# Patient Record
Sex: Male | Born: 1960
Health system: Southern US, Community
[De-identification: ages and names within clinical notes are randomized; demographics above are authoritative.]

## PROBLEM LIST (undated history)

## (undated) DIAGNOSIS — K922 Gastrointestinal hemorrhage, unspecified: Secondary | ICD-10-CM

## (undated) DIAGNOSIS — T8859XA Other complications of anesthesia, initial encounter: Secondary | ICD-10-CM

## (undated) DIAGNOSIS — M109 Gout, unspecified: Secondary | ICD-10-CM

## (undated) DIAGNOSIS — E7800 Pure hypercholesterolemia, unspecified: Secondary | ICD-10-CM

## (undated) DIAGNOSIS — I700 Atherosclerosis of aorta: Secondary | ICD-10-CM

## (undated) DIAGNOSIS — T7840XA Allergy, unspecified, initial encounter: Secondary | ICD-10-CM

## (undated) DIAGNOSIS — I2510 Atherosclerotic heart disease of native coronary artery without angina pectoris: Secondary | ICD-10-CM

## (undated) DIAGNOSIS — K5790 Diverticulosis of intestine, part unspecified, without perforation or abscess without bleeding: Secondary | ICD-10-CM

## (undated) HISTORY — DX: Gastrointestinal hemorrhage, unspecified: K92.2

## (undated) HISTORY — DX: Allergy, unspecified, initial encounter: T78.40XA

## (undated) HISTORY — DX: Diverticulosis of intestine, part unspecified, without perforation or abscess without bleeding: K57.90

## (undated) HISTORY — PX: ANKLE ARTHROSCOPY: SUR85

## (undated) HISTORY — PX: KNEE ARTHROSCOPY: SHX127

## (undated) HISTORY — DX: Atherosclerosis of aorta: I70.0

## (undated) HISTORY — PX: CARDIAC CATHETERIZATION: SHX172

## (undated) HISTORY — PX: KNEE ARTHROSCOPY: SUR90

---

## 2001-07-02 ENCOUNTER — Encounter: Payer: Self-pay | Admitting: Geriatric Medicine

## 2001-07-02 ENCOUNTER — Ambulatory Visit (HOSPITAL_COMMUNITY): Admission: RE | Admit: 2001-07-02 | Discharge: 2001-07-02 | Payer: Self-pay | Admitting: Geriatric Medicine

## 2004-09-03 ENCOUNTER — Ambulatory Visit: Payer: Self-pay

## 2004-09-03 ENCOUNTER — Ambulatory Visit: Payer: Self-pay | Admitting: Cardiology

## 2007-04-03 ENCOUNTER — Emergency Department (HOSPITAL_COMMUNITY): Admission: EM | Admit: 2007-04-03 | Discharge: 2007-04-03 | Payer: Self-pay | Admitting: Family Medicine

## 2010-12-16 ENCOUNTER — Inpatient Hospital Stay (INDEPENDENT_AMBULATORY_CARE_PROVIDER_SITE_OTHER)
Admission: RE | Admit: 2010-12-16 | Discharge: 2010-12-16 | Disposition: A | Discharge status: Home / Self Care | Payer: 59 | Source: Ambulatory Visit | Attending: Family Medicine | Admitting: Family Medicine

## 2010-12-16 DIAGNOSIS — S61209A Unspecified open wound of unspecified finger without damage to nail, initial encounter: Secondary | ICD-10-CM

## 2011-05-17 ENCOUNTER — Ambulatory Visit
Admission: RE | Admit: 2011-05-17 | Discharge: 2011-05-17 | Disposition: A | Discharge status: Home / Self Care | Payer: 59 | Source: Ambulatory Visit | Attending: Orthopedic Surgery | Admitting: Orthopedic Surgery

## 2011-05-17 ENCOUNTER — Other Ambulatory Visit: Payer: Self-pay | Admitting: Orthopedic Surgery

## 2011-05-17 DIAGNOSIS — M75100 Unspecified rotator cuff tear or rupture of unspecified shoulder, not specified as traumatic: Secondary | ICD-10-CM

## 2011-05-17 DIAGNOSIS — M25511 Pain in right shoulder: Secondary | ICD-10-CM

## 2012-11-05 ENCOUNTER — Emergency Department (HOSPITAL_COMMUNITY): Payer: 59

## 2012-11-05 ENCOUNTER — Encounter (HOSPITAL_COMMUNITY): Payer: Self-pay | Admitting: Emergency Medicine

## 2012-11-05 ENCOUNTER — Emergency Department (HOSPITAL_COMMUNITY)
Admission: EM | Admit: 2012-11-05 | Discharge: 2012-11-05 | Disposition: A | Discharge status: Home / Self Care | Payer: 59 | Attending: Emergency Medicine | Admitting: Emergency Medicine

## 2012-11-05 DIAGNOSIS — M109 Gout, unspecified: Secondary | ICD-10-CM | POA: Insufficient documentation

## 2012-11-05 DIAGNOSIS — M25579 Pain in unspecified ankle and joints of unspecified foot: Secondary | ICD-10-CM | POA: Insufficient documentation

## 2012-11-05 DIAGNOSIS — Z862 Personal history of diseases of the blood and blood-forming organs and certain disorders involving the immune mechanism: Secondary | ICD-10-CM | POA: Insufficient documentation

## 2012-11-05 DIAGNOSIS — Z8639 Personal history of other endocrine, nutritional and metabolic disease: Secondary | ICD-10-CM | POA: Insufficient documentation

## 2012-11-05 DIAGNOSIS — M25571 Pain in right ankle and joints of right foot: Secondary | ICD-10-CM

## 2012-11-05 HISTORY — DX: Gout, unspecified: M10.9

## 2012-11-05 HISTORY — DX: Pure hypercholesterolemia, unspecified: E78.00

## 2012-11-05 LAB — CBC WITH DIFFERENTIAL/PLATELET
Basophils Absolute: 0 10*3/uL (ref 0.0–0.1)
Basophils Relative: 0 % (ref 0–1)
Eosinophils Absolute: 0.1 10*3/uL (ref 0.0–0.7)
Eosinophils Relative: 1 % (ref 0–5)
HCT: 45.1 % (ref 39.0–52.0)
Hemoglobin: 16.2 g/dL (ref 13.0–17.0)
Lymphocytes Relative: 16 % (ref 12–46)
Lymphs Abs: 1.7 10*3/uL (ref 0.7–4.0)
MCH: 31 pg (ref 26.0–34.0)
MCHC: 35.9 g/dL (ref 30.0–36.0)
MCV: 86.2 fL (ref 78.0–100.0)
Monocytes Absolute: 0.8 10*3/uL (ref 0.1–1.0)
Monocytes Relative: 7 % (ref 3–12)
Neutro Abs: 8 10*3/uL — ABNORMAL HIGH (ref 1.7–7.7)
Neutrophils Relative %: 75 % (ref 43–77)
Platelets: 356 10*3/uL (ref 150–400)
RBC: 5.23 MIL/uL (ref 4.22–5.81)
RDW: 12.1 % (ref 11.5–15.5)
WBC: 10.6 10*3/uL — ABNORMAL HIGH (ref 4.0–10.5)

## 2012-11-05 LAB — BASIC METABOLIC PANEL
BUN: 15 mg/dL (ref 6–23)
Calcium: 9.7 mg/dL (ref 8.4–10.5)
GFR calc Af Amer: >90 mL/min (ref >90)
GFR calc non Af Amer: >90 mL/min (ref >90)
Glucose, Bld: 95 mg/dL (ref 70–99)

## 2012-11-05 LAB — URIC ACID: Uric Acid, Serum: 6.8 mg/dL (ref 4.0–7.8)

## 2012-11-05 MED ORDER — ONDANSETRON HCL 4 MG PO TABS: 4.0000 mg | ORAL_TABLET | Freq: Four times a day (QID) | ORAL | Status: DC

## 2012-11-05 MED ORDER — INDOMETHACIN 25 MG PO CAPS
50.0000 mg | ORAL_CAPSULE | Freq: Once | ORAL | Status: AC
  Administered 2012-11-05: 50 mg via ORAL
  Filled 2012-11-05: qty 2

## 2012-11-05 MED ORDER — INDOMETHACIN 25 MG PO CAPS: 50.0000 mg | ORAL_CAPSULE | Freq: Two times a day (BID) | ORAL | Status: DC

## 2012-11-05 MED ORDER — ONDANSETRON 4 MG PO TBDP
8.0000 mg | ORAL_TABLET | Freq: Once | ORAL | Status: AC
  Administered 2012-11-05: 8 mg via ORAL
  Filled 2012-11-05: qty 2

## 2012-11-05 NOTE — ED Provider Notes (Signed)
History     CSN: [id number]  Arrival date & time 11/05/12  0808   First MD Initiated Contact with Patient 11/05/12 0825      Chief Complaint  Patient presents with  . Foot Pain    (Consider location/radiation/quality/duration/timing/severity/associated sxs/prior treatment) HPI Comments: This is a 52 year old male who presents today with a week of right ankle pain. The pain has "peaks and valleys". This is the 3rd peak of the week. He has a past medical history of gout. He states this feels like gout. No history of trauma or any activity out of the ordinary. It is a deep throbbing pain in his ankle. He has tried taking Advil with has helped a little. He states taking a hot bath is what provides him with the most relief. Walking on his leg makes it worse and he is currently using crutches. No fever, chill, body aches, abdominal pain, nausea, vomiting, diarrhea, or headache. No pain in any other joint.   Patient is a 52 y.o. male presenting with lower extremity pain. The history is provided by the patient. No language interpreter was used.  Foot Pain This is a new problem. The current episode started in the past 7 days. The problem occurs constantly. The problem has been waxing and waning. Associated symptoms include joint swelling. Pertinent negatives include no abdominal pain, chest pain, coughing, diaphoresis, fever, headaches or numbness. The symptoms are aggravated by walking. He has tried NSAIDs, heat and ice for the symptoms. The treatment provided mild relief.    Past Medical History  Diagnosis Date  . Gout   . High cholesterol     Past Surgical History  Procedure Laterality Date  . Ankle arthroscopy      "had it cleaned out"  . Knee arthroscopy      History reviewed. No pertinent family history.  History  Substance Use Topics  . Smoking status: Never Smoker   . Smokeless tobacco: Not on file  . Alcohol Use: No      Review of Systems  Constitutional: Negative for  fever and diaphoresis.  Respiratory: Negative for cough.   Cardiovascular: Negative for chest pain.  Gastrointestinal: Negative for abdominal pain.  Musculoskeletal: Positive for joint swelling.  Neurological: Negative for numbness and headaches.    Allergies  Colchicine  Home Medications   Current Outpatient Rx  Name  Route  Sig  Dispense  Refill  . Acetaminophen (ACETAMIN PO)   Oral   Take 2 tablets by mouth every 6 (six) hours as needed (for pain).         Marland Kitchen ibuprofen (ADVIL,MOTRIN) 200 MG tablet   Oral   Take 800 mg by mouth every 6 (six) hours as needed for pain.           BP 123/74  Pulse 56  Temp(Src) 98.4 F (36.9 C)  Resp 16  SpO2 97%  Physical Exam  Nursing note and vitals reviewed. Constitutional: He is oriented to person, place, and time. Vital signs are normal. He appears well-developed and well-nourished. He is cooperative. He does not appear ill. No distress.  HENT:  Head: Normocephalic and atraumatic.  Right Ear: External ear normal.  Left Ear: External ear normal.  Nose: Nose normal.  Eyes: Conjunctivae and lids are normal.  Neck: Trachea normal, normal range of motion and phonation normal. No tracheal deviation present.  Cardiovascular: Normal rate, regular rhythm, normal heart sounds, intact distal pulses and normal pulses.  Exam reveals no gallop and no  friction rub.   No murmur heard. Pulmonary/Chest: Effort normal and breath sounds normal. No stridor. No respiratory distress.  Abdominal: Soft. Normal appearance. He exhibits no distension. There is no tenderness.  Musculoskeletal: He exhibits tenderness.       Right ankle: He exhibits decreased range of motion and swelling. He exhibits no ecchymosis, no deformity, no laceration and normal pulse. Tenderness (posterior to lateral malleolus ). Achilles tendon exhibits no defect and normal Thompson's test results.  Neurological: He is alert and oriented to person, place, and time. Coordination  normal.  Skin: Skin is warm and dry. He is not diaphoretic. No erythema.  Psychiatric: He has a normal mood and affect. His behavior is normal.    ED Course  Procedures (including critical care time)  Labs Reviewed  CBC WITH DIFFERENTIAL - Abnormal; Notable for the following:    WBC 10.6 (*)    Neutro Abs 8.0 (*)    All other components within normal limits  URIC ACID  BASIC METABOLIC PANEL   Dg Ankle Complete Right  11/05/2012  *RADIOLOGY REPORT*  Clinical Data: Foot pain  RIGHT ANKLE - COMPLETE 3+ VIEW  Comparison: None.  Findings: Four views of the right ankle submitted.  No acute fracture or subluxation.  Ankle mortise is preserved.  Mild spurring of the dorsal aspect and posterior aspect of the talus.  IMPRESSION: No acute fracture or subluxation.  Mild degenerative changes.   Original Report Authenticated By: Natasha Mead, M.D.      1. Ankle pain, right       MDM  Patient presents with 9/10 throbbing ankle pain. No trauma. Warm bath helps. Hx of gout - states it feels like his gout. XRay neg for acute pathology. CBC, BMP, uric acid WNL. Reassessed and pain was improved with indomethacin. Patient denied all narcotic pain medications. No concern for septic arthritis. Suspect gout. Sent home with indomethacin and told to follow up with PCP in the next 2 days. Return precautions given. Patient agrees with plan.        Mora Bellman, PA-C 11/05/12 [phone]

## 2012-11-05 NOTE — ED Notes (Signed)
Has hx gout type symptoms -- last Friday right ankle started hurting and swelling-- had gotten better, but got worse last night. Right ankle red/hot to touch

## 2012-11-06 NOTE — ED Provider Notes (Signed)
Medical screening examination/treatment/procedure(s) were performed by non-physician practitioner and as supervising physician I was immediately available for consultation/collaboration.   Shelda Jakes, MD 11/06/12 [phone]

## 2013-08-20 ENCOUNTER — Encounter: Payer: Self-pay | Admitting: Internal Medicine

## 2013-09-27 ENCOUNTER — Ambulatory Visit (AMBULATORY_SURGERY_CENTER): Payer: Self-pay

## 2013-09-27 VITALS — Ht 71.0 in | Wt 195.0 lb

## 2013-09-27 DIAGNOSIS — Z1211 Encounter for screening for malignant neoplasm of colon: Secondary | ICD-10-CM

## 2013-09-27 MED ORDER — MOVIPREP 100 G PO SOLR: 1.0000 | Freq: Once | ORAL | Status: DC

## 2013-10-02 ENCOUNTER — Encounter: Payer: Self-pay | Admitting: Internal Medicine

## 2013-10-11 ENCOUNTER — Encounter: Payer: 59 | Admitting: Internal Medicine

## 2013-10-19 ENCOUNTER — Encounter: Payer: 59 | Admitting: Internal Medicine

## 2013-10-24 ENCOUNTER — Encounter: Payer: 59 | Admitting: Internal Medicine

## 2013-12-05 ENCOUNTER — Encounter: Payer: 59 | Admitting: Internal Medicine

## 2014-05-03 ENCOUNTER — Encounter: Payer: 59 | Admitting: Internal Medicine

## 2014-09-04 ENCOUNTER — Telehealth: Payer: Self-pay | Admitting: *Deleted

## 2014-09-04 NOTE — Telephone Encounter (Signed)
[place] Night - Client TELEPHONE ADVICE RECORD [facility] Patient Name: Kevin Wallace Gender: Male DOB: [birth year] Age: 54 Y 1 M 6 D Return Phone Number: [phone] (Primary) Address: City/State/Zip: [place] [employer] [facility] [place] Night - Client Client Site [place] - Night Contact Type Call Caller Name [place] Phone Number [phone] Relationship To Patient Self Is this call to report lab results? No Call Type General Information Initial Comment Caller states that he will not be able to make it his appoinment tomorrow because he is sick. General Information Type Appointment Nurse Assessment Guidelines Guideline Title Affirmed Question Affirmed Notes Nurse Date/Time (Eastern Time) Disp. Time Eilene Ghazi Time) Disposition Final User 09/03/2014 7:34:57 PM General Information Provided Yes Jinger Neighbors After Care Instructions Given Call Event Type User Date / Time Description

## 2014-09-12 ENCOUNTER — Ambulatory Visit (AMBULATORY_SURGERY_CENTER): Payer: Self-pay

## 2014-09-12 VITALS — Ht 71.0 in | Wt 197.0 lb

## 2014-09-12 DIAGNOSIS — Z1211 Encounter for screening for malignant neoplasm of colon: Secondary | ICD-10-CM

## 2014-09-12 NOTE — Progress Notes (Signed)
No allergies to eggs or soy n o past problems with anesthesia (took a long time to wake up after arthroscopy) No home oxygen No diet/weight loss meds  Has email  Emmi instructions given for colonoscopy

## 2014-09-24 ENCOUNTER — Encounter: Payer: 59 | Admitting: Internal Medicine

## 2014-10-14 ENCOUNTER — Encounter: Payer: Self-pay | Admitting: Internal Medicine

## 2014-10-14 ENCOUNTER — Ambulatory Visit (AMBULATORY_SURGERY_CENTER): Payer: 59 | Admitting: Internal Medicine

## 2014-10-14 VITALS — BP 121/72 | HR 47 | Temp 97.7°F | Resp 18 | Ht 71.0 in | Wt 197.0 lb

## 2014-10-14 DIAGNOSIS — D123 Benign neoplasm of transverse colon: Secondary | ICD-10-CM

## 2014-10-14 DIAGNOSIS — Z1211 Encounter for screening for malignant neoplasm of colon: Secondary | ICD-10-CM

## 2014-10-14 HISTORY — PX: COLONOSCOPY: SHX174

## 2014-10-14 MED ORDER — SODIUM CHLORIDE 0.9 % IV SOLN: 500.0000 mL | INTRAVENOUS | Status: DC

## 2014-10-14 NOTE — Progress Notes (Signed)
Report to PACU, RN, vss, BBS= Clear.  

## 2014-10-14 NOTE — Op Note (Signed)
[place]  [address] [place], [id number]   COLONOSCOPY PROCEDURE REPORT  PATIENT: Kevin Wallace, Kevin Wallace  MR#: [id number] BIRTHDATE: 01/07/1961 , 53  yrs. old GENDER: male ENDOSCOPIST: Jerene Bears, MD REFERRED BY:W.  Lutricia Feil, M.D. PROCEDURE DATE:  10/14/2014 PROCEDURE:   Colonoscopy with snare polypectomy First Screening Colonoscopy - Avg.  risk and is 50 yrs.  old or older Yes.  Prior Negative Screening - Now for repeat screening. N/A  History of Adenoma - Now for follow-up colonoscopy & has been > or = to 3 yrs.  N/A  Polyps Removed Today? Yes. ASA CLASS:   Class II INDICATIONS:average risk patient for colon cancer and 1st colonoscopy. MEDICATIONS: Monitored anesthesia care and Propofol 200 mg IV  DESCRIPTION OF PROCEDURE:   After the risks benefits and alternatives of the procedure were thoroughly explained, informed consent was obtained.  The digital rectal exam revealed no rectal mass.   The LB [id number]  endoscope was introduced through the anus and advanced to the cecum, which was identified by both the appendix and ileocecal valve. No adverse events experienced. The quality of the prep was good, using MoviPrep  The instrument was then slowly withdrawn as the colon was fully examined.   COLON FINDINGS: A sessile polyp measuring 5 mm in size was found in the transverse colon.  A polypectomy was performed with a cold snare.  The resection was complete, the polyp tissue was completely retrieved and sent to histology.   There was moderate to severe diverticulosis noted throughout the entire examined colon most significant in the left colon with associated muscular hypertrophy. Retroflexed views revealed small internal hemorrhoids. The time to cecum=4 minutes 17 seconds.  Withdrawal time=11 minutes 04 seconds. The scope was withdrawn and the procedure completed. COMPLICATIONS: There were no immediate complications.  ENDOSCOPIC IMPRESSION: 1.    Sessile polyp was found in the transverse colon; polypectomy was performed with a cold snare 2.   There was moderate diverticulosis noted throughout the entire examined colon  RECOMMENDATIONS: 1.  Await pathology results 2.  High fiber diet 3.  If the polyp removed today is proven to be an adenomatous (pre-cancerous) polyp, you will need a repeat colonoscopy in 5 years.  Otherwise you should continue to follow colorectal cancer screening guidelines for "routine risk" patients with colonoscopy in 10 years.  You will receive a letter within 1-2 weeks with the results of your biopsy as well as final recommendations.  Please call my office if you have not received a letter after 3 weeks.  eSigned:  Jerene Bears, MD 10/14/2014 9:58 AM  cc: Janalyn Rouse, MD and The Patient

## 2014-10-14 NOTE — Patient Instructions (Signed)
YOU HAD AN ENDOSCOPIC PROCEDURE TODAY AT [facility]:   Refer to the procedure report that was given to you for any specific questions about what was found during the examination.  If the procedure report does not answer your questions, please call your gastroenterologist to clarify.  If you requested that your care partner not be given the details of your procedure findings, then the procedure report has been included in a sealed envelope for you to review at your convenience later.  YOU SHOULD EXPECT: Some feelings of bloating in the abdomen. Passage of more gas than usual.  Walking can help get rid of the air that was put into your GI tract during the procedure and reduce the bloating. If you had a lower endoscopy (such as a colonoscopy or flexible sigmoidoscopy) you may notice spotting of blood in your stool or on the toilet paper. If you underwent a bowel prep for your procedure, you may not have a normal bowel movement for a few days.  Please Note:  You might notice some irritation and congestion in your nose or some drainage.  This is from the oxygen used during your procedure.  There is no need for concern and it should clear up in a day or so.  SYMPTOMS TO REPORT IMMEDIATELY:   Following lower endoscopy (colonoscopy or flexible sigmoidoscopy):  Excessive amounts of blood in the stool  Significant tenderness or worsening of abdominal pains  Swelling of the abdomen that is new, acute  Fever of 100F or higher   Following upper endoscopy (EGD)  Vomiting of blood or coffee ground material  New chest pain or pain under the shoulder blades  Painful or persistently difficult swallowing  New shortness of breath  Fever of 100F or higher  Black, tarry-looking stools  A gastroenterologist can be reached at any hour by calling [phone].   DIET: Your first meal following the procedure should be a small meal and then it is ok to progress to your normal diet. Heavy or  fried foods are harder to digest and may make you feel nauseous or bloated.  Likewise, meals heavy in dairy and vegetables can increase bloating.  Drink plenty of fluids but you should avoid alcoholic beverages for 24 hours.  ACTIVITY:  You should plan to take it easy for the rest of today and you should NOT DRIVE or use heavy machinery until tomorrow (because of the sedation medicines used during the test).    FOLLOW UP: Our staff will call the number listed on your records the next business day following your procedure to check on you and address any questions or concerns that you may have regarding the information given to you following your procedure. If we do not reach you, we will leave a message.  However, if you are feeling well and you are not experiencing any problems, there is no need to return our call.  We will assume that you have returned to your regular daily activities without incident.  If any biopsies were taken you will be contacted by phone or by letter within the next 1-3 weeks.  Please call us at [phone] if you have not heard about the biopsies in 3 weeks.    SIGNATURES/CONFIDENTIALITY: You and/or your care partner have signed paperwork which will be entered into your electronic medical record.  These signatures attest to the fact that that the information above on your After Visit Summary has been reviewed and is understood.  Full  responsibility of the confidentiality of this discharge information lies with you and/or your care-partner.YOU HAD AN ENDOSCOPIC PROCEDURE TODAY AT [facility]:   Refer to the procedure report that was given to you for any specific questions about what was found during the examination.  If the procedure report does not answer your questions, please call your gastroenterologist to clarify.  If you requested that your care partner not be given the details of your procedure findings, then the procedure report has been included in a  sealed envelope for you to review at your convenience later.  YOU SHOULD EXPECT: Some feelings of bloating in the abdomen. Passage of more gas than usual.  Walking can help get rid of the air that was put into your GI tract during the procedure and reduce the bloating. If you had a lower endoscopy (such as a colonoscopy or flexible sigmoidoscopy) you may notice spotting of blood in your stool or on the toilet paper. If you underwent a bowel prep for your procedure, you may not have a normal bowel movement for a few days.  Please Note:  You might notice some irritation and congestion in your nose or some drainage.  This is from the oxygen used during your procedure.  There is no need for concern and it should clear up in a day or so.  SYMPTOMS TO REPORT IMMEDIATELY:   Following lower endoscopy (colonoscopy or flexible sigmoidoscopy):  Excessive amounts of blood in the stool  Significant tenderness or worsening of abdominal pains  Swelling of the abdomen that is new, acute  Fever of 100F or higher    A gastroenterologist can be reached at any hour by calling [phone].   DIET: Your first meal following the procedure should be a small meal and then it is ok to progress to your normal diet. Heavy or fried foods are harder to digest and may make you feel nauseous or bloated.  Likewise, meals heavy in dairy and vegetables can increase bloating.  Drink plenty of fluids but you should avoid alcoholic beverages for 24 hours.  ACTIVITY:  You should plan to take it easy for the rest of today and you should NOT DRIVE or use heavy machinery until tomorrow (because of the sedation medicines used during the test).    FOLLOW UP: Our staff will call the number listed on your records the next business day following your procedure to check on you and address any questions or concerns that you may have regarding the information given to you following your procedure. If we do not reach you, we will leave a  message.  However, if you are feeling well and you are not experiencing any problems, there is no need to return our call.  We will assume that you have returned to your regular daily activities without incident.  If any biopsies were taken you will be contacted by phone or by letter within the next 1-3 weeks.  Please call us at [phone] if you have not heard about the biopsies in 3 weeks.    SIGNATURES/CONFIDENTIALITY: You and/or your care partner have signed paperwork which will be entered into your electronic medical record.  These signatures attest to the fact that that the information above on your After Visit Summary has been reviewed and is understood.  Full responsibility of the confidentiality of this discharge information lies with you and/or your care-partner.  Polyps, diverticulosis, and high fiber diet information given.

## 2014-10-14 NOTE — Progress Notes (Signed)
Called to room to assist during endoscopic procedure.  Patient ID and intended procedure confirmed with present staff. Received instructions for my participation in the procedure from the performing physician.  

## 2014-10-15 ENCOUNTER — Telehealth: Payer: Self-pay | Admitting: *Deleted

## 2014-10-15 NOTE — Telephone Encounter (Signed)
  Follow up Call-  Call back number 10/14/2014  Post procedure Call Back phone  # [phone]  Permission to leave phone message Yes     Patient questions:  Do you have a fever, pain , or abdominal swelling? No. Pain Score  0 *  Have you tolerated food without any problems? Yes.    Have you been able to return to your normal activities? Yes.    Do you have any questions about your discharge instructions: Diet   No. Medications  No. Follow up visit  No.  Do you have questions or concerns about your Care? No.  Actions: * If pain score is 4 or above: No action needed, pain <4.

## 2014-10-21 ENCOUNTER — Encounter: Payer: Self-pay | Admitting: Internal Medicine

## 2014-10-23 ENCOUNTER — Telehealth: Payer: Self-pay | Admitting: *Deleted

## 2014-10-24 NOTE — Telephone Encounter (Signed)
Entered in error

## 2015-05-20 ENCOUNTER — Telehealth: Payer: Self-pay | Admitting: Cardiology

## 2015-05-20 NOTE — Telephone Encounter (Signed)
Received records from [facility] for appointment on 06/16/15 with Dr Percival Spanish.  Records given to [facility] (medical records) for Dr Hochrein's schedule on 06/16/15. lp

## 2015-06-16 ENCOUNTER — Encounter: Payer: Self-pay | Admitting: Cardiology

## 2015-06-16 ENCOUNTER — Ambulatory Visit (INDEPENDENT_AMBULATORY_CARE_PROVIDER_SITE_OTHER): Payer: 59 | Admitting: Cardiology

## 2015-06-16 VITALS — BP 126/94 | HR 57 | Ht 71.0 in | Wt 199.7 lb

## 2015-06-16 DIAGNOSIS — R079 Chest pain, unspecified: Secondary | ICD-10-CM | POA: Diagnosis not present

## 2015-06-16 NOTE — Patient Instructions (Signed)
Your physician recommends that you schedule a follow-up appointment in: 1 Month  Your physician recommends that you have a Coronary Calcium Scoring Test this is done at our [facility]

## 2015-06-16 NOTE — Progress Notes (Signed)
Cardiology Office Note   Date:  06/16/2015   ID:  Kevin Wallace, DOB March 01, 1961, MRN [id number]  PCP:  Marton Redwood, MD  Cardiologist:   Minus Breeding, MD   Chief Complaint  Patient presents with  . Chest Pain      History of Present Illness: Kevin Wallace is a 54 y.o. male who presents for evaluation of chest pain. He reports that this has been going on for about 2 months. It is sporadic.  It has been stable pattern.  He says this is a 4 out of 10 discomfort. Might last for 5-10 minutes. He had bring it on with activities such as pushing his self propelled lawnmower. There are no associated symptoms such as nausea vomiting shortness of breath or diaphoresis. There is no radiation. He's never had this discomfort for. It comes and goes spontaneously. 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 He might do a little bit of walking. He walks when he coughs. He can bring on the symptoms.   Past Medical History  Diagnosis Date  . Gout   . High cholesterol     diet controlled    Past Surgical History  Procedure Laterality Date  . Ankle arthroscopy      "had it cleaned out"  . Knee arthroscopy       Current Outpatient Prescriptions  Medication Sig Dispense Refill  . Acetaminophen (ACETAMIN PO) Take 2 tablets by mouth every 6 (six) hours as needed (for pain).    Marland Kitchen ibuprofen (ADVIL,MOTRIN) 200 MG tablet Take 800 mg by mouth every 6 (six) hours as needed for pain.     No current facility-administered medications for this visit.    Allergies:   Colchicine    Social History:  The patient  reports that he has never smoked. He has never used smokeless tobacco. He reports that he does not drink alcohol or use illicit drugs.   Family History:  The patient's family history includes Rheumatic fever (age of onset: 65) in his father. There is no history of Colon cancer.    ROS:  Please see the history of present illness.   Otherwise, review of systems are  positive for fatigue and headaches.   All other systems are reviewed and negative.    PHYSICAL EXAM: VS:  BP 126/94 mmHg  Pulse 57  Ht 5\' 11"  (1.803 m)  Wt 199 lb 11.2 oz (90.583 kg)  BMI 27.86 kg/m2 , BMI Body mass index is 27.86 kg/(m^2). GENERAL:  Well appearing HEENT:  Pupils equal round and reactive, fundi not visualized, oral mucosa unremarkable NECK:  No jugular venous distention, waveform within normal limits, carotid upstroke brisk and symmetric, no bruits, no thyromegaly LYMPHATICS:  No cervical, inguinal adenopathy LUNGS:  Clear to auscultation bilaterally BACK:  No CVA tenderness CHEST:  Unremarkable HEART:  PMI not displaced or sustained,S1 and S2 within normal limits, no S3, no S4, no clicks, no rubs, no murmurs ABD:  Flat, positive bowel sounds normal in frequency in pitch, no bruits, no rebound, no guarding, no midline pulsatile mass, no hepatomegaly, no splenomegaly EXT:  2 plus pulses throughout, no edema, no cyanosis no clubbing SKIN:  No rashes no nodules NEURO:  Cranial nerves II through XII grossly intact, motor grossly intact throughout PSYCH:  Cognitively intact, oriented to person place and time    EKG:  EKG is ordered today. The ekg ordered today demonstrates sinus rhythm, rate 57, axis within normal limits, intervals  within normal limits, no acute ST-T wave changes.   Recent Labs: No results found for requested labs within last 365 days.    Lipid Panel No results found for: CHOL, TRIG, HDL, CHOLHDL, VLDL, LDLCALC, LDLDIRECT    Wt Readings from Last 3 Encounters:  06/16/15 199 lb 11.2 oz (90.583 kg)  10/14/14 197 lb (89.359 kg)  09/12/14 197 lb (89.359 kg)      Other studies Reviewed: Additional studies/ records that were reviewed today include: Office records with labs. Review of the above records demonstrates:  Please see elsewhere in the note.     ASSESSMENT AND PLAN:  CHEST PAIN:  I'm going to start with a coronary calcium score. If he  has elevated coronary calcium score he will at least get a stress perfusion study or possibly coronary CT angiogram. A lower score and I will risk stratify with a POET (Plain Old Exercise Treadmill)  DYSLIPIDEMIA:  His LDL was 157. HDL 42. Further therapy will be based on the results of coronary calcium score other workup.  RISK REDUCTION: We had a long discussion about exercise and diet.   Current medicines are reviewed at length with the patient today.  The patient does not have concerns regarding medicines.  The following changes have been made:  no change  Labs/ tests ordered today include:   Orders Placed This Encounter  Procedures  . CT Cardiac Scoring  . EKG 12-Lead     Disposition:   FU with me in about one mont.     Signed, Minus Breeding, MD  06/16/2015 8:29 AM     [facility]

## 2015-06-26 ENCOUNTER — Ambulatory Visit (INDEPENDENT_AMBULATORY_CARE_PROVIDER_SITE_OTHER)
Admission: RE | Admit: 2015-06-26 | Discharge: 2015-06-26 | Disposition: A | Discharge status: Home / Self Care | Payer: Self-pay | Source: Ambulatory Visit | Attending: Cardiology | Admitting: Cardiology

## 2015-06-26 DIAGNOSIS — R079 Chest pain, unspecified: Secondary | ICD-10-CM

## 2015-06-30 ENCOUNTER — Telehealth: Payer: Self-pay | Admitting: *Deleted

## 2015-06-30 DIAGNOSIS — R072 Precordial pain: Secondary | ICD-10-CM

## 2015-06-30 NOTE — Telephone Encounter (Signed)
-----   Message from Minus Breeding, MD sent at 06/27/2015 12:55 PM EST ----- Coronary calcium score was zero.  I am however going to order a POET (Plain Old Exercise Treadmill).  Call Mr. Diguglielmo with the results and send results to Marton Redwood, MD

## 2015-06-30 NOTE — Telephone Encounter (Signed)
Spoke with pt about his Coronary Calcium Score test, pt voice understanding  Order for GXT was place    Result send to pt PCP via epic

## 2015-07-01 ENCOUNTER — Telehealth (HOSPITAL_COMMUNITY): Payer: Self-pay

## 2015-07-01 NOTE — Telephone Encounter (Signed)
Encounter complete. 

## 2015-07-03 ENCOUNTER — Ambulatory Visit (HOSPITAL_COMMUNITY)
Admission: RE | Admit: 2015-07-03 | Discharge: 2015-07-03 | Disposition: A | Discharge status: Home / Self Care | Payer: 59 | Source: Ambulatory Visit | Attending: Cardiology | Admitting: Cardiology

## 2015-07-03 DIAGNOSIS — R072 Precordial pain: Secondary | ICD-10-CM | POA: Diagnosis present

## 2015-07-03 LAB — EXERCISE TOLERANCE TEST
CHL CUP MPHR: 167 {beats}/min
CHL CUP RESTING HR STRESS: 71 {beats}/min
CHL RATE OF PERCEIVED EXERTION: 16
CSEPHR: 99 %
Estimated workload: 13.4 METS
Exercise duration (min): 12 min
Peak HR: 166 {beats}/min

## 2015-07-08 ENCOUNTER — Telehealth: Payer: Self-pay | Admitting: Cardiology

## 2015-07-08 NOTE — Telephone Encounter (Signed)
Returning a call from today,he did not know who called him.

## 2015-07-08 NOTE — Telephone Encounter (Signed)
Results discussed, OV next week discussed. Pt verb'd understanding.

## 2015-07-14 ENCOUNTER — Ambulatory Visit (INDEPENDENT_AMBULATORY_CARE_PROVIDER_SITE_OTHER): Payer: 59 | Admitting: Cardiology

## 2015-07-14 ENCOUNTER — Encounter: Payer: Self-pay | Admitting: Cardiology

## 2015-07-14 VITALS — BP 122/80 | HR 52 | Ht 71.0 in | Wt 203.3 lb

## 2015-07-14 DIAGNOSIS — R079 Chest pain, unspecified: Secondary | ICD-10-CM | POA: Insufficient documentation

## 2015-07-14 DIAGNOSIS — R0789 Other chest pain: Secondary | ICD-10-CM

## 2015-07-14 NOTE — Patient Instructions (Signed)
Your physician recommends that you schedule a follow-up appointment in: As Needed    

## 2015-07-14 NOTE — Progress Notes (Signed)
Cardiology Office Note   Date:  07/14/2015   ID:  Kevin Wallace, DOB 1961/06/08, MRN [id number]  PCP:  Marton Redwood, MD  Cardiologist:   Minus Breeding, MD   No chief complaint on file.     History of Present Illness: Kevin Wallace is a 54 y.o. male who presents for evaluation of chest pain.  This was described in previous note. He's not really getting this any longer. He did have a  POET (Plain Old Exercise Treadmill) which was negative for any evidence of ischemia. 0 calcium on his calcium score. Bring him back to discuss his symptoms and also to discuss a significant hypertensive response he had with peak exercise which was 12 minutes into the test at the beginning of stage IV.   Past Medical History  Diagnosis Date  . Gout   . High cholesterol     diet controlled    Past Surgical History  Procedure Laterality Date  . Ankle arthroscopy      "had it cleaned out"  . Knee arthroscopy       Current Outpatient Prescriptions  Medication Sig Dispense Refill  . Acetaminophen (ACETAMIN PO) Take 2 tablets by mouth every 6 (six) hours as needed (for pain).    Marland Kitchen ibuprofen (ADVIL,MOTRIN) 200 MG tablet Take 800 mg by mouth every 6 (six) hours as needed for pain.     No current facility-administered medications for this visit.    Allergies:   Colchicine    ROS:  Please see the history of present illness.   Otherwise, review of systems are positive for fatigue and headaches.   All other systems are reviewed and negative.    PHYSICAL EXAM: VS:  BP 122/80 mmHg  Pulse 52  Ht 5\' 11"  (1.803 m)  Wt 203 lb 5 oz (92.222 kg)  BMI 28.37 kg/m2  SpO2 98% , BMI Body mass index is 28.37 kg/(m^2). GENERAL:  Well appearing NECK:  No jugular venous distention, waveform within normal limits, carotid upstroke brisk and symmetric, no bruits, no thyromegaly LUNGS:  Clear to auscultation bilaterally BACK:  No CVA tenderness CHEST:  Unremarkable HEART:  PMI not displaced or sustained,S1  and S2 within normal limits, no S3, no S4, no clicks, no rubs, no murmurs ABD:  Flat, positive bowel sounds normal in frequency in pitch, no bruits, no rebound, no guarding, no midline pulsatile mass, no hepatomegaly, no splenomegaly EXT:  2 plus pulses throughout, no edema, no cyanosis no clubbing   EKG:  EKG is not ordered today.   Recent Labs: No results found for requested labs within last 365 days.    Lipid Panel No results found for: CHOL, TRIG, HDL, CHOLHDL, VLDL, LDLCALC, LDLDIRECT    Wt Readings from Last 3 Encounters:  07/14/15 203 lb 5 oz (92.222 kg)  06/16/15 199 lb 11.2 oz (90.583 kg)  10/14/14 197 lb (89.359 kg)      Other studies Reviewed: Additional studies/ records that were reviewed today include: POET (Plain Old Exercise Treadmill) Review of the above records demonstrates:  Please see elsewhere in the note.     ASSESSMENT AND PLAN:  CHEST PAIN:  There is no evidence for obstructive coronary disease. He will continue with primary risk reduction.  DYSLIPIDEMIA:  His LDL was 157. HDL 42. Further therapy will be based on the results of coronary calcium score other workup.  At this point there is no indication for medication therapy. We discussed this.  HTN:  We discussed his response  to the treadmill. At the point where his blood pressure was at highest working out significantly and also had some ankle pain. I don't think that this is typically a problem and I would not suggest medical therapy for his blood pressure at this point.  RISK REDUCTION: We again had a long discussion about exercise and diet.     Current medicines are reviewed at length with the patient today.  The patient does not have concerns regarding medicines.  The following changes have been made:  no change  Labs/ tests ordered today include:   None   Disposition:   FU with me as needed.    Signed, Minus Breeding, MD  07/14/2015 8:40 AM    [facility]

## 2015-09-11 ENCOUNTER — Ambulatory Visit: Payer: 59 | Admitting: Podiatry

## 2017-03-31 DIAGNOSIS — K5792 Diverticulitis of intestine, part unspecified, without perforation or abscess without bleeding: Secondary | ICD-10-CM | POA: Diagnosis not present

## 2017-05-07 IMAGING — CT CT HEART SCORING
1 of 3 series · 11 of 20 positions shown, 14 images · non-contrast
Comparison: none

ADDENDUM:
CLINICAL DATA: Personal history of chest pain. Father with heart
failure.
TECHNIQUE: The patient was scanned on a Siemens Sensation 16 slice scanner.
Axial non-contrast 3mm slices were carried out through the heart.
The data set was analyzed on a dedicated work station and scored
using the Agatson method.

[Series 6: st thins for reformat · axial · 0.76mm/px · z∈[-268,-120]mm · 11 of 178 slices shown, 14 images]
[im 15/178  vessel]
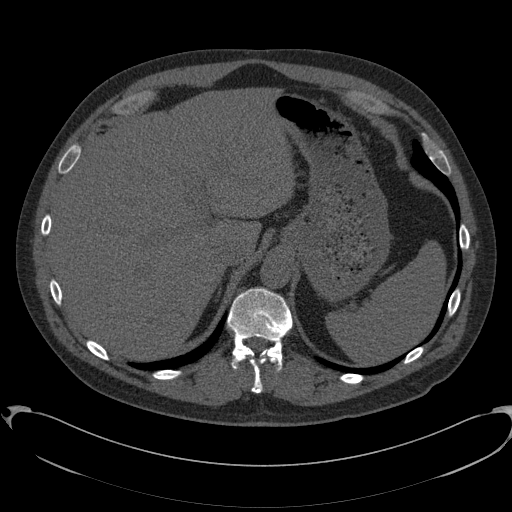
[im 15/178  lung]
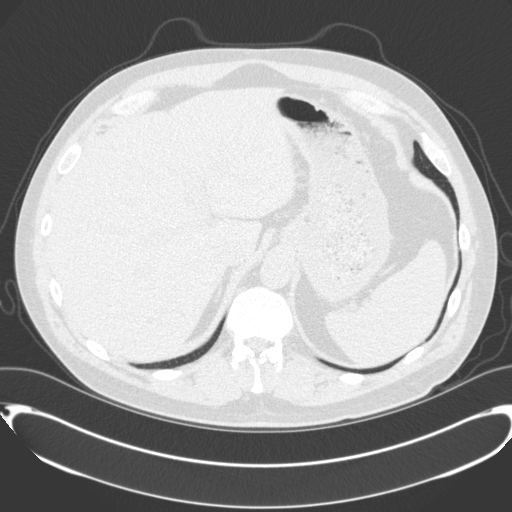
[im 30/178  vessel]
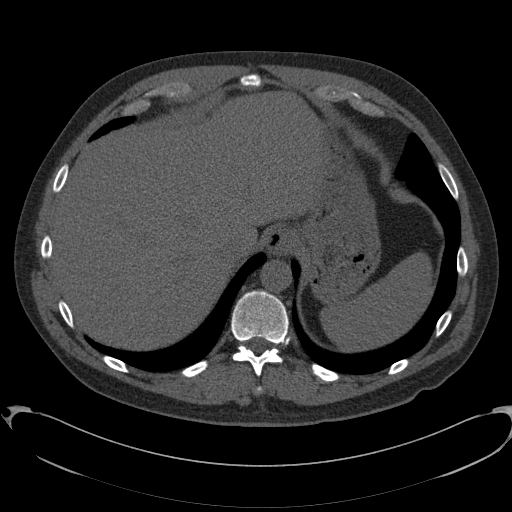
[im 45/178  vessel]
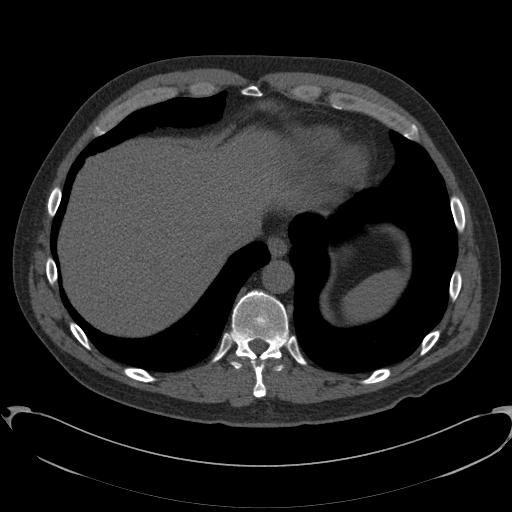
[im 60/178  vessel]
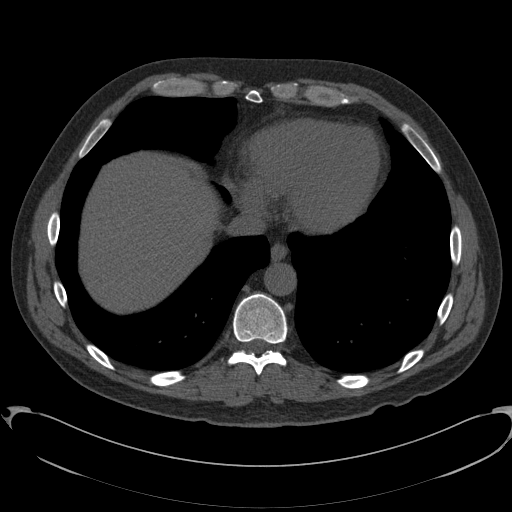
[im 74/178  vessel]
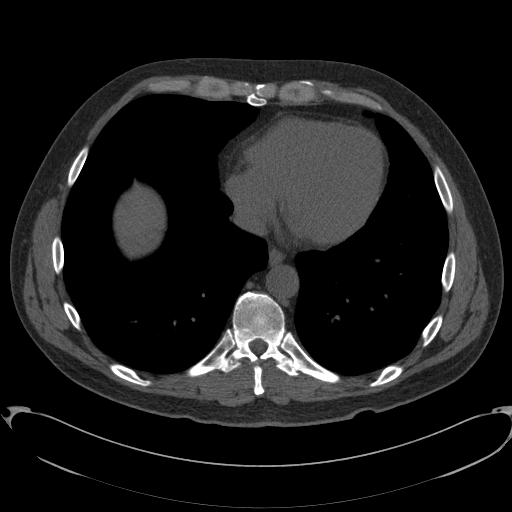
[im 74/178  lung]
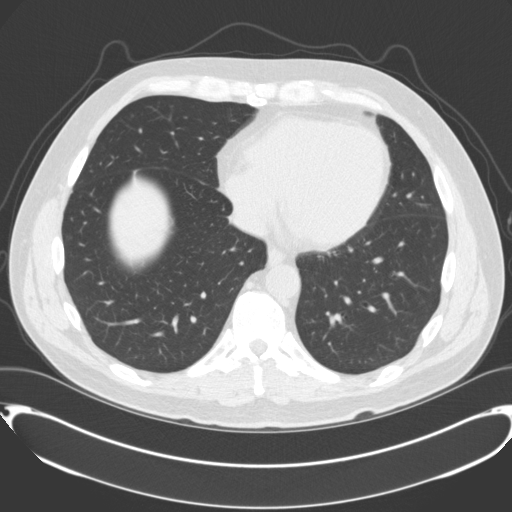
[im 89/178  vessel]
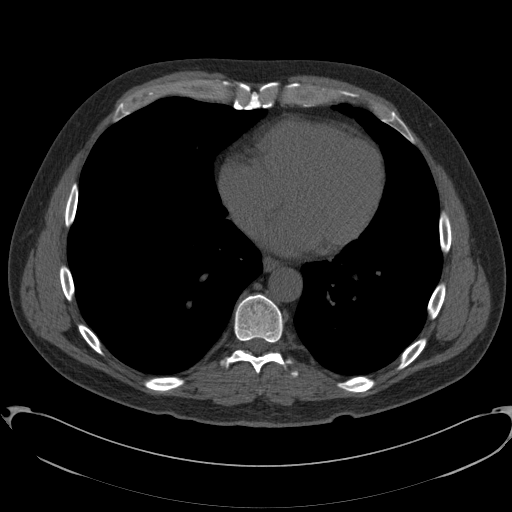
[im 104/178  vessel]
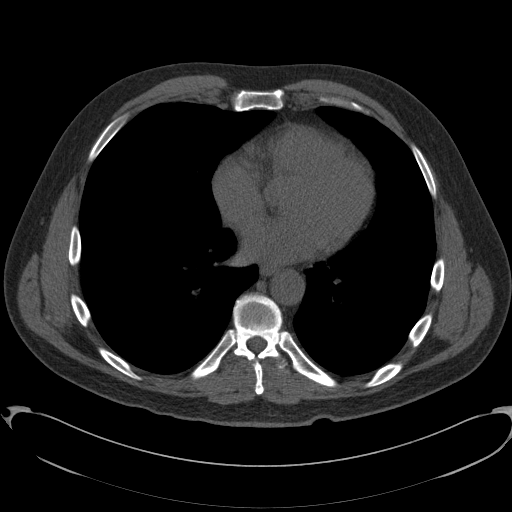
[im 119/178  vessel]
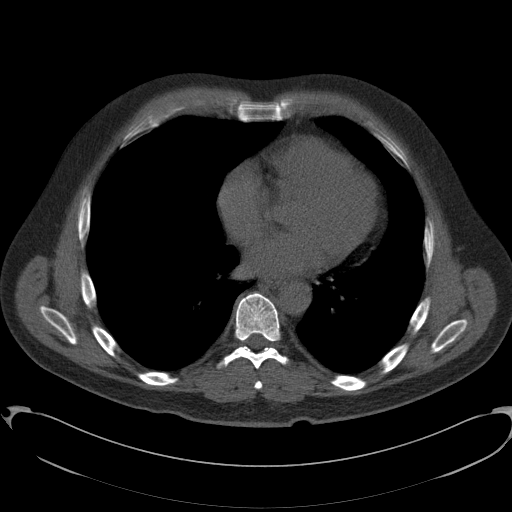
[im 133/178  vessel]
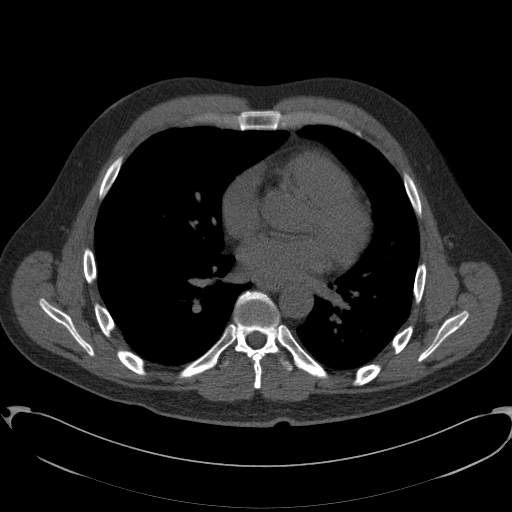
[im 133/178  lung]
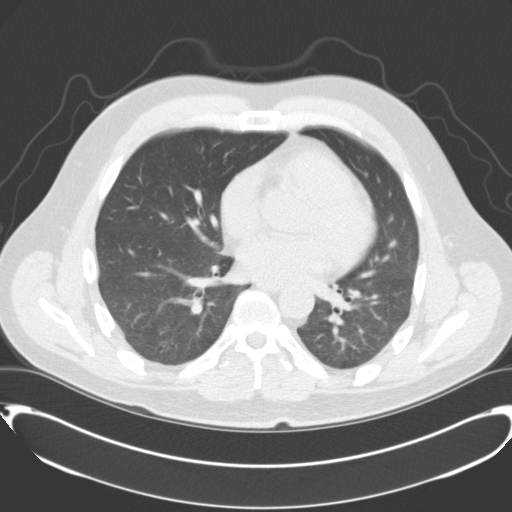
[im 148/178  vessel]
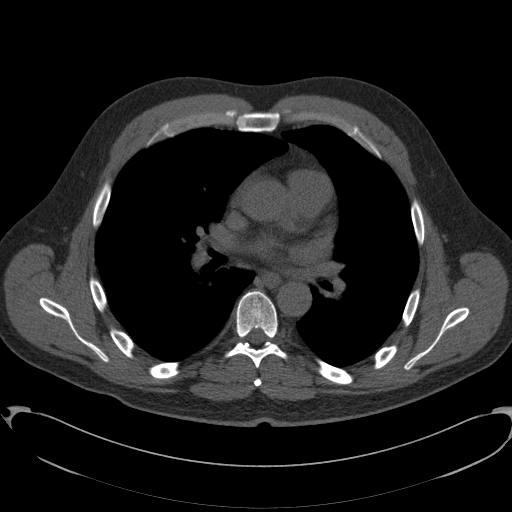
[im 163/178  vessel]
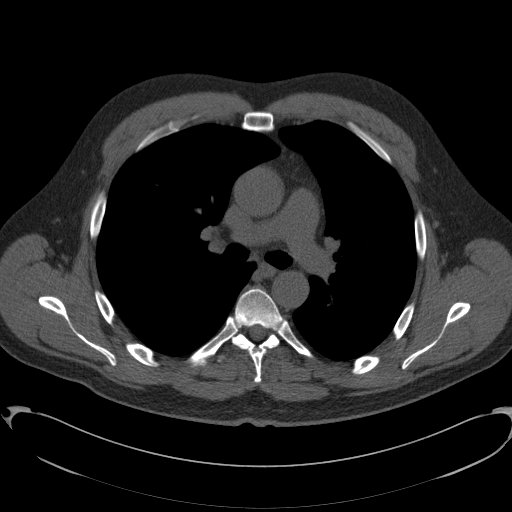

[11 of 20 positions shown; findings below may reference images not displayed]

FINDINGS: Mediastinum: The visualized portions of the lower airway and
esophagus appear normal no adenopathy identified

Lungs/Pleura: No pleural fluid identified.

Upper Abdomen: The visualized portions of the liver and spleen are
unremarkable.

Musculoskeletal: Negative.
IMPRESSION: 1. Unremarkable over read.

:
Risk stratification

EXAM:
Coronary Calcium Score
FINDINGS: Non-cardiac: No significant non cardiac findings on limited lung and
soft tissue windows. See separate report from [REDACTED].

Ascending Aorta:  3.3 cm

Pericardium: Normal

Coronary arteries:  No coronary artery calcium detected
IMPRESSION: Coronary calcium score of 0.

Nomapha Nemulodi

## 2017-05-16 DIAGNOSIS — M1A09X0 Idiopathic chronic gout, multiple sites, without tophus (tophi): Secondary | ICD-10-CM | POA: Diagnosis not present

## 2017-05-23 DIAGNOSIS — M1A09X0 Idiopathic chronic gout, multiple sites, without tophus (tophi): Secondary | ICD-10-CM | POA: Diagnosis not present

## 2017-06-21 DIAGNOSIS — Z0000 Encounter for general adult medical examination without abnormal findings: Secondary | ICD-10-CM | POA: Diagnosis not present

## 2017-06-21 DIAGNOSIS — Z125 Encounter for screening for malignant neoplasm of prostate: Secondary | ICD-10-CM | POA: Diagnosis not present

## 2017-06-21 DIAGNOSIS — M1000 Idiopathic gout, unspecified site: Secondary | ICD-10-CM | POA: Diagnosis not present

## 2017-06-28 DIAGNOSIS — M1000 Idiopathic gout, unspecified site: Secondary | ICD-10-CM | POA: Diagnosis not present

## 2017-06-28 DIAGNOSIS — Z0000 Encounter for general adult medical examination without abnormal findings: Secondary | ICD-10-CM | POA: Diagnosis not present

## 2017-06-28 DIAGNOSIS — E7849 Other hyperlipidemia: Secondary | ICD-10-CM | POA: Diagnosis not present

## 2017-07-08 DIAGNOSIS — Z1212 Encounter for screening for malignant neoplasm of rectum: Secondary | ICD-10-CM | POA: Diagnosis not present

## 2017-10-31 ENCOUNTER — Encounter: Payer: Self-pay | Admitting: *Deleted

## 2017-11-17 DIAGNOSIS — M1A09X0 Idiopathic chronic gout, multiple sites, without tophus (tophi): Secondary | ICD-10-CM | POA: Diagnosis not present

## 2018-04-24 DIAGNOSIS — H60333 Swimmer's ear, bilateral: Secondary | ICD-10-CM | POA: Diagnosis not present

## 2018-04-24 DIAGNOSIS — H6123 Impacted cerumen, bilateral: Secondary | ICD-10-CM | POA: Diagnosis not present

## 2018-06-28 DIAGNOSIS — E785 Hyperlipidemia, unspecified: Secondary | ICD-10-CM | POA: Diagnosis not present

## 2018-06-28 DIAGNOSIS — Z125 Encounter for screening for malignant neoplasm of prostate: Secondary | ICD-10-CM | POA: Diagnosis not present

## 2018-06-28 DIAGNOSIS — R82998 Other abnormal findings in urine: Secondary | ICD-10-CM | POA: Diagnosis not present

## 2018-06-28 DIAGNOSIS — Z0000 Encounter for general adult medical examination without abnormal findings: Secondary | ICD-10-CM | POA: Diagnosis not present

## 2018-06-28 DIAGNOSIS — M109 Gout, unspecified: Secondary | ICD-10-CM | POA: Diagnosis not present

## 2018-07-05 DIAGNOSIS — Z125 Encounter for screening for malignant neoplasm of prostate: Secondary | ICD-10-CM | POA: Diagnosis not present

## 2018-07-05 DIAGNOSIS — M109 Gout, unspecified: Secondary | ICD-10-CM | POA: Diagnosis not present

## 2018-07-05 DIAGNOSIS — E7849 Other hyperlipidemia: Secondary | ICD-10-CM | POA: Diagnosis not present

## 2018-07-05 DIAGNOSIS — N400 Enlarged prostate without lower urinary tract symptoms: Secondary | ICD-10-CM | POA: Diagnosis not present

## 2018-08-22 DIAGNOSIS — M25511 Pain in right shoulder: Secondary | ICD-10-CM | POA: Diagnosis not present

## 2018-09-18 DIAGNOSIS — Z6830 Body mass index (BMI) 30.0-30.9, adult: Secondary | ICD-10-CM | POA: Diagnosis not present

## 2018-09-18 DIAGNOSIS — J329 Chronic sinusitis, unspecified: Secondary | ICD-10-CM | POA: Diagnosis not present

## 2018-09-22 DIAGNOSIS — R51 Headache: Secondary | ICD-10-CM | POA: Diagnosis not present

## 2018-09-22 DIAGNOSIS — R6883 Chills (without fever): Secondary | ICD-10-CM | POA: Diagnosis not present

## 2019-04-16 ENCOUNTER — Other Ambulatory Visit: Payer: Self-pay

## 2019-04-16 DIAGNOSIS — Z20822 Contact with and (suspected) exposure to covid-19: Secondary | ICD-10-CM

## 2019-07-17 HISTORY — PX: KNEE ARTHROSCOPY: SUR90

## 2019-11-15 DIAGNOSIS — C801 Malignant (primary) neoplasm, unspecified: Secondary | ICD-10-CM

## 2019-11-15 HISTORY — DX: Malignant (primary) neoplasm, unspecified: C80.1

## 2019-11-29 ENCOUNTER — Encounter: Payer: Self-pay | Admitting: Internal Medicine

## 2019-12-11 ENCOUNTER — Other Ambulatory Visit: Payer: Self-pay | Admitting: Urology

## 2019-12-25 ENCOUNTER — Other Ambulatory Visit: Payer: Self-pay

## 2019-12-25 ENCOUNTER — Ambulatory Visit (AMBULATORY_SURGERY_CENTER): Payer: Self-pay | Admitting: *Deleted

## 2019-12-25 VITALS — Temp 97.1°F | Ht 71.0 in | Wt 201.4 lb

## 2019-12-25 DIAGNOSIS — Z86010 Personal history of colonic polyps: Secondary | ICD-10-CM

## 2019-12-25 MED ORDER — SUTAB 1479-225-188 MG PO TABS: 1.0000 | ORAL_TABLET | ORAL | 0 refills | Status: DC

## 2019-12-25 NOTE — Progress Notes (Signed)
Patient denies any allergies to egg or soy products. Patient denies complications with anesthesia/sedation.  Patient denies oxygen use at home and denies diet medications. Emmi instructions for colonoscopy explained and given to patient.  Sutab coupon given at [facility] appt.  Patient scheduled to have your 2nd covid vaccination tomorrow, 12/26/19.

## 2020-01-01 ENCOUNTER — Encounter: Payer: Self-pay | Admitting: Internal Medicine

## 2020-01-10 ENCOUNTER — Ambulatory Visit (AMBULATORY_SURGERY_CENTER): Payer: 59 | Admitting: Internal Medicine

## 2020-01-10 ENCOUNTER — Encounter: Payer: Self-pay | Admitting: Internal Medicine

## 2020-01-10 ENCOUNTER — Other Ambulatory Visit: Payer: Self-pay

## 2020-01-10 VITALS — BP 121/72 | HR 49 | Temp 96.9°F | Resp 16 | Ht 71.0 in | Wt 201.4 lb

## 2020-01-10 DIAGNOSIS — K635 Polyp of colon: Secondary | ICD-10-CM

## 2020-01-10 DIAGNOSIS — Z86010 Personal history of colonic polyps: Secondary | ICD-10-CM | POA: Diagnosis not present

## 2020-01-10 DIAGNOSIS — D120 Benign neoplasm of cecum: Secondary | ICD-10-CM

## 2020-01-10 MED ORDER — SODIUM CHLORIDE 0.9 % IV SOLN: 500.0000 mL | Freq: Once | INTRAVENOUS | Status: DC

## 2020-01-10 NOTE — Patient Instructions (Signed)
YOU HAD AN ENDOSCOPIC PROCEDURE TODAY AT THE [facility]:   Refer to the procedure report that was given to you for any specific questions about what was found during the examination.  If the procedure report does not answer your questions, please call your gastroenterologist to clarify.  If you requested that your care partner not be given the details of your procedure findings, then the procedure report has been included in a sealed envelope for you to review at your convenience later.  YOU SHOULD EXPECT: Some feelings of bloating in the abdomen. Passage of more gas than usual.  Walking can help get rid of the air that was put into your GI tract during the procedure and reduce the bloating. If you had a lower endoscopy (such as a colonoscopy or flexible sigmoidoscopy) you may notice spotting of blood in your stool or on the toilet paper. If you underwent a bowel prep for your procedure, you may not have a normal bowel movement for a few days.  Please Note:  You might notice some irritation and congestion in your nose or some drainage.  This is from the oxygen used during your procedure.  There is no need for concern and it should clear up in a day or so.  SYMPTOMS TO REPORT IMMEDIATELY:   Following lower endoscopy (colonoscopy or flexible sigmoidoscopy):  Excessive amounts of blood in the stool  Significant tenderness or worsening of abdominal pains  Swelling of the abdomen that is new, acute  Fever of 100F or higher   For urgent or emergent issues, a gastroenterologist can be reached at any hour by calling [phone]. Do not use MyChart messaging for urgent concerns.    DIET:  We do recommend a small meal at first, but then you may proceed to your regular diet.  Drink plenty of fluids but you should avoid alcoholic beverages for 24 hours.  MEDICATIONS: Continue present medications.  Please see handouts given to you by your recovery nurse.  ACTIVITY:  You should plan to  take it easy for the rest of today and you should NOT DRIVE or use heavy machinery until tomorrow (because of the sedation medicines used during the test).    FOLLOW UP: Our staff will call the number listed on your records 48-72 hours following your procedure to check on you and address any questions or concerns that you may have regarding the information given to you following your procedure. If we do not reach you, we will leave a message.  We will attempt to reach you two times.  During this call, we will ask if you have developed any symptoms of COVID 19. If you develop any symptoms (ie: fever, flu-like symptoms, shortness of breath, cough etc.) before then, please call [phone].  If you test positive for Covid 19 in the 2 weeks post procedure, please call and report this information to us.    If any biopsies were taken you will be contacted by phone or by letter within the next 1-3 weeks.  Please call us at [phone] if you have not heard about the biopsies in 3 weeks.   Thank you for allowing us to provide for your healthcare needs today.   SIGNATURES/CONFIDENTIALITY: You and/or your care partner have signed paperwork which will be entered into your electronic medical record.  These signatures attest to the fact that that the information above on your After Visit Summary has been reviewed and is understood.  Full responsibility of the   confidentiality of this discharge information lies with you and/or your care-partner. 

## 2020-01-10 NOTE — Progress Notes (Signed)
Report to PACU, RN, vss, BBS= Clear.  

## 2020-01-10 NOTE — Op Note (Signed)
[facility] Patient Name: Kevin Wallace Procedure Date: 01/10/2020 7:56 AM MRN: [id number] Endoscopist: Jerene Bears , MD Age: 59 Referring MD:  Date of Birth: [birth year] Gender: Male Account #: [id number] Procedure:                Colonoscopy Indications:              High risk colon cancer surveillance: Personal                            history of sessile serrated colon polyp (less than                            10 mm in size) with no dysplasia, Last colonoscopy                            5 years ago Medicines:                Monitored Anesthesia Care Procedure:                Pre-Anesthesia Assessment:                           - Prior to the procedure, a History and Physical                            was performed, and patient medications and                            allergies were reviewed. The patient's tolerance of                            previous anesthesia was also reviewed. The risks                            and benefits of the procedure and the sedation                            options and risks were discussed with the patient.                            All questions were answered, and informed consent                            was obtained. Prior Anticoagulants: The patient has                            taken no previous anticoagulant or antiplatelet                            agents. ASA Grade Assessment: II - A patient with                            mild systemic disease. After reviewing the risks  and benefits, the patient was deemed in                            satisfactory condition to undergo the procedure.                           After obtaining informed consent, the colonoscope                            was passed under direct vision. Throughout the                            procedure, the patient's blood pressure, pulse, and                            oxygen saturations were monitored continuously. The                            Colonoscope was introduced through the anus and                            advanced to the the cecum, identified by                            appendiceal orifice and ileocecal valve. The                            colonoscopy was performed without difficulty. The                            patient tolerated the procedure well. The quality                            of the bowel preparation was good. The ileocecal                            valve, appendiceal orifice, and rectum were                            photographed. Scope In: 7:58:20 AM Scope Out: 8:11:29 AM Scope Withdrawal Time: 0 hours 10 minutes 32 seconds  Total Procedure Duration: 0 hours 13 minutes 9 seconds  Findings:                 The digital rectal exam was normal.                           A 4 mm polyp was found in the ileocecal valve. The                            polyp was sessile. The polyp was removed with a                            cold snare. Resection and retrieval were complete.  Many small and large-mouthed diverticula were found                            from ascending colon to sigmoid colon. There was no                            evidence of diverticular bleeding.                           Internal hemorrhoids were found during                            retroflexion. The hemorrhoids were small. Complications:            No immediate complications. Estimated Blood Loss:     Estimated blood loss was minimal. Impression:               - One 4 mm polyp at the ileocecal valve, removed                            with a cold snare. Resected and retrieved.                           - Severe diverticulosis from ascending colon to                            sigmoid colon. There was no evidence of                            diverticular bleeding.                           - Internal hemorrhoids. Recommendation:           - Patient has a contact number available  for                            emergencies. The signs and symptoms of potential                            delayed complications were discussed with the                            patient. Return to normal activities tomorrow.                            Written discharge instructions were provided to the                            patient.                           - Resume previous diet.                           - Continue present medications.                           -  Await pathology results.                           - Repeat colonoscopy is recommended for                            surveillance. The colonoscopy date will be                            determined after pathology results from today's                            exam become available for review. Jerene Bears, MD 01/10/2020 8:19:42 AM This report has been signed electronically.

## 2020-01-10 NOTE — Progress Notes (Signed)
VS- Nash Mantis  2nd dose of covid vaccine 12-26-19  Pt's states no medical or surgical changes since previsit or office visit.

## 2020-01-10 NOTE — Progress Notes (Signed)
Called to room to assist during endoscopic procedure.  Patient ID and intended procedure confirmed with present staff. Received instructions for my participation in the procedure from the performing physician.  

## 2020-01-15 ENCOUNTER — Telehealth: Payer: Self-pay | Admitting: *Deleted

## 2020-01-15 NOTE — Telephone Encounter (Signed)
  Follow up Call-  Call back number 01/10/2020  Post procedure Call Back phone  # [phone]  Permission to leave phone message Yes  Some recent data might be hidden     Patient questions:  Message left to call us if necessary.  Second call.

## 2020-01-15 NOTE — Telephone Encounter (Signed)
°  Follow up Call-  Call back number 01/10/2020  Post procedure Call Back phone  # [phone]  Permission to leave phone message Yes  Some recent data might be hidden     Patient questions:  Message left to call if necessary.

## 2020-01-20 ENCOUNTER — Encounter: Payer: Self-pay | Admitting: Internal Medicine

## 2020-02-14 ENCOUNTER — Encounter (HOSPITAL_COMMUNITY): Payer: Self-pay

## 2020-02-14 NOTE — Patient Instructions (Signed)
DUE TO COVID-19 ONLY ONE VISITOR IS ALLOWED TO COME WITH YOU AND STAY IN THE WAITING ROOM ONLY DURING PRE OP AND PROCEDURE DAY OF SURGERY. TWO  VISITOR MAY VISIT WITH YOU AFTER SURGERY IN YOUR PRIVATE ROOM DURING VISITING HOURS ONLY!  10a-8p  YOU NEED TO HAVE A COVID 19 TEST ON__7-12_____ @_______ , THIS TEST MUST BE DONE BEFORE SURGERY, COME  [address], [place] , [address].  ([place]) ONCE YOUR COVID TEST IS COMPLETED, PLEASE BEGIN THE QUARANTINE INSTRUCTIONS AS OUTLINED IN YOUR HANDOUT.                Kevin Wallace  02/14/2020   Your procedure is scheduled on: 02-28-20   Report to [facility] Main  Entrance   Report to short stay  at       0530  AM     Call this number if you have problems the morning of surgery [phone]    Remember: One fleets enema the night before surgery One 8 oz bottle of magnesium citrate by noon the day before surgery Do not eat food or drink liquids :After Midnight.    BRUSH YOUR TEETH MORNING OF SURGERY AND RINSE YOUR MOUTH OUT, NO CHEWING GUM CANDY OR MINTS.     Take these medicines the morning of surgery with A SIP OF WATER: allopurinol                                 You may not have any metal on your body including hair pins and              piercings  Do not wear jewelry,lotions, powders or perfumes, deodorant              Men may shave face and neck.   Do not bring valuables to the hospital. [place].  Contacts, dentures or bridgework may not be worn into surgery.      Patients discharged the day of surgery will not be allowed to drive home. IF YOU ARE HAVING SURGERY AND GOING HOME THE SAME DAY, YOU MUST HAVE AN ADULT TO DRIVE YOU HOME AND BE WITH YOU FOR 24 HOURS. YOU MAY GO HOME BY TAXI OR UBER OR ORTHERWISE, BUT AN ADULT MUST ACCOMPANY YOU HOME AND STAY WITH YOU FOR 24 HOURS.  Name and phone number of your driver:  Special Instructions: N/A               Please read over the following fact sheets you were given: _____________________________________________________________________             [facility] - Preparing for Surgery Before surgery, you can play an important role.  Because skin is not sterile, your skin needs to be as free of germs as possible.  You can reduce the number of germs on your skin by washing with CHG (chlorahexidine gluconate) soap before surgery.  CHG is an antiseptic cleaner which kills germs and bonds with the skin to continue killing germs even after washing. Please DO NOT use if you have an allergy to CHG or antibacterial soaps.  If your skin becomes reddened/irritated stop using the CHG and inform your nurse when you arrive at Short Stay. Do not shave (including legs and underarms) for at least 48 hours prior to  the first CHG shower.  You may shave your face/neck. Please follow these instructions carefully:  1.  Shower with CHG Soap the night before surgery and the  morning of Surgery.  2.  If you choose to wash your hair, wash your hair first as usual with your  normal  shampoo.  3.  After you shampoo, rinse your hair and body thoroughly to remove the  shampoo.                           4.  Use CHG as you would any other liquid soap.  You can apply chg directly  to the skin and wash                       Gently with a scrungie or clean washcloth.  5.  Apply the CHG Soap to your body ONLY FROM THE NECK DOWN.   Do not use on face/ open                           Wound or open sores. Avoid contact with eyes, ears mouth and genitals (private parts).                       Wash face,  Genitals (private parts) with your normal soap.             6.  Wash thoroughly, paying special attention to the area where your surgery  will be performed.  7.  Thoroughly rinse your body with warm water from the neck down.  8.  DO NOT shower/wash with your normal soap after using and rinsing off  the CHG Soap.                9.  Pat  yourself dry with a clean towel.            10.  Wear clean pajamas.            11.  Place clean sheets on your bed the night of your first shower and do not  sleep with pets. Day of Surgery : Do not apply any lotions/deodorants the morning of surgery.  Please wear clean clothes to the hospital/surgery center.  FAILURE TO FOLLOW THESE INSTRUCTIONS MAY RESULT IN THE CANCELLATION OF YOUR SURGERY PATIENT SIGNATURE_________________________________  NURSE SIGNATURE__________________________________  ________________________________________________________________________   Kevin Wallace  An incentive spirometer is a tool that can help keep your lungs clear and active. This tool measures how well you are filling your lungs with each breath. Taking long deep breaths may help reverse or decrease the chance of developing breathing (pulmonary) problems (especially infection) following:  A long period of time when you are unable to move or be active. BEFORE THE PROCEDURE   If the spirometer includes an indicator to show your best effort, your nurse or respiratory therapist will set it to a desired goal.  If possible, sit up straight or lean slightly forward. Try not to slouch.  Hold the incentive spirometer in an upright position. INSTRUCTIONS FOR USE  1. Sit on the edge of your bed if possible, or sit up as far as you can in bed or on a chair. 2. Hold the incentive spirometer in an upright position. 3. Breathe out normally. 4. Place the mouthpiece in your mouth and seal your lips tightly around it. 5. Breathe in slowly and  as deeply as possible, raising the piston or the ball toward the top of the column. 6. Hold your breath for 3-5 seconds or for as long as possible. Allow the piston or ball to fall to the bottom of the column. 7. Remove the mouthpiece from your mouth and breathe out normally. 8. Rest for a few seconds and repeat Steps 1 through 7 at least 10 times every 1-2 hours  when you are awake. Take your time and take a few normal breaths between deep breaths. 9. The spirometer may include an indicator to show your best effort. Use the indicator as a goal to work toward during each repetition. 10. After each set of 10 deep breaths, practice coughing to be sure your lungs are clear. If you have an incision (the cut made at the time of surgery), support your incision when coughing by placing a pillow or rolled up towels firmly against it. Once you are able to get out of bed, walk around indoors and cough well. You may stop using the incentive spirometer when instructed by your caregiver.  RISKS AND COMPLICATIONS  Take your time so you do not get dizzy or light-headed.  If you are in pain, you may need to take or ask for pain medication before doing incentive spirometry. It is harder to take a deep breath if you are having pain. AFTER USE  Rest and breathe slowly and easily.  It can be helpful to keep track of a log of your progress. Your caregiver can provide you with a simple table to help with this. If you are using the spirometer at home, follow these instructions: [place] IF:   You are having difficultly using the spirometer.  You have trouble using the spirometer as often as instructed.  Your pain medication is not giving enough relief while using the spirometer.  You develop fever of 100.5 F (38.1 C) or higher. SEEK IMMEDIATE MEDICAL CARE IF:   You cough up bloody sputum that had not been present before.  You develop fever of 102 F (38.9 C) or greater.  You develop worsening pain at or near the incision site. MAKE SURE YOU:   Understand these instructions.  Will watch your condition.  Will get help right away if you are not doing well or get worse. Document Released: 12/13/2006 Document Revised: 10/25/2011 Document Reviewed: 02/13/2007 [facility] Patient Information 2014 [place],  [place].   ________________________________________________________________________

## 2020-02-14 NOTE — Progress Notes (Signed)
PCP - Marton Redwood, MD Cardiologist - no  PPM/ICD -  Device Orders -  Rep Notified -   Chest x-ray -  EKG -  Stress Test -  ECHO -  Cardiac Cath -   Sleep Study -  CPAP -   Fasting Blood Sugar -  Checks Blood Sugar _____ times a day  Blood Thinner Instructions: Aspirin Instructions:  ERAS Protcol - PRE-SURGERY Ensure or G2-   COVID TEST-   Activity - Can climb a flight of stairs without SOB Anesthesia review:   Patient denies shortness of breath, fever, cough and chest pain at PAT appointment   none   All instructions explained to the patient, with a verbal understanding of the material. Patient agrees to go over the instructions while at home for a better understanding. Patient also instructed to self quarantine after being tested for COVID-19. The opportunity to ask questions was provided.

## 2020-02-19 ENCOUNTER — Other Ambulatory Visit: Payer: Self-pay

## 2020-02-19 ENCOUNTER — Encounter (HOSPITAL_COMMUNITY): Payer: Self-pay

## 2020-02-19 ENCOUNTER — Encounter (HOSPITAL_COMMUNITY)
Admission: RE | Admit: 2020-02-19 | Discharge: 2020-02-19 | Disposition: A | Discharge status: Home / Self Care | Payer: 59 | Source: Ambulatory Visit | Attending: Urology | Admitting: Urology

## 2020-02-19 DIAGNOSIS — Z01812 Encounter for preprocedural laboratory examination: Secondary | ICD-10-CM | POA: Diagnosis present

## 2020-02-19 HISTORY — DX: Other complications of anesthesia, initial encounter: T88.59XA

## 2020-02-19 LAB — CBC
HCT: 49.9 % (ref 39.0–52.0)
Hemoglobin: 16.5 g/dL (ref 13.0–17.0)
MCH: 30.9 pg (ref 26.0–34.0)
MCHC: 33.1 g/dL (ref 30.0–36.0)
MCV: 93.4 fL (ref 80.0–100.0)
Platelets: 332 10*3/uL (ref 150–400)
RBC: 5.34 MIL/uL (ref 4.22–5.81)
RDW: 13 % (ref 11.5–15.5)
WBC: 7.5 10*3/uL (ref 4.0–10.5)
nRBC: 0 % (ref 0.0–0.2)

## 2020-02-19 LAB — BASIC METABOLIC PANEL
Anion gap: 9 (ref 5–15)
BUN: 20 mg/dL (ref 6–20)
CO2: 26 mmol/L (ref 22–32)
Calcium: 9.2 mg/dL (ref 8.9–10.3)
Chloride: 104 mmol/L (ref 98–111)
Creatinine, Ser: 0.9 mg/dL (ref 0.61–1.24)
GFR calc Af Amer: >60 mL/min (ref >60)
GFR calc non Af Amer: >60 mL/min (ref >60)
Glucose, Bld: 95 mg/dL (ref 70–99)
Potassium: 4.2 mmol/L (ref 3.5–5.1)
Sodium: 139 mmol/L (ref 135–145)

## 2020-02-25 ENCOUNTER — Other Ambulatory Visit (HOSPITAL_COMMUNITY)
Admission: RE | Admit: 2020-02-25 | Discharge: 2020-02-25 | Disposition: A | Discharge status: Home / Self Care | Payer: 59 | Source: Ambulatory Visit | Attending: Urology | Admitting: Urology

## 2020-02-25 DIAGNOSIS — Z20822 Contact with and (suspected) exposure to covid-19: Secondary | ICD-10-CM | POA: Diagnosis not present

## 2020-02-25 LAB — SARS CORONAVIRUS 2 (TAT 6-24 HRS): SARS Coronavirus 2: NEGATIVE

## 2020-02-27 NOTE — Anesthesia Preprocedure Evaluation (Addendum)
Anesthesia Evaluation  Patient identified by MRN, date of birth, ID band Patient awake    Reviewed: Allergy & Precautions, NPO status , Patient's Chart, lab work & pertinent test results  History of Anesthesia Complications Negative for: history of anesthetic complications  Airway Mallampati: III  TM Distance: >3 FB Neck ROM: Full  Mouth opening: Limited Mouth Opening  Dental  (+) Dental Advisory Given   Pulmonary neg pulmonary ROS,  02/25/2020 SARS coronavirus NEG   breath sounds clear to auscultation       Cardiovascular (-) angina Rhythm:Regular Rate:Normal  '16 Exercise stress test: normal   Neuro/Psych negative neurological ROS     GI/Hepatic negative GI ROS, Neg liver ROS,   Endo/Other  negative endocrine ROS  Renal/GU negative Renal ROS   Prostate cancer    Musculoskeletal   Abdominal   Peds  Hematology negative hematology ROS (+)   Anesthesia Other Findings   Reproductive/Obstetrics                            Anesthesia Physical Anesthesia Plan  ASA: II  Anesthesia Plan: General   Post-op Pain Management:    Induction: Intravenous  PONV Risk Score and Plan: 3 and Ondansetron, Dexamethasone and Scopolamine patch - Pre-op  Airway Management Planned: Oral ETT  Additional Equipment: None  Intra-op Plan:   Post-operative Plan: Extubation in OR  Informed Consent: I have reviewed the patients History and Physical, chart, labs and discussed the procedure including the risks, benefits and alternatives for the proposed anesthesia with the patient or authorized representative who has indicated his/her understanding and acceptance.     Dental advisory given  Plan Discussed with: CRNA and Surgeon  Anesthesia Plan Comments:        Anesthesia Quick Evaluation

## 2020-02-27 NOTE — H&P (Signed)
Office Visit Report     02/05/2020   --------------------------------------------------------------------------------   Kevin Wallace  MRN: [id number]  DOB: Jan 14, 1961, 59 year old Male  SSN: [id number]   PRIMARY CARE:  Kevin Kevin Gerold, MD  REFERRING:  Kevin Levering, MD  PROVIDER:  Raynelle Wallace, M.D.  TREATING:  Kevin Wallace, [place]  LOCATION:  [facility] [phone]     --------------------------------------------------------------------------------   CC/HPI: Pt presents today for pre-operative history and physical exam in anticipation of robotic assisted lap prostatectomy with bilateral pelvic lymph node dissection by Kevin Wallace on 7/15//21. He is doing well and is without complaint. Pt denies F/C, HA, CP, SOB, N/V, diarrhea/constipation, back pain, flank pain, hematuria, and dysuria.    Hx:   CC: Prostate Cancer   Physician requesting consult: Kevin Wallace  PCP: Kevin Wallace   Kevin Wallace is a 59 year old gentleman who was found to have an elevated PSA of 7.41 prompting a TRUS biopsy of the prostate on 11/07/19. This demonstrated Gleason 3+4=7 adenocarcinoma of the prostate with 7 out of 12 biopsy cores positive for malignancy.   Family history: None.   Imaging studies: None.   PMH: He has a history of hyperlipidemia and gout.  PSH: No abdominal surgeries.   TNM stage: cT1c Nx Mx  PSA: 7.41  Gleason score: 3+4=7 (Grade group 2)  Biopsy (11/07/19): 7/12 cores positive  Left: L lateral mid (30%, 3+3=6), L lateral base (30%, 3+3=6), L base (10%, 3+3=6)  Right: R lateral apex (5%, 3+3=6), R mid (50%, 3+4=7, PNI), R base (5%, 3+3=6), R lateral base (5%, 3+4=7)  Prostate volume: 22.6 cc   Nomogram  OC disease: 52%  EPE: 45%  SVI: 6%  LNI: 6%  PFS (5 year, 10 year): 80%, 68%   Urinary function: IPSS is 2.  Erectile function: SHIM score is 23.     ALLERGIES: No Allergies    MEDICATIONS: Allopurinol  Levofloxacin 750 mg tablet  Please take one tablet the morning of your biopsy.     GU PSH: Prostate Needle Biopsy - 11/07/2019       PSH Notes: Encounter for contraceptive planning, Arthroscopy Ankle, Arthroscopy Knee Left   NON-GU PSH: Arthroscopy Joint - 2011 Knee Arthroscopy; Dx - 2011 Surgical Pathology, Gross And Microscopic Examination For Prostate Needle - 11/07/2019     GU PMH: Stress Incontinence - 01/23/2020 Prostate Cancer - 11/27/2019 Elevated PSA - 09/14/2019      PMH Notes:   1) Elevated PSA   NON-GU PMH: Muscle weakness (generalized) - 01/23/2020, - 12/13/2019 Other muscle spasm - 01/23/2020, - 12/13/2019 Gout Hypercholesterolemia    FAMILY HISTORY: Rheumatic Fever - Father    Notes: 2 children, living    SOCIAL HISTORY: Marital Status: Married Preferred Language: English; Ethnicity: Not Hispanic Or Latino; Race: White Current Smoking Status: Patient has never smoked.   Tobacco Use Assessment Completed: Used Tobacco in last 30 days? Does not drink anymore.  Does not use drugs. Drinks 1 caffeinated drink per day. Has not had a blood transfusion.     Notes: Marital History - Currently Married, Caffeine Use   REVIEW OF SYSTEMS:    GU Review Male:   Patient denies frequent urination, hard to postpone urination, burning/ pain with urination, get up at night to urinate, leakage of urine, stream starts and stops, trouble starting your stream, have to strain to urinate , erection problems, and penile pain.  Gastrointestinal (Upper):  Patient denies nausea, vomiting, and indigestion/ heartburn.  Gastrointestinal (Lower):   Patient denies constipation and diarrhea.  Constitutional:   Patient denies fever, night sweats, weight loss, and fatigue.  Skin:   Patient denies skin rash/ lesion and itching.  Eyes:   Patient denies blurred vision and double vision.  Ears/ Nose/ Throat:   Patient denies sore throat and sinus problems.  Hematologic/Lymphatic:   Patient denies swollen glands and easy bruising.   Cardiovascular:   Patient denies leg swelling and chest pains.  Respiratory:   Patient denies cough and shortness of breath.  Endocrine:   Patient denies excessive thirst.  Musculoskeletal:   Patient denies back pain and joint pain.  Neurological:   Patient denies headaches and dizziness.  Psychologic:   Patient denies depression and anxiety.   VITAL SIGNS:      02/05/2020 01:19 PM  Weight 192 lb / 87.09 kg  Height 71 in / 180.34 cm  BP 117/67 mmHg  Pulse 58 /min  Temperature 97.3 F / 36.2 C  BMI 26.8 kg/m   MULTI-SYSTEM PHYSICAL EXAMINATION:    Constitutional: Well-nourished. No physical deformities. Normally developed. Good grooming.  Neck: Neck symmetrical, not swollen. Normal tracheal position.  Respiratory: Normal breath sounds. No labored breathing, no use of accessory muscles.   Cardiovascular: Regular rate and rhythm. No murmur, no gallop.   Lymphatic: No enlargement, no tenderness of axillae, groin, neck lymph nodes.  Skin: No paleness, no jaundice, no cyanosis. No lesion, no ulcer, no rash.  Neurologic / Psychiatric: Oriented to time, oriented to place, oriented to person. No depression, no anxiety, no agitation.  Gastrointestinal: No mass, no tenderness, no rigidity, non obese abdomen.  Eyes: Normal conjunctivae. Normal eyelids.  Ears, Nose, Mouth, and Throat: Left ear no scars, no lesions, no masses. Right ear no scars, no lesions, no masses. Nose no scars, no lesions, no masses. Normal hearing. Normal lips.  Musculoskeletal: Normal gait and station of head and neck.     Complexity of Data:  Records Review:   Previous Patient Records  Urine Test Review:   Urinalysis   02/05/20  Urinalysis  Urine Appearance Clear   Urine Color Yellow   Urine Glucose Neg mg/dL  Urine Bilirubin Neg mg/dL  Urine Ketones Neg mg/dL  Urine Specific Gravity 1.020   Urine Blood Neg ery/uL  Urine pH <=5.0   Urine Protein Neg mg/dL  Urine Urobilinogen 0.2 mg/dL  Urine Nitrites Neg    Urine Leukocyte Esterase Neg leu/uL   PROCEDURES:          Urinalysis - 81003 Dipstick Dipstick Cont'd  Color: Yellow Bilirubin: Neg mg/dL  Appearance: Clear Ketones: Neg mg/dL  Specific Gravity: 1.020 Blood: Neg ery/uL  pH: <=5.0 Protein: Neg mg/dL  Glucose: Neg mg/dL Urobilinogen: 0.2 mg/dL    Nitrites: Neg    Leukocyte Esterase: Neg leu/uL    ASSESSMENT:      ICD-10 Details  1 GU:   Prostate Cancer - C61    PLAN:           Schedule Return Visit/Planned Activity: Keep Scheduled Appointment - Schedule Surgery          Document Letter(s):  Created for Patient: Clinical Summary         Notes:   There are no changes in the patients history or physical exam since last evaluation by Kevin Wallace. Pt is scheduled to undergo RALP with BPLND on 02/28/20.   All pt's questions were answered to the best of my  ability.          Next Appointment:      Next Appointment: 02/13/2020 04:00 PM    Appointment Type: 60 Physical Therapy    Location: [facility] [phone]    Provider: Lovenia Kim    Reason for Visit: PT      * Signed by Mcarthur Rossetti, PA on 02/05/20 at 1:46 PM (EDT)*

## 2020-02-28 ENCOUNTER — Encounter (HOSPITAL_COMMUNITY): Payer: Self-pay | Admitting: Urology

## 2020-02-28 ENCOUNTER — Encounter (HOSPITAL_COMMUNITY)
Admission: RE | Disposition: A | Discharge status: Home / Self Care | Payer: Self-pay | Source: Home / Self Care | Attending: Urology

## 2020-02-28 ENCOUNTER — Ambulatory Visit (HOSPITAL_COMMUNITY): Payer: 59 | Admitting: Certified Registered"

## 2020-02-28 ENCOUNTER — Other Ambulatory Visit: Payer: Self-pay

## 2020-02-28 ENCOUNTER — Observation Stay (HOSPITAL_COMMUNITY)
Admission: RE | Admit: 2020-02-28 | Discharge: 2020-02-29 | Disposition: A | Discharge status: Home / Self Care | Payer: 59 | Attending: Urology | Admitting: Urology

## 2020-02-28 DIAGNOSIS — E785 Hyperlipidemia, unspecified: Secondary | ICD-10-CM | POA: Diagnosis not present

## 2020-02-28 DIAGNOSIS — Z79899 Other long term (current) drug therapy: Secondary | ICD-10-CM | POA: Diagnosis not present

## 2020-02-28 DIAGNOSIS — C61 Malignant neoplasm of prostate: Secondary | ICD-10-CM | POA: Diagnosis not present

## 2020-02-28 HISTORY — PX: ROBOT ASSISTED LAPAROSCOPIC RADICAL PROSTATECTOMY: SHX5141

## 2020-02-28 HISTORY — PX: LYMPHADENECTOMY: SHX5960

## 2020-02-28 LAB — TYPE AND SCREEN
ABO/RH(D): A POS
Antibody Screen: NEGATIVE

## 2020-02-28 LAB — ABO/RH: ABO/RH(D): A POS

## 2020-02-28 LAB — HEMOGLOBIN AND HEMATOCRIT, BLOOD
HCT: 46 % (ref 39.0–52.0)
Hemoglobin: 15.5 g/dL (ref 13.0–17.0)

## 2020-02-28 SURGERY — XI ROBOTIC ASSISTED LAPAROSCOPIC RADICAL PROSTATECTOMY LEVEL 2
Anesthesia: General

## 2020-02-28 MED ORDER — PHENYLEPHRINE HCL (PRESSORS) 10 MG/ML IV SOLN
INTRAVENOUS | Status: AC
  Filled 2020-02-28: qty 1

## 2020-02-28 MED ORDER — ROCURONIUM BROMIDE 10 MG/ML (PF) SYRINGE
PREFILLED_SYRINGE | INTRAVENOUS | Status: DC | PRN
  Administered 2020-02-28: 20 mg via INTRAVENOUS
  Administered 2020-02-28: 60 mg via INTRAVENOUS
  Administered 2020-02-28 (×2): 20 mg via INTRAVENOUS

## 2020-02-28 MED ORDER — ACETAMINOPHEN 325 MG PO TABS
650.0000 mg | ORAL_TABLET | ORAL | Status: DC | PRN
  Administered 2020-02-28: 650 mg via ORAL
  Filled 2020-02-28: qty 2

## 2020-02-28 MED ORDER — ALBUMIN HUMAN 5 % IV SOLN: INTRAVENOUS | Status: DC | PRN

## 2020-02-28 MED ORDER — ROCURONIUM BROMIDE 10 MG/ML (PF) SYRINGE
PREFILLED_SYRINGE | INTRAVENOUS | Status: AC
  Filled 2020-02-28: qty 10

## 2020-02-28 MED ORDER — KETOTIFEN FUMARATE 0.025 % OP SOLN
1.0000 [drp] | Freq: Every day | OPHTHALMIC | Status: DC | PRN
  Filled 2020-02-28: qty 5

## 2020-02-28 MED ORDER — CEFAZOLIN SODIUM-DEXTROSE 1-4 GM/50ML-% IV SOLN
1.0000 g | Freq: Three times a day (TID) | INTRAVENOUS | Status: AC
  Administered 2020-02-28 (×2): 1 g via INTRAVENOUS
  Filled 2020-02-28 (×2): qty 50

## 2020-02-28 MED ORDER — OXYCODONE HCL 5 MG PO TABS: 5.0000 mg | ORAL_TABLET | Freq: Once | ORAL | Status: DC | PRN

## 2020-02-28 MED ORDER — KCL IN DEXTROSE-NACL 20-5-0.45 MEQ/L-%-% IV SOLN
INTRAVENOUS | Status: DC
  Filled 2020-02-28 (×3): qty 1000

## 2020-02-28 MED ORDER — HYDROMORPHONE HCL 1 MG/ML IJ SOLN
INTRAMUSCULAR | Status: AC
  Filled 2020-02-28: qty 1

## 2020-02-28 MED ORDER — MORPHINE SULFATE (PF) 2 MG/ML IV SOLN: 2.0000 mg | INTRAVENOUS | Status: DC | PRN

## 2020-02-28 MED ORDER — OXYCODONE HCL 5 MG/5ML PO SOLN: 5.0000 mg | Freq: Once | ORAL | Status: DC | PRN

## 2020-02-28 MED ORDER — CHLORHEXIDINE GLUCONATE CLOTH 2 % EX PADS
6.0000 | MEDICATED_PAD | Freq: Every day | CUTANEOUS | Status: DC
  Administered 2020-02-29: 6 via TOPICAL

## 2020-02-28 MED ORDER — DOCUSATE SODIUM 100 MG PO CAPS
100.0000 mg | ORAL_CAPSULE | Freq: Two times a day (BID) | ORAL | Status: DC
  Administered 2020-02-28 – 2020-02-29 (×2): 100 mg via ORAL
  Filled 2020-02-28 (×2): qty 1

## 2020-02-28 MED ORDER — MIDAZOLAM HCL 2 MG/2ML IJ SOLN
INTRAMUSCULAR | Status: AC
  Filled 2020-02-28: qty 2

## 2020-02-28 MED ORDER — HEPARIN SODIUM (PORCINE) 1000 UNIT/ML IJ SOLN
INTRAMUSCULAR | Status: AC
  Filled 2020-02-28: qty 1

## 2020-02-28 MED ORDER — MIDAZOLAM HCL 2 MG/2ML IJ SOLN: 0.5000 mg | Freq: Once | INTRAMUSCULAR | Status: DC | PRN

## 2020-02-28 MED ORDER — ONDANSETRON HCL 4 MG/2ML IJ SOLN
INTRAMUSCULAR | Status: AC
  Filled 2020-02-28: qty 2

## 2020-02-28 MED ORDER — FLEET ENEMA 7-19 GM/118ML RE ENEM: 1.0000 | ENEMA | Freq: Once | RECTAL | Status: DC

## 2020-02-28 MED ORDER — PROPOFOL 10 MG/ML IV BOLUS
INTRAVENOUS | Status: DC | PRN
  Administered 2020-02-28: 120 mg via INTRAVENOUS

## 2020-02-28 MED ORDER — MEPERIDINE HCL 50 MG/ML IJ SOLN: 6.2500 mg | INTRAMUSCULAR | Status: DC | PRN

## 2020-02-28 MED ORDER — LIDOCAINE 2% (20 MG/ML) 5 ML SYRINGE
INTRAMUSCULAR | Status: AC
  Filled 2020-02-28: qty 5

## 2020-02-28 MED ORDER — PROPOFOL 10 MG/ML IV BOLUS
INTRAVENOUS | Status: AC
  Filled 2020-02-28: qty 40

## 2020-02-28 MED ORDER — FENTANYL CITRATE (PF) 100 MCG/2ML IJ SOLN
INTRAMUSCULAR | Status: AC
  Filled 2020-02-28: qty 2

## 2020-02-28 MED ORDER — CEFAZOLIN SODIUM-DEXTROSE 2-4 GM/100ML-% IV SOLN
2.0000 g | Freq: Once | INTRAVENOUS | Status: AC
  Administered 2020-02-28: 2 g via INTRAVENOUS
  Filled 2020-02-28: qty 100

## 2020-02-28 MED ORDER — BELLADONNA ALKALOIDS-OPIUM 16.2-60 MG RE SUPP
1.0000 | Freq: Four times a day (QID) | RECTAL | Status: DC | PRN
  Administered 2020-02-28: 1 via RECTAL
  Filled 2020-02-28: qty 1

## 2020-02-28 MED ORDER — KETOROLAC TROMETHAMINE 15 MG/ML IJ SOLN
INTRAMUSCULAR | Status: AC
  Filled 2020-02-28: qty 1

## 2020-02-28 MED ORDER — SODIUM CHLORIDE 0.9 % IR SOLN
Status: DC | PRN
  Administered 2020-02-28: 1000 mL via INTRAVESICAL

## 2020-02-28 MED ORDER — FENTANYL CITRATE (PF) 250 MCG/5ML IJ SOLN
INTRAMUSCULAR | Status: DC | PRN
  Administered 2020-02-28 (×2): 50 ug via INTRAVENOUS
  Administered 2020-02-28: 100 ug via INTRAVENOUS
  Administered 2020-02-28 (×4): 50 ug via INTRAVENOUS

## 2020-02-28 MED ORDER — BACITRACIN-NEOMYCIN-POLYMYXIN 400-5-5000 EX OINT: 1.0000 "application " | TOPICAL_OINTMENT | Freq: Three times a day (TID) | CUTANEOUS | Status: DC | PRN

## 2020-02-28 MED ORDER — CHLORHEXIDINE GLUCONATE 0.12 % MT SOLN
15.0000 mL | Freq: Once | OROMUCOSAL | Status: AC
  Administered 2020-02-28: 15 mL via OROMUCOSAL

## 2020-02-28 MED ORDER — HYDROMORPHONE HCL 1 MG/ML IJ SOLN
0.2500 mg | INTRAMUSCULAR | Status: DC | PRN
  Administered 2020-02-28 (×2): 0.5 mg via INTRAVENOUS

## 2020-02-28 MED ORDER — KETOROLAC TROMETHAMINE 15 MG/ML IJ SOLN
15.0000 mg | Freq: Four times a day (QID) | INTRAMUSCULAR | Status: DC
  Administered 2020-02-28 – 2020-02-29 (×4): 15 mg via INTRAVENOUS
  Filled 2020-02-28 (×3): qty 1

## 2020-02-28 MED ORDER — HYDROMORPHONE HCL 1 MG/ML IJ SOLN
INTRAMUSCULAR | Status: AC
  Administered 2020-02-28: 0.5 mg via INTRAVENOUS
  Filled 2020-02-28: qty 1

## 2020-02-28 MED ORDER — SCOPOLAMINE 1 MG/3DAYS TD PT72
1.0000 | MEDICATED_PATCH | TRANSDERMAL | Status: DC
  Administered 2020-02-28: 1.5 mg via TRANSDERMAL
  Filled 2020-02-28: qty 1

## 2020-02-28 MED ORDER — MAGNESIUM CITRATE PO SOLN: 1.0000 | Freq: Once | ORAL | Status: DC

## 2020-02-28 MED ORDER — ORAL CARE MOUTH RINSE: 15.0000 mL | Freq: Once | OROMUCOSAL | Status: AC

## 2020-02-28 MED ORDER — ALLOPURINOL 300 MG PO TABS
300.0000 mg | ORAL_TABLET | Freq: Every day | ORAL | Status: DC
  Administered 2020-02-28 – 2020-02-29 (×2): 300 mg via ORAL
  Filled 2020-02-28 (×2): qty 1

## 2020-02-28 MED ORDER — SULFAMETHOXAZOLE-TRIMETHOPRIM 800-160 MG PO TABS
1.0000 | ORAL_TABLET | Freq: Two times a day (BID) | ORAL | 0 refills | Status: DC
Start: 2020-02-28 — End: 2021-09-01

## 2020-02-28 MED ORDER — PROMETHAZINE HCL 25 MG/ML IJ SOLN: 6.2500 mg | INTRAMUSCULAR | Status: DC | PRN

## 2020-02-28 MED ORDER — ONDANSETRON HCL 4 MG/2ML IJ SOLN
4.0000 mg | INTRAMUSCULAR | Status: DC | PRN
  Administered 2020-02-28: 4 mg via INTRAVENOUS
  Filled 2020-02-28: qty 2

## 2020-02-28 MED ORDER — BUPIVACAINE-EPINEPHRINE (PF) 0.25% -1:200000 IJ SOLN
INTRAMUSCULAR | Status: AC
  Filled 2020-02-28: qty 30

## 2020-02-28 MED ORDER — ONDANSETRON HCL 4 MG/2ML IJ SOLN
INTRAMUSCULAR | Status: DC | PRN
  Administered 2020-02-28: 4 mg via INTRAVENOUS

## 2020-02-28 MED ORDER — STERILE WATER FOR IRRIGATION IR SOLN
Status: DC | PRN
  Administered 2020-02-28: 1000 mL

## 2020-02-28 MED ORDER — SODIUM CHLORIDE 0.9 % IV BOLUS: 1000.0000 mL | Freq: Once | INTRAVENOUS | Status: DC

## 2020-02-28 MED ORDER — EPHEDRINE SULFATE-NACL 50-0.9 MG/10ML-% IV SOSY
PREFILLED_SYRINGE | INTRAVENOUS | Status: DC | PRN
  Administered 2020-02-28 (×2): 5 mg via INTRAVENOUS

## 2020-02-28 MED ORDER — SUGAMMADEX SODIUM 200 MG/2ML IV SOLN
INTRAVENOUS | Status: DC | PRN
  Administered 2020-02-28: 200 mg via INTRAVENOUS

## 2020-02-28 MED ORDER — ACETAMINOPHEN 500 MG PO TABS
1000.0000 mg | ORAL_TABLET | Freq: Once | ORAL | Status: AC
  Administered 2020-02-28: 1000 mg via ORAL
  Filled 2020-02-28: qty 2

## 2020-02-28 MED ORDER — DEXAMETHASONE SODIUM PHOSPHATE 10 MG/ML IJ SOLN
INTRAMUSCULAR | Status: DC | PRN
  Administered 2020-02-28: 8 mg via INTRAVENOUS

## 2020-02-28 MED ORDER — DEXAMETHASONE SODIUM PHOSPHATE 10 MG/ML IJ SOLN
INTRAMUSCULAR | Status: AC
  Filled 2020-02-28: qty 1

## 2020-02-28 MED ORDER — MIDAZOLAM HCL 2 MG/2ML IJ SOLN
INTRAMUSCULAR | Status: DC | PRN
  Administered 2020-02-28: 2 mg via INTRAVENOUS

## 2020-02-28 MED ORDER — TRAMADOL HCL 50 MG PO TABS: 50.0000 mg | ORAL_TABLET | Freq: Four times a day (QID) | ORAL | 0 refills | Status: DC | PRN

## 2020-02-28 MED ORDER — DIPHENHYDRAMINE HCL 50 MG/ML IJ SOLN: 12.5000 mg | Freq: Four times a day (QID) | INTRAMUSCULAR | Status: DC | PRN

## 2020-02-28 MED ORDER — ZOLPIDEM TARTRATE 5 MG PO TABS: 5.0000 mg | ORAL_TABLET | Freq: Every evening | ORAL | Status: DC | PRN

## 2020-02-28 MED ORDER — DIPHENHYDRAMINE HCL 12.5 MG/5ML PO ELIX: 12.5000 mg | ORAL_SOLUTION | Freq: Four times a day (QID) | ORAL | Status: DC | PRN

## 2020-02-28 MED ORDER — BUPIVACAINE-EPINEPHRINE 0.25% -1:200000 IJ SOLN
INTRAMUSCULAR | Status: DC | PRN
  Administered 2020-02-28: 30 mL

## 2020-02-28 MED ORDER — EPHEDRINE 5 MG/ML INJ
INTRAVENOUS | Status: AC
  Filled 2020-02-28: qty 10

## 2020-02-28 MED ORDER — LACTATED RINGERS IV SOLN: INTRAVENOUS | Status: DC

## 2020-02-28 MED ORDER — LACTATED RINGERS IV SOLN
INTRAVENOUS | Status: DC | PRN
  Administered 2020-02-28: 1000 mL

## 2020-02-28 MED ORDER — LIDOCAINE 2% (20 MG/ML) 5 ML SYRINGE
INTRAMUSCULAR | Status: DC | PRN
  Administered 2020-02-28: 20 mg via INTRAVENOUS

## 2020-02-28 SURGICAL SUPPLY — 62 items
ADH SKN CLS APL DERMABOND .7 (GAUZE/BANDAGES/DRESSINGS) ×2
APL PRP STRL LF DISP 70% ISPRP (MISCELLANEOUS) ×1
APL SWBSTK 6 STRL LF DISP (MISCELLANEOUS) ×2
APPLICATOR COTTON TIP 6 STRL (MISCELLANEOUS) ×2 IMPLANT
APPLICATOR COTTON TIP 6IN STRL (MISCELLANEOUS) ×4
CATH FOLEY 2WAY SLVR 18FR 30CC (CATHETERS) ×4 IMPLANT
CATH ROBINSON RED A/P 16FR (CATHETERS) ×4 IMPLANT
CATH ROBINSON RED A/P 8FR (CATHETERS) ×4 IMPLANT
CATH TIEMANN FOLEY 18FR 5CC (CATHETERS) ×4 IMPLANT
CHLORAPREP W/TINT 26 (MISCELLANEOUS) ×4 IMPLANT
CLIP VESOLOCK LG 6/CT PURPLE (CLIP) ×8 IMPLANT
COVER SURGICAL LIGHT HANDLE (MISCELLANEOUS) ×4 IMPLANT
COVER TIP SHEARS 8 DVNC (MISCELLANEOUS) ×2 IMPLANT
COVER TIP SHEARS 8MM DA VINCI (MISCELLANEOUS) ×4
COVER WAND RF STERILE (DRAPES) IMPLANT
CUTTER ECHEON FLEX ENDO 45 340 (ENDOMECHANICALS) ×4 IMPLANT
DECANTER SPIKE VIAL GLASS SM (MISCELLANEOUS) ×4 IMPLANT
DERMABOND ADVANCED (GAUZE/BANDAGES/DRESSINGS) ×2
DERMABOND ADVANCED .7 DNX12 (GAUZE/BANDAGES/DRESSINGS) ×2 IMPLANT
DRAIN CHANNEL RND F F (WOUND CARE) ×4 IMPLANT
DRAPE ARM DVNC X/XI (DISPOSABLE) ×8 IMPLANT
DRAPE COLUMN DVNC XI (DISPOSABLE) ×2 IMPLANT
DRAPE DA VINCI XI ARM (DISPOSABLE) ×16
DRAPE DA VINCI XI COLUMN (DISPOSABLE) ×4
DRAPE SURG IRRIG POUCH 19X23 (DRAPES) ×4 IMPLANT
DRSG TEGADERM 4X4.75 (GAUZE/BANDAGES/DRESSINGS) ×4 IMPLANT
ELECT REM PT RETURN 15FT ADLT (MISCELLANEOUS) ×4 IMPLANT
GLOVE BIO SURGEON STRL SZ 6.5 (GLOVE) ×3 IMPLANT
GLOVE BIO SURGEONS STRL SZ 6.5 (GLOVE) ×1
GLOVE BIOGEL M STRL SZ7.5 (GLOVE) ×8 IMPLANT
GOWN STRL REUS W/TWL LRG LVL3 (GOWN DISPOSABLE) ×12 IMPLANT
HOLDER FOLEY CATH W/STRAP (MISCELLANEOUS) ×4 IMPLANT
IRRIG SUCT STRYKERFLOW 2 WTIP (MISCELLANEOUS) ×4
IRRIGATION SUCT STRKRFLW 2 WTP (MISCELLANEOUS) ×2 IMPLANT
IV LACTATED RINGERS 1000ML (IV SOLUTION) ×4 IMPLANT
KIT TURNOVER KIT A (KITS) IMPLANT
NDL SAFETY ECLIPSE 18X1.5 (NEEDLE) ×2 IMPLANT
NEEDLE HYPO 18GX1.5 SHARP (NEEDLE) ×4
PACK ROBOT UROLOGY CUSTOM (CUSTOM PROCEDURE TRAY) ×4 IMPLANT
PENCIL SMOKE EVACUATOR (MISCELLANEOUS) IMPLANT
SEAL CANN UNIV 5-8 DVNC XI (MISCELLANEOUS) ×8 IMPLANT
SEAL XI 5MM-8MM UNIVERSAL (MISCELLANEOUS) ×16
SET IRRIG Y TYPE TUR BLADDER L (SET/KITS/TRAYS/PACK) ×4 IMPLANT
SET TUBE SMOKE EVAC HIGH FLOW (TUBING) ×4 IMPLANT
SOLUTION ELECTROLUBE (MISCELLANEOUS) ×4 IMPLANT
STAPLE RELOAD 45 GRN (STAPLE) ×2 IMPLANT
STAPLE RELOAD 45MM GREEN (STAPLE) ×4
SUT ETHILON 3 0 PS 1 (SUTURE) ×4 IMPLANT
SUT MNCRL 3 0 RB1 (SUTURE) ×2 IMPLANT
SUT MNCRL 3 0 VIOLET RB1 (SUTURE) ×2 IMPLANT
SUT MNCRL AB 4-0 PS2 18 (SUTURE) ×8 IMPLANT
SUT MONOCRYL 3 0 RB1 (SUTURE) ×8
SUT VIC AB 0 CT1 27 (SUTURE) ×4
SUT VIC AB 0 CT1 27XBRD ANTBC (SUTURE) ×2 IMPLANT
SUT VIC AB 0 UR5 27 (SUTURE) ×4 IMPLANT
SUT VIC AB 2-0 SH 27 (SUTURE) ×4
SUT VIC AB 2-0 SH 27X BRD (SUTURE) ×2 IMPLANT
SUT VICRYL 0 UR6 27IN ABS (SUTURE) ×8 IMPLANT
SYR 27GX1/2 1ML LL SAFETY (SYRINGE) ×4 IMPLANT
TOWEL OR NON WOVEN STRL DISP B (DISPOSABLE) ×4 IMPLANT
TROCAR XCEL NON-BLD 5MMX100MML (ENDOMECHANICALS) IMPLANT
WATER STERILE IRR 1000ML POUR (IV SOLUTION) ×4 IMPLANT

## 2020-02-28 NOTE — Anesthesia Postprocedure Evaluation (Signed)
Anesthesia Post Note  Patient: Kevin Wallace  Procedure(s) Performed: XI ROBOTIC ASSISTED LAPAROSCOPIC RADICAL PROSTATECTOMY LEVEL 2 (N/A ) LYMPHADENECTOMY, PELVIC (Bilateral )     Patient location during evaluation: PACU Anesthesia Type: General Level of consciousness: awake and alert, oriented and patient cooperative Pain management: pain level controlled (pt says he's feeling much better) Vital Signs Assessment: post-procedure vital signs reviewed and stable Respiratory status: spontaneous breathing, nonlabored ventilation and respiratory function stable Cardiovascular status: blood pressure returned to baseline and stable Postop Assessment: no apparent nausea or vomiting Anesthetic complications: no   No complications documented.  Last Vitals:  Vitals:   02/28/20 1430 02/28/20 1458  BP: 133/70 133/73  Pulse: (!) 55 (!) 58  Resp: 18 18  Temp:  36.6 C  SpO2: 97% 96%    Last Pain:  Vitals:   02/28/20 1459  TempSrc:   PainSc: 4                  Billyjack Trompeter,E. Librado Guandique

## 2020-02-28 NOTE — Progress Notes (Signed)
Patient ID: Kevin Wallace, male   DOB: [birth year], 59 y.o.   MRN: [id number]  Post-op note  Subjective: The patient is doing well.  Nausea and bladder spasms.  Objective: Vital signs in last 24 hours: Temp:  [97.8 F (36.6 C)-98.3 F (36.8 C)] 97.8 F (36.6 C) (07/15 1458) Pulse Rate:  [50-75] 58 (07/15 1458) Resp:  [10-18] 18 (07/15 1458) BP: (123-163)/(61-86) 133/73 (07/15 1458) SpO2:  [93 %-100 %] 96 % (07/15 1458) Weight:  [90 kg-98 kg] 90 kg (07/15 1459)  Intake/Output from previous day: No intake/output data recorded. Intake/Output this shift: Total I/O In: 1962 [I.V.:1612; IV Piggyback:350] Out: 115 [Urine:50; Drains:40; Blood:25]  Physical Exam:  General: Alert and oriented. Abdomen: Soft, Nondistended. Incisions: Clean and dry. Urine: Mostly clear.  Lab Results: Recent Labs    02/28/20 1205  HGB 15.5  HCT 46.0    Assessment/Plan: POD#0   1) Continue to monitor, ambulate, IS   Pryor Curia. MD   LOS: 0 days   Dutch Gray 02/28/2020, 5:41 PM

## 2020-02-28 NOTE — Discharge Instructions (Signed)

## 2020-02-28 NOTE — Interval H&P Note (Signed)
History and Physical Interval Note:  02/28/2020 7:06 AM  Kevin Wallace  has presented today for surgery, with the diagnosis of PROSTATE CANCER.  The various methods of treatment have been discussed with the patient and family. After consideration of risks, benefits and other options for treatment, the patient has consented to  Procedure(s): XI ROBOTIC ASSISTED LAPAROSCOPIC RADICAL PROSTATECTOMY LEVEL 2 (N/A) LYMPHADENECTOMY, PELVIC (Bilateral) as a surgical intervention.  The patient's history has been reviewed, patient examined, no change in status, stable for surgery.  I have reviewed the patient's chart and labs.  Questions were answered to the patient's satisfaction.     [employer]

## 2020-02-28 NOTE — Op Note (Signed)
Preoperative diagnosis: Clinically localized adenocarcinoma of the prostate (clinical stage T1c Nx Mx)  Postoperative diagnosis: Clinically localized adenocarcinoma of the prostate (clinical stage T1c Nx Mx)  Procedure:  1. Robotic assisted laparoscopic radical prostatectomy (bilateral nerve sparing) 2. Bilateral robotic assisted laparoscopic pelvic lymphadenectomy  Surgeon: Pryor Curia. M.D.  Assistant: Debbrah Alar, PA-C  An assistant was required for this surgical procedure.  The duties of the assistant included but were not limited to suctioning, passing suture, camera manipulation, retraction. This procedure would not be able to be performed without an Environmental consultant.  Resident: Dr. Carmie Kanner  Anesthesia: General  Complications: None  EBL: 25 mL  IVF:  1300 mL crystalloid, 250 cc colloid  Specimens: 1. Prostate and seminal vesicles 2. Right pelvic lymph nodes 3. Left pelvic lymph nodes  Disposition of specimens: Pathology  Drains: 1. 20 Fr coude catheter 2. # 19 Blake pelvic drain  Indication: Kevin Wallace is a 59 y.o. year old patient with clinically localized prostate cancer.  After a thorough review of the management options for treatment of prostate cancer, he elected to proceed with surgical therapy and the above procedure(s).  We have discussed the potential benefits and risks of the procedure, side effects of the proposed treatment, the likelihood of the patient achieving the goals of the procedure, and any potential problems that might occur during the procedure or recuperation. Informed consent has been obtained.  Description of procedure:  The patient was taken to the operating room and a general anesthetic was administered. He was given preoperative antibiotics, placed in the dorsal lithotomy position, and prepped and draped in the usual sterile fashion. Next a preoperative timeout was performed. A urethral catheter was placed into the bladder and a  site was selected near the umbilicus for placement of the camera port. This was placed using a standard open Hassan technique which allowed entry into the peritoneal cavity under direct vision and without difficulty. An 8 mm robotic port was placed and a pneumoperitoneum established. The camera was then used to inspect the abdomen and there was no evidence of any intra-abdominal injuries or other abnormalities. The remaining abdominal ports were then placed. 8 mm robotic ports were placed in the right lower quadrant, left lower quadrant, and far left lateral abdominal wall. A 5 mm port was placed in the right upper quadrant and a 12 mm port was placed in the right lateral abdominal wall for laparoscopic assistance. All ports were placed under direct vision without difficulty. The surgical cart was then docked.   Utilizing the cautery scissors, the bladder was reflected posteriorly allowing entry into the space of Retzius and identification of the endopelvic fascia and prostate. The periprostatic fat was then removed from the prostate allowing full exposure of the endopelvic fascia. The endopelvic fascia was then incised from the apex back to the base of the prostate bilaterally and the underlying levator muscle fibers were swept laterally off the prostate thereby isolating the dorsal venous complex. The dorsal vein was then stapled and divided with a 45 mm Flex Echelon stapler. Attention then turned to the bladder neck which was divided anteriorly thereby allowing entry into the bladder and exposure of the urethral catheter. The catheter balloon was deflated and the catheter was brought into the operative field and used to retract the prostate anteriorly. The posterior bladder neck was then examined and was divided allowing further dissection between the bladder and prostate posteriorly until the vasa deferentia and seminal vessels were identified. The vasa  deferentia were isolated, divided, and lifted anteriorly.  The seminal vesicles were dissected down to their tips with care to control the seminal vascular arterial blood supply. These structures were then lifted anteriorly and the space between Denonvillier's fascia and the anterior rectum was developed with a combination of sharp and blunt dissection. This isolated the vascular pedicles of the prostate.  The lateral prostatic fascia was then sharply incised allowing release of the neurovascular bundles bilaterally. The vascular pedicles of the prostate were then ligated with Weck clips between the prostate and neurovascular bundles and divided with sharp cold scissor dissection resulting in neurovascular bundle preservation. The neurovascular bundles were then separated off the apex of the prostate and urethra bilaterally.  The urethra was then sharply transected allowing the prostate specimen to be disarticulated. The pelvis was copiously irrigated and hemostasis was ensured. There was no evidence for rectal injury.  Attention then turned to the right pelvic sidewall. The fibrofatty tissue between the external iliac vein, confluence of the iliac vessels, hypogastric artery, and Cooper's ligament was dissected free from the pelvic sidewall with care to preserve the obturator nerve. Weck clips were used for lymphostasis and hemostasis. An identical procedure was performed on the contralateral side and the lymphatic packets were removed for permanent pathologic analysis.  Attention then turned to the urethral anastomosis. A 2-0 Vicryl slip knot was placed between Denonvillier's fascia, the posterior bladder neck, and the posterior urethra to reapproximate these structures. A double-armed 3-0 Monocryl suture was then used to perform a 360 running tension-free anastomosis between the bladder neck and urethra. A new urethral catheter was then placed into the bladder and irrigated. There were no blood clots within the bladder and the anastomosis appeared to be  watertight. A #19 Blake drain was then brought through the left lateral 8 mm port site and positioned appropriately within the pelvis. It was secured to the skin with a nylon suture. The surgical cart was then undocked. The right lateral 12 mm port site was closed at the fascial level with a 0 Vicryl suture placed laparoscopically. All remaining ports were then removed under direct vision. The prostate specimen was removed intact within the Endopouch retrieval bag via the periumbilical camera port site. This fascial opening was closed with two running 0 Vicryl sutures. 0.25% Marcaine was then injected into all port sites and all incisions were reapproximated at the skin level with 4-0 Monocryl subcuticular sutures and Dermabond. The patient appeared to tolerate the procedure well and without complications. The patient was able to be extubated and transferred to the recovery unit in satisfactory condition.   Pryor Curia MD

## 2020-02-28 NOTE — Transfer of Care (Signed)
Immediate Anesthesia Transfer of Care Note  Patient: Kevin Wallace  Procedure(s) Performed: XI ROBOTIC ASSISTED LAPAROSCOPIC RADICAL PROSTATECTOMY LEVEL 2 (N/A ) LYMPHADENECTOMY, PELVIC (Bilateral )  Patient Location: PACU  Anesthesia Type:General  Level of Consciousness: awake, alert  and patient cooperative  Airway & Oxygen Therapy: Patient Spontanous Breathing and Patient connected to face mask oxygen  Post-op Assessment: Report given to RN and Post -op Vital signs reviewed and stable  Post vital signs: Reviewed and stable  Last Vitals:  Vitals Value Taken Time  BP 163/86 02/28/20 1156  Temp    Pulse 65 02/28/20 1158  Resp 12 02/28/20 1158  SpO2 100 % 02/28/20 1158  Vitals shown include unvalidated device data.  Last Pain:  Vitals:   02/28/20 0608  TempSrc:   PainSc: 0-No pain      Patients Stated Pain Goal: 4 (75/79/72 8206)  Complications: No complications documented.

## 2020-02-28 NOTE — Anesthesia Procedure Notes (Signed)
Procedure Name: Intubation Date/Time: 02/28/2020 9:00 AM Performed by: Eben Burow, CRNA Pre-anesthesia Checklist: Patient identified, Emergency Drugs available, Suction available, Patient being monitored and Timeout performed Patient Re-evaluated:Patient Re-evaluated prior to induction Oxygen Delivery Method: Circle system utilized Preoxygenation: Pre-oxygenation with 100% oxygen Induction Type: IV induction Ventilation: Mask ventilation without difficulty Laryngoscope Size: Glidescope and 4 Grade View: Grade I Tube type: Oral Number of attempts: 1 Airway Equipment and Method: Stylet Placement Confirmation: ETT inserted through vocal cords under direct vision,  positive ETCO2 and breath sounds checked- equal and bilateral Secured at: 23 cm Tube secured with: Tape Dental Injury: Teeth and Oropharynx as per pre-operative assessment  Difficulty Due To: Difficulty was anticipated and Difficult Airway- due to limited oral opening

## 2020-02-29 ENCOUNTER — Encounter (HOSPITAL_COMMUNITY): Payer: Self-pay | Admitting: Urology

## 2020-02-29 DIAGNOSIS — C61 Malignant neoplasm of prostate: Secondary | ICD-10-CM | POA: Diagnosis not present

## 2020-02-29 LAB — HEMOGLOBIN AND HEMATOCRIT, BLOOD
HCT: 41.3 % (ref 39.0–52.0)
Hemoglobin: 14.1 g/dL (ref 13.0–17.0)

## 2020-02-29 MED ORDER — BISACODYL 10 MG RE SUPP
10.0000 mg | Freq: Once | RECTAL | Status: AC
  Administered 2020-02-29: 10 mg via RECTAL
  Filled 2020-02-29: qty 1

## 2020-02-29 MED ORDER — TRAMADOL HCL 50 MG PO TABS: 50.0000 mg | ORAL_TABLET | Freq: Four times a day (QID) | ORAL | Status: DC | PRN

## 2020-02-29 NOTE — Progress Notes (Signed)
Patient ID: Kevin Wallace, male   DOB: [birth year], 59 y.o.   MRN: [id number]  1 Day Post-Op Subjective: The patient is doing well.  No nausea or vomiting. Pain is adequately controlled.  Objective: Vital signs in last 24 hours: Temp:  [97.8 F (36.6 C)-98.3 F (36.8 C)] 97.9 F (36.6 C) (07/16 0224) Pulse Rate:  [47-75] 47 (07/16 0224) Resp:  [10-20] 18 (07/16 0224) BP: (115-163)/(57-86) 115/57 (07/16 0224) SpO2:  [91 %-100 %] 95 % (07/16 0224) Weight:  [90 kg] 90 kg (07/15 1459)  Intake/Output from previous day: 07/15 0701 - 07/16 0700 In: 4112.5 [P.O.:500; I.V.:3262.5; IV Piggyback:350] Out: 2305 [Urine:2200; Drains:80; Blood:25] Intake/Output this shift: No intake/output data recorded.  Physical Exam:  General: Alert and oriented. CV: RRR Lungs: Clear bilaterally. GI: Soft, Nondistended. Incisions: Clean, dry, and intact Urine: Clear Extremities: Nontender, no erythema, no edema.  Lab Results: Recent Labs    02/28/20 1205 02/29/20 0453  HGB 15.5 14.1  HCT 46.0 41.3      Assessment/Plan: POD# 1 s/p robotic prostatectomy.  1) SL IVF 2) Ambulate, Incentive spirometry 3) Transition to oral pain medication 4) Dulcolax suppository 5) D/C pelvic drain 6) Plan for likely discharge later today   Kevin Wallace. MD   LOS: 0 days   Kevin Wallace 02/29/2020, 7:51 AM

## 2020-02-29 NOTE — Discharge Summary (Signed)
  Date of admission: 02/28/2020  Date of discharge: 02/29/2020  Admission diagnosis: Prostate Cancer  Discharge diagnosis: Prostate Cancer  History and Physical: For full details, please see admission history and physical. Briefly, Kevin Wallace is a 59 y.o. gentleman with localized prostate cancer.  After discussing management/treatment options, he elected to proceed with surgical treatment.  Hospital Course: Kevin Wallace was taken to the operating room on 02/28/2020 and underwent a robotic assisted laparoscopic radical prostatectomy. He tolerated this procedure well and without complications. Postoperatively, he was able to be transferred to a regular hospital room following recovery from anesthesia.  He was able to begin ambulating the night of surgery. He remained hemodynamically stable overnight.  He had excellent urine output with appropriately minimal output from his pelvic drain and his pelvic drain was removed on POD #1.  He was transitioned to oral pain medication, tolerated a clear liquid diet, and had met all discharge criteria and was able to be discharged home later on POD#1.  Laboratory values:  Recent Labs    02/28/20 1205 02/29/20 0453  HGB 15.5 14.1  HCT 46.0 41.3    Disposition: Home  Discharge instruction: He was instructed to be ambulatory but to refrain from heavy lifting, strenuous activity, or driving. He was instructed on urethral catheter care.  Discharge medications:   Allergies as of 02/29/2020      Reactions   Colchicine Diarrhea, Nausea And Vomiting      Medication List    STOP taking these medications   ibuprofen 200 MG tablet Commonly known as: ADVIL     TAKE these medications   ALLERGY EYE DROPS OP Place 1 drop into both eyes daily as needed (allergies).   allopurinol 300 MG tablet Commonly known as: ZYLOPRIM Take 300 mg by mouth daily.   Claritin 10 MG tablet Generic drug: loratadine Take 10 mg by mouth daily as needed for allergies.    NYQUIL PO Take 1 Dose by mouth at bedtime as needed (congestion).   sulfamethoxazole-trimethoprim 800-160 MG tablet Commonly known as: BACTRIM DS Take 1 tablet by mouth 2 (two) times daily. Start the day prior to foley removal appointment   traMADol 50 MG tablet Commonly known as: Ultram Take 1-2 tablets (50-100 mg total) by mouth every 6 (six) hours as needed for moderate pain or severe pain.       Followup: He will followup in 1 week for catheter removal and to discuss his surgical pathology results.

## 2020-02-29 NOTE — Plan of Care (Signed)

## 2020-03-04 LAB — SURGICAL PATHOLOGY

## 2020-06-27 ENCOUNTER — Other Ambulatory Visit: Payer: Self-pay | Admitting: Internal Medicine

## 2020-06-27 DIAGNOSIS — E785 Hyperlipidemia, unspecified: Secondary | ICD-10-CM

## 2020-09-05 ENCOUNTER — Ambulatory Visit
Admission: RE | Admit: 2020-09-05 | Discharge: 2020-09-05 | Disposition: A | Discharge status: Home / Self Care | Payer: No Typology Code available for payment source | Source: Ambulatory Visit | Attending: Internal Medicine | Admitting: Internal Medicine

## 2020-09-05 DIAGNOSIS — E785 Hyperlipidemia, unspecified: Secondary | ICD-10-CM

## 2021-08-26 ENCOUNTER — Other Ambulatory Visit: Payer: Self-pay | Admitting: Internal Medicine

## 2021-08-26 DIAGNOSIS — I7121 Aneurysm of the ascending aorta, without rupture: Secondary | ICD-10-CM

## 2021-08-30 ENCOUNTER — Other Ambulatory Visit: Payer: Self-pay

## 2021-08-30 ENCOUNTER — Encounter (HOSPITAL_COMMUNITY): Payer: Self-pay | Admitting: Emergency Medicine

## 2021-08-30 ENCOUNTER — Inpatient Hospital Stay (HOSPITAL_COMMUNITY)
Admission: EM | Admit: 2021-08-30 | Discharge: 2021-09-01 | DRG: 247 | Disposition: A | Discharge status: Home / Self Care | Payer: No Typology Code available for payment source | Attending: Cardiovascular Disease | Admitting: Cardiovascular Disease

## 2021-08-30 ENCOUNTER — Emergency Department (HOSPITAL_COMMUNITY): Payer: No Typology Code available for payment source

## 2021-08-30 DIAGNOSIS — R0683 Snoring: Secondary | ICD-10-CM | POA: Diagnosis present

## 2021-08-30 DIAGNOSIS — M109 Gout, unspecified: Secondary | ICD-10-CM | POA: Diagnosis present

## 2021-08-30 DIAGNOSIS — I10 Essential (primary) hypertension: Secondary | ICD-10-CM

## 2021-08-30 DIAGNOSIS — I214 Non-ST elevation (NSTEMI) myocardial infarction: Principal | ICD-10-CM

## 2021-08-30 DIAGNOSIS — R001 Bradycardia, unspecified: Secondary | ICD-10-CM | POA: Diagnosis present

## 2021-08-30 DIAGNOSIS — Z955 Presence of coronary angioplasty implant and graft: Secondary | ICD-10-CM

## 2021-08-30 DIAGNOSIS — Z20822 Contact with and (suspected) exposure to covid-19: Secondary | ICD-10-CM | POA: Diagnosis present

## 2021-08-30 DIAGNOSIS — E7800 Pure hypercholesterolemia, unspecified: Secondary | ICD-10-CM | POA: Diagnosis present

## 2021-08-30 DIAGNOSIS — I25110 Atherosclerotic heart disease of native coronary artery with unstable angina pectoris: Secondary | ICD-10-CM | POA: Diagnosis present

## 2021-08-30 HISTORY — DX: Atherosclerotic heart disease of native coronary artery without angina pectoris: I25.10

## 2021-08-30 LAB — CBC
HCT: 50.8 % (ref 39.0–52.0)
Hemoglobin: 17.5 g/dL — ABNORMAL HIGH (ref 13.0–17.0)
MCH: 31.3 pg (ref 26.0–34.0)
MCHC: 34.4 g/dL (ref 30.0–36.0)
MCV: 90.7 fL (ref 80.0–100.0)
Platelets: 355 10*3/uL (ref 150–400)
RBC: 5.6 MIL/uL (ref 4.22–5.81)
RDW: 12 % (ref 11.5–15.5)
WBC: 8.5 10*3/uL (ref 4.0–10.5)
nRBC: 0 % (ref 0.0–0.2)

## 2021-08-30 LAB — HIV ANTIBODY (ROUTINE TESTING W REFLEX): HIV Screen 4th Generation wRfx: NONREACTIVE

## 2021-08-30 LAB — BASIC METABOLIC PANEL
Anion gap: 10 (ref 5–15)
BUN: 14 mg/dL (ref 6–20)
CO2: 24 mmol/L (ref 22–32)
Calcium: 9.2 mg/dL (ref 8.9–10.3)
Chloride: 103 mmol/L (ref 98–111)
Creatinine, Ser: 1.04 mg/dL (ref 0.61–1.24)
GFR, Estimated: >60 mL/min (ref >60)
Glucose, Bld: 127 mg/dL — ABNORMAL HIGH (ref 70–99)
Potassium: 3.9 mmol/L (ref 3.5–5.1)
Sodium: 137 mmol/L (ref 135–145)

## 2021-08-30 LAB — RESP PANEL BY RT-PCR (FLU A&B, COVID) ARPGX2
Influenza A by PCR: NEGATIVE
Influenza B by PCR: NEGATIVE
SARS Coronavirus 2 by RT PCR: NEGATIVE

## 2021-08-30 LAB — TROPONIN I (HIGH SENSITIVITY)
Troponin I (High Sensitivity): 111 ng/L (ref <18)
Troponin I (High Sensitivity): 141 ng/L (ref <18)

## 2021-08-30 MED ORDER — NITROGLYCERIN 0.4 MG SL SUBL: 0.4000 mg | SUBLINGUAL_TABLET | SUBLINGUAL | Status: DC | PRN

## 2021-08-30 MED ORDER — SODIUM CHLORIDE 0.9 % IV SOLN: INTRAVENOUS | Status: DC

## 2021-08-30 MED ORDER — ACETAMINOPHEN 325 MG PO TABS: 650.0000 mg | ORAL_TABLET | ORAL | Status: DC | PRN

## 2021-08-30 MED ORDER — ALLOPURINOL 300 MG PO TABS
300.0000 mg | ORAL_TABLET | Freq: Every day | ORAL | Status: DC
  Administered 2021-08-30 – 2021-09-01 (×3): 300 mg via ORAL
  Filled 2021-08-30: qty 1
  Filled 2021-08-30 (×2): qty 3

## 2021-08-30 MED ORDER — HEPARIN (PORCINE) 25000 UT/250ML-% IV SOLN
1250.0000 [IU]/h | INTRAVENOUS | Status: DC
  Administered 2021-08-30: 17:00:00 1100 [IU]/h via INTRAVENOUS
  Filled 2021-08-30 (×2): qty 250

## 2021-08-30 MED ORDER — ASPIRIN 300 MG RE SUPP: 300.0000 mg | RECTAL | Status: AC

## 2021-08-30 MED ORDER — HEPARIN BOLUS VIA INFUSION
4000.0000 [IU] | Freq: Once | INTRAVENOUS | Status: AC
  Administered 2021-08-30: 4000 [IU] via INTRAVENOUS
  Filled 2021-08-30: qty 4000

## 2021-08-30 MED ORDER — ATORVASTATIN CALCIUM 40 MG PO TABS
40.0000 mg | ORAL_TABLET | Freq: Every day | ORAL | Status: DC
  Administered 2021-08-30 – 2021-08-31 (×2): 40 mg via ORAL
  Filled 2021-08-30 (×2): qty 1

## 2021-08-30 MED ORDER — NITROGLYCERIN 0.4 MG SL SUBL
SUBLINGUAL_TABLET | SUBLINGUAL | Status: AC
  Administered 2021-08-30: 0.4 mg via SUBLINGUAL
  Filled 2021-08-30: qty 1

## 2021-08-30 MED ORDER — ASPIRIN 81 MG PO CHEW
324.0000 mg | CHEWABLE_TABLET | Freq: Once | ORAL | Status: AC
  Administered 2021-08-30: 324 mg via ORAL

## 2021-08-30 MED ORDER — ONDANSETRON HCL 4 MG/2ML IJ SOLN
4.0000 mg | Freq: Four times a day (QID) | INTRAMUSCULAR | Status: DC | PRN
  Administered 2021-08-31: 4 mg via INTRAVENOUS
  Filled 2021-08-30: qty 2

## 2021-08-30 MED ORDER — ASPIRIN EC 81 MG PO TBEC
81.0000 mg | DELAYED_RELEASE_TABLET | Freq: Every day | ORAL | Status: DC
  Administered 2021-08-31 – 2021-09-01 (×2): 81 mg via ORAL
  Filled 2021-08-30 (×2): qty 1

## 2021-08-30 MED ORDER — ASPIRIN 81 MG PO CHEW: 324.0000 mg | CHEWABLE_TABLET | ORAL | Status: AC

## 2021-08-30 MED ORDER — ASPIRIN 81 MG PO CHEW
CHEWABLE_TABLET | ORAL | Status: AC
  Filled 2021-08-30: qty 4

## 2021-08-30 NOTE — ED Notes (Signed)
Received verbal report from Johna Sheriff at this time

## 2021-08-30 NOTE — H&P (Signed)
Cardiology Admission History and Physical:   Patient ID: Pamela Maddy MRN: [id number]; DOB: September 04, 1960   Admission date: 08/30/2021  PCP:  Ginger Organ., MD   [facility] Providers Cardiologist: Dr. Percival Spanish   Chief Complaint: Chest pain  Patient Profile:    Taedyn Glasscock is a 61 y.o. male with a hx of HLD who is being seen 08/30/2021 for the evaluation of Chest pain at the request of Dr. Sabra Heck.   History of Present Illness:    Mr. Kille 61 year old male, he has a history of hypercholesterolemia and gout, he does not smoke or drink, he lives a fairly active lifestyle.  He presents after having about 1 week of intermittent chest pain, he describes this as a slight heaviness or squeezing in his chest which seems to be intermittent, he notes that when he is at the driving range hitting golf balls it seems to come on, today while he was golfing it got much worse and seem to have some discomfort in his left arm Side office on.  No nausea no diaphoresis no shortness of breath.  He states that he is still having mild symptoms.  He did have a stress test in 2016 which was negative for ischemia with Dr. Percival Spanish. His Recent Ca score on CT 1/22 was 5.  He does not currently follow-up with cardiology as he has no other reason to.  In the ED his troponin was mildly elevated but currently the patient is chest pain-free.  His blood pressure is normal.  He had a calcium scoring by his primary care and was started on statins.   Past Medical History:  Diagnosis Date   Allergy    Cancer ([place]) 11/2019   Prostate Cancer -recently dx -surgery scheduled 1/82/99   Complication of anesthesia    took a long time to wear off    Gout    High cholesterol    diet controlled, no meds    Past Surgical History:  Procedure Laterality Date   ANKLE ARTHROSCOPY     "had it cleaned out"   COLONOSCOPY  10/14/2014   KNEE ARTHROSCOPY  07/2019   KNEE ARTHROSCOPY  1980s   LYMPHADENECTOMY Bilateral  02/28/2020   Procedure: LYMPHADENECTOMY, PELVIC;  Surgeon: Raynelle Bring, MD;  Location: [facility];  Service: Urology;  Laterality: Bilateral;   ROBOT ASSISTED LAPAROSCOPIC RADICAL PROSTATECTOMY N/A 02/28/2020   Procedure: XI ROBOTIC ASSISTED LAPAROSCOPIC RADICAL PROSTATECTOMY LEVEL 2;  Surgeon: Raynelle Bring, MD;  Location: [facility];  Service: Urology;  Laterality: N/A;     Medications Prior to Admission: Prior to Admission medications   Medication Sig Start Date End Date Taking? Authorizing Provider  allopurinol (ZYLOPRIM) 300 MG tablet Take 300 mg by mouth daily. 11/28/19   [provider]  Ketotifen Fumarate (ALLERGY EYE DROPS OP) Place 1 drop into both eyes daily as needed (allergies).    [provider]  loratadine (CLARITIN) 10 MG tablet Take 10 mg by mouth daily as needed for allergies.     [provider]  Pseudoeph-Doxylamine-DM-APAP (NYQUIL PO) Take 1 Dose by mouth at bedtime as needed (congestion).    [provider]  sulfamethoxazole-trimethoprim (BACTRIM DS) 800-160 MG tablet Take 1 tablet by mouth 2 (two) times daily. Start the day prior to foley removal appointment 02/28/20   Debbrah Alar, PA-C  traMADol (ULTRAM) 50 MG tablet Take 1-2 tablets (50-100 mg total) by mouth every 6 (six) hours as needed for moderate pain or severe pain. 02/28/20   Dancy,  Estill Bamberg, PA-C     Allergies:    Allergies  Allergen Reactions   Colchicine Diarrhea and Nausea And Vomiting    Social History:   Social History   Socioeconomic History   Marital status: Married    Spouse name: Not on file   Number of children: 2   Years of education: Not on file   Highest education level: Not on file  Occupational History   Not on file  Tobacco Use   Smoking status: Never   Smokeless tobacco: Never  Vaping Use   Vaping Use: Never used  Substance and Sexual Activity   Alcohol use: No    Alcohol/week: 0.0 standard drinks   Drug use: No   Sexual activity: Yes    Birth  control/protection: None  Other Topics Concern   Not on file  Social History Narrative   Lives with wife.     Social Determinants of Health   Financial Resource Strain: Not on file  Food Insecurity: Not on file  Transportation Needs: Not on file  Physical Activity: Not on file  Stress: Not on file  Social Connections: Not on file  Intimate Partner Violence: Not on file    Family History:   The patient's family history includes Rheumatic fever (age of onset: 7) in his father. There is no history of Colon cancer, Rectal cancer, Stomach cancer, or Esophageal cancer.    ROS:  Please see the history of present illness.  All other ROS reviewed and negative.     Physical Exam/Data:   Vitals:   08/30/21 1715 08/30/21 1730 08/30/21 1745 08/30/21 1800  BP: 135/77 129/77 129/77 130/76  Pulse: 61 (!) 54 (!) 55 (!) 56  Resp: 16 (!) 21 12 18   Temp:      TempSrc:      SpO2: 97% 96% 99% 97%  Weight:      Height:       No intake or output data in the 24 hours ending 08/30/21 1813 Last 3 Weights 08/30/2021 02/28/2020 02/28/2020  Weight (lbs) 197 lb 198 lb 6.6 oz 216 lb  Weight (kg) 89.359 kg 90 kg 97.977 kg     Body mass index is 27.48 kg/m.  General:  Well nourished, well developed, in no acute distress HEENT: normal Neck: no JVD Vascular: No carotid bruits; Distal pulses 2+ bilaterally   Cardiac:  normal S1, S2; RRR; no murmur  Lungs:  clear to auscultation bilaterally, no wheezing, rhonchi or rales  Abd: soft, nontender, no hepatomegaly  Ext: no edema Musculoskeletal:  No deformities, BUE and BLE strength normal and equal Skin: warm and dry  Neuro:  CNs 2-12 intact, no focal abnormalities noted Psych:  Normal affect    EKG:  The ECG that was done  was personally reviewed and demonstrates poor R wave progression in V1 to V4 with T wave inversion in V4 to V6.  This is new from the EKG done in 2016.  Relevant CV Studies: Calcium scoring done last year shows an Agatston score  of 5.9  Laboratory Data:  High Sensitivity Troponin:   Recent Labs  Lab 08/30/21 1449  TROPONINIHS 111*      Chemistry Recent Labs  Lab 08/30/21 1449  NA 137  K 3.9  CL 103  CO2 24  GLUCOSE 127*  BUN 14  CREATININE 1.04  CALCIUM 9.2  GFRNONAA >60  ANIONGAP 10    No results for input(s): PROT, ALBUMIN, AST, ALT, ALKPHOS, BILITOT in the last 168 hours.  Lipids No results for input(s): CHOL, TRIG, HDL, LABVLDL, LDLCALC, CHOLHDL in the last 168 hours. Hematology Recent Labs  Lab 08/30/21 1449  WBC 8.5  RBC 5.60  HGB 17.5*  HCT 50.8  MCV 90.7  MCH 31.3  MCHC 34.4  RDW 12.0  PLT 355   Thyroid No results for input(s): TSH, FREET4 in the last 168 hours. BNPNo results for input(s): BNP, PROBNP in the last 168 hours.  DDimer No results for input(s): DDIMER in the last 168 hours.   Radiology/Studies:  DG Chest 2 View  Result Date: 08/30/2021 CLINICAL DATA:  Intermittent chest pain for 1 week EXAM: CHEST - 2 VIEW COMPARISON:  None. FINDINGS: The heart size and mediastinal contours are within normal limits. Both lungs are clear. The visualized skeletal structures are unremarkable. IMPRESSION: No active cardiopulmonary disease. Electronically Signed   By: Randa Ngo M.D.   On: 08/30/2021 15:20     Assessment and Plan:   NSTEMI versus type II MI: Patient had atypical chest pain for last 1 week with mild troponin elevation and T wave inversions on V4 to V6.  He however has a calcium score of 5.9.  Clinically, patient satisfies the criteria for NSTEMI even though the calcium score is low.  We will plan for a cardiac catheterization for further evaluation.  We will treat for NSTEMI and hold off on Plavix until after the angiogram. Hypertension: Patient does not have a history and his blood pressure is borderline.   Risk Assessment/Risk Scores:    TIMI Risk Score for Unstable Angina or Non-ST Elevation MI:   The patient's TIMI risk score is 1, which indicates a 5% risk of  all cause mortality, new or recurrent myocardial infarction or need for urgent revascularization in the next 14 days.{       Severity of Illness: The appropriate patient status for this patient is OBSERVATION. Observation status is judged to be reasonable and necessary in order to provide the required intensity of service to ensure the patient's safety. The patient's presenting symptoms, physical exam findings, and initial radiographic and laboratory data in the context of their medical condition is felt to place them at decreased risk for further clinical deterioration. Furthermore, it is anticipated that the patient will be medically stable for discharge from the hospital within 2 midnights of admission.    For questions or updates, please contact Holly Please consult [email or web address] for contact info under     Signed, Robinette Haines, MD  08/30/2021 6:13 PM

## 2021-08-30 NOTE — ED Provider Notes (Signed)
[facility] EMERGENCY DEPARTMENT Provider Note   CSN: [id number] Arrival date & time: 08/30/21  1431     History  Chief Complaint  Patient presents with   Chest Pain    Kevin Wallace is a 61 y.o. male.   Chest Pain  61 year old male, he has a history of hypercholesterolemia and gout, he does not smoke or drink, he lives a fairly active lifestyle.  He presents after having about 1 week of intermittent chest pain, he describes this as a slight heaviness or squeezing in his chest which seems to be intermittent, he notes that when he is at the driving range hitting golf balls it seems to come on, today while he was golfing it got much worse and seem to have some discomfort in his left arm.  No nausea no diaphoresis no shortness of breath.  He states that he is still having mild symptoms.  He did have a stress test in 2016 which was unremarkable with Dr. Percival Spanish.  He does not currently follow-up with cardiology as he has no other reason to.  Home Medications Prior to Admission medications   Medication Sig Start Date End Date Taking? Authorizing Provider  allopurinol (ZYLOPRIM) 300 MG tablet Take 300 mg by mouth daily. 11/28/19   [provider]  Ketotifen Fumarate (ALLERGY EYE DROPS OP) Place 1 drop into both eyes daily as needed (allergies).    [provider]  loratadine (CLARITIN) 10 MG tablet Take 10 mg by mouth daily as needed for allergies.     [provider]  Pseudoeph-Doxylamine-DM-APAP (NYQUIL PO) Take 1 Dose by mouth at bedtime as needed (congestion).    [provider]  sulfamethoxazole-trimethoprim (BACTRIM DS) 800-160 MG tablet Take 1 tablet by mouth 2 (two) times daily. Start the day prior to foley removal appointment 02/28/20   Debbrah Alar, PA-C  traMADol (ULTRAM) 50 MG tablet Take 1-2 tablets (50-100 mg total) by mouth every 6 (six) hours as needed for moderate pain or severe pain. 02/28/20   Debbrah Alar, PA-C       Allergies    Colchicine    Review of Systems   Review of Systems  Cardiovascular:  Positive for chest pain.  All other systems reviewed and are negative.  Physical Exam Updated Vital Signs BP 137/83    Pulse 64    Temp 98.6 F (37 C) (Oral)    Resp 14    SpO2 94%  Physical Exam Vitals and nursing note reviewed.  Constitutional:      General: He is not in acute distress.    Appearance: He is well-developed.  HENT:     Head: Normocephalic and atraumatic.     Mouth/Throat:     Pharynx: No oropharyngeal exudate.  Eyes:     General: No scleral icterus.       Right eye: No discharge.        Left eye: No discharge.     Conjunctiva/sclera: Conjunctivae normal.     Pupils: Pupils are equal, round, and reactive to light.  Neck:     Thyroid: No thyromegaly.     Vascular: No JVD.  Cardiovascular:     Rate and Rhythm: Normal rate and regular rhythm.     Heart sounds: Normal heart sounds. No murmur heard.   No friction rub. No gallop.  Pulmonary:     Effort: Pulmonary effort is normal. No respiratory distress.     Breath sounds: Normal breath sounds. No wheezing or rales.  Chest:     Chest wall: No tenderness.  Abdominal:     General: Bowel sounds are normal. There is no distension.     Palpations: Abdomen is soft. There is no mass.     Tenderness: There is no abdominal tenderness.  Musculoskeletal:        General: No tenderness. Normal range of motion.     Cervical back: Normal range of motion and neck supple.     Right lower leg: No edema.     Left lower leg: No edema.  Lymphadenopathy:     Cervical: No cervical adenopathy.  Skin:    General: Skin is warm and dry.     Findings: No erythema or rash.  Neurological:     General: No focal deficit present.     Mental Status: He is alert.     Coordination: Coordination normal.  Psychiatric:        Behavior: Behavior normal.    ED Results / Procedures / Treatments   Labs (all labs ordered are listed, but only abnormal  results are displayed) Labs Reviewed  BASIC METABOLIC PANEL - Abnormal; Notable for the following components:      Result Value   Glucose, Bld 127 (*)    All other components within normal limits  CBC - Abnormal; Notable for the following components:   Hemoglobin 17.5 (*)    All other components within normal limits  TROPONIN I (HIGH SENSITIVITY) - Abnormal; Notable for the following components:   Troponin I (High Sensitivity) 111 (*)    All other components within normal limits  TROPONIN I (HIGH SENSITIVITY)    EKG EKG Interpretation  Date/Time:  Sunday August 30 2021 14:56:26 EST Ventricular Rate:  77 PR Interval:  168 QRS Duration: 90 QT Interval:  382 QTC Calculation: 432 R Axis:   9 Text Interpretation: Normal sinus rhythm Low voltage QRS Septal infarct , age undetermined T wave abnormality, consider lateral ischemia Abnormal ECG No previous ECGs available Confirmed by Noemi Chapel [id number]) on 08/30/2021 4:27:13 PM  Radiology DG Chest 2 View  Result Date: 08/30/2021 CLINICAL DATA:  Intermittent chest pain for 1 week EXAM: CHEST - 2 VIEW COMPARISON:  None. FINDINGS: The heart size and mediastinal contours are within normal limits. Both lungs are clear. The visualized skeletal structures are unremarkable. IMPRESSION: No active cardiopulmonary disease. Electronically Signed   By: Randa Ngo M.D.   On: 08/30/2021 15:20    Procedures .Critical Care Performed by: Noemi Chapel, MD Authorized by: Noemi Chapel, MD   Critical care provider statement:    Critical care time (minutes):  30   Critical care time was exclusive of:  Separately billable procedures and treating other patients and teaching time   Critical care was necessary to treat or prevent imminent or life-threatening deterioration of the following conditions:  Cardiac failure   Critical care was time spent personally by me on the following activities:  Development of treatment plan with patient or surrogate,  discussions with consultants, evaluation of patient's response to treatment, examination of patient, ordering and review of laboratory studies, ordering and review of radiographic studies, ordering and performing treatments and interventions, pulse oximetry, re-evaluation of patient's condition and review of old charts   I assumed direction of critical care for this patient from another provider in my specialty: no     Care discussed with: admitting provider   Comments:          Medications Ordered in ED Medications  aspirin chewable  tablet 324 mg (has no administration in time range)  0.9 %  sodium chloride infusion (has no administration in time range)  nitroGLYCERIN (NITROSTAT) SL tablet 0.4 mg (has no administration in time range)    ED Course/ Medical Decision Making/ A&P                           Medical Decision Making This patient has signs and symptoms of what appears to be an acute coronary syndrome.  His troponin has resulted at just over 100 suggestive that he is having acute myocardial infarction.  He is still having active pain and thus I have ordered aspirin, nitroglycerin and heparin drip as well as a consultation with cardiology.  The patient is critically ill and will need to be admitted to the hospital.  Problems Addressed: NSTEMI (non-ST elevated myocardial infarction) [facility]): acute illness or injury that poses a threat to life or bodily functions    Details: The patient has an acute myocardial infarction, has had some preceding unstable angina leading up this week.  Is being treated with heparin, aspirin, nitroglycerin, has some slight hypertension but with a heart rate of 64 avoiding a beta-blocker at this time, cardiology consult, they will come to admit Primary hypertension:    Details: Avoiding acute treatment given that it is only 137/83  Amount and/or Complexity of Data Reviewed Independent Historian:     Details: Daughter at the bedside corroborating the  patient story External Data Reviewed: radiology and notes.    Details: The patient had a stress test in 2016 per the medical record, it was negative at that time, this was a exercise stress test Labs: ordered. Decision-making details documented in ED Course.    Details: Abnormal troponin elevated at 111 Radiology: ordered and independent interpretation performed.    Details: No findings of acute pulmonary abnormalities, normal mediastinum ECG/medicine tests: ordered and independent interpretation performed. Decision-making details documented in ED Course.    Details: Abnormal T waves Discussion of management or test interpretation with external provider(s): Discussed with cardiology, they will come to admit  Risk Drug therapy requiring intensive monitoring for toxicity. Decision regarding hospitalization. Emergency major surgery. Risk Details: Patient likely needs to have a heart catheterization, I would consider this an emergency major procedure and though it does not need to happen emergently based on the patient's lack of STEMI findings on his EKG it will need to be performed in the hospital.  Will discuss with cardiology for  Critical Care Total time providing critical care: 30-74 minutes  I discussed the care with the cardiologist, they will come see the patient for admission.  Heparin has been started        Final Clinical Impression(s) / ED Diagnoses Final diagnoses:  NSTEMI (non-ST elevated myocardial infarction) ([place])  Primary hypertension     Noemi Chapel, MD 08/30/21 1711

## 2021-08-30 NOTE — ED Triage Notes (Signed)
Pt reports intermittent chest pain x 1 week that is worse at night when lying down. He thought it was acid reflux.  Today while playing golf he started having substernal chest pain and L elbow pain.  Denies sob, nausea, and vomiting.

## 2021-08-30 NOTE — ED Notes (Signed)
Dr. Sabra Heck and Charge notified of Trop 111.

## 2021-08-30 NOTE — ED Provider Triage Note (Signed)
Emergency Medicine Provider Triage Evaluation Note  Psalm Schappell , a 61 y.o. male  was evaluated in triage.  Pt complains of substernal chest pain characterized as a dull aching throbbing sensation.  This occurred while he was playing golf today.  He states over the last week he has been having these episodes intermittently 2 of which he was laying down at nighttime.  Thought it was reflux.  Currently rates it mild severity.  No other associated symptoms.  Review of Systems  Positive:  Negative: See above   Physical Exam  BP 140/87 (BP Location: Right Arm)    Pulse 81    Temp 98.6 F (37 C) (Oral)    Resp 16    SpO2 96%  Gen:   Awake, no distress   Resp:  Normal effort  MSK:   Moves extremities without difficulty  Other:    Medical Decision Making  Medically screening exam initiated at 2:55 PM.  Appropriate orders placed.  Luisdavid Hamblin was informed that the remainder of the evaluation will be completed by another provider, this initial triage assessment does not replace that evaluation, and the importance of remaining in the ED until their evaluation is complete.     Myna Bright Golden, [place] 08/30/21 1456

## 2021-08-30 NOTE — Progress Notes (Signed)
ANTICOAGULATION CONSULT NOTE - Initial Consult  Pharmacy Consult for Heparin Indication:  NSTEMI  Allergies  Allergen Reactions   Colchicine Diarrhea and Nausea And Vomiting    Patient Measurements:   Heparin Dosing Weight: 89.4 kg  Vital Signs: Temp: 98.6 F (37 C) (01/15 1452) Temp Source: Oral (01/15 1452) BP: 137/83 (01/15 1626) Pulse Rate: 64 (01/15 1626)  Labs: Recent Labs    08/30/21 1449  HGB 17.5*  HCT 50.8  PLT 355  CREATININE 1.04  TROPONINIHS 111*    CrCl cannot be calculated (Unknown ideal weight.).   Medical History: Past Medical History:  Diagnosis Date   Allergy    Cancer ([place]) 11/2019   Prostate Cancer -recently dx -surgery scheduled 11/11/17   Complication of anesthesia    took a long time to wear off    Gout    High cholesterol    diet controlled, no meds    Medications:  (Not in a hospital admission)  Scheduled:  Infusions:   sodium chloride     PRN: nitroGLYCERIN  Assessment: 12 yom with a history of hypercholesterolemia and gout. Patient is presenting with chest pain. Heparin per pharmacy consult placed for  NSTEMI .  Patient is not on anticoagulation prior to arrival.  Hgb 17.5; plt 355  Goal of Therapy:  Heparin level 0.3-0.7 units/ml Monitor platelets by anticoagulation protocol: Yes   Plan:  Give 4000 units bolus x 1 Start heparin infusion at 1100 units/hr Check anti-Xa level in 6 hours and daily while on heparin Continue to monitor H&H and platelets  Lorelei Pont, PharmD, BCPS 08/30/2021 4:46 PM ED Clinical Pharmacist -  [phone]

## 2021-08-31 ENCOUNTER — Other Ambulatory Visit (HOSPITAL_COMMUNITY): Payer: Self-pay

## 2021-08-31 ENCOUNTER — Inpatient Hospital Stay (HOSPITAL_COMMUNITY)
Admission: EM | Disposition: A | Discharge status: Home / Self Care | Payer: Self-pay | Source: Home / Self Care | Attending: Cardiovascular Disease

## 2021-08-31 DIAGNOSIS — I214 Non-ST elevation (NSTEMI) myocardial infarction: Secondary | ICD-10-CM | POA: Diagnosis not present

## 2021-08-31 DIAGNOSIS — R001 Bradycardia, unspecified: Secondary | ICD-10-CM | POA: Diagnosis not present

## 2021-08-31 DIAGNOSIS — R0683 Snoring: Secondary | ICD-10-CM

## 2021-08-31 DIAGNOSIS — Z20822 Contact with and (suspected) exposure to covid-19: Secondary | ICD-10-CM | POA: Diagnosis not present

## 2021-08-31 DIAGNOSIS — I2510 Atherosclerotic heart disease of native coronary artery without angina pectoris: Secondary | ICD-10-CM

## 2021-08-31 DIAGNOSIS — I25110 Atherosclerotic heart disease of native coronary artery with unstable angina pectoris: Secondary | ICD-10-CM | POA: Diagnosis not present

## 2021-08-31 DIAGNOSIS — M109 Gout, unspecified: Secondary | ICD-10-CM | POA: Diagnosis not present

## 2021-08-31 DIAGNOSIS — I10 Essential (primary) hypertension: Secondary | ICD-10-CM | POA: Diagnosis present

## 2021-08-31 DIAGNOSIS — E785 Hyperlipidemia, unspecified: Secondary | ICD-10-CM | POA: Diagnosis not present

## 2021-08-31 DIAGNOSIS — E7800 Pure hypercholesterolemia, unspecified: Secondary | ICD-10-CM | POA: Diagnosis not present

## 2021-08-31 HISTORY — PX: LEFT HEART CATH AND CORONARY ANGIOGRAPHY: CATH118249

## 2021-08-31 LAB — BASIC METABOLIC PANEL
Anion gap: 7 (ref 5–15)
BUN: 11 mg/dL (ref 6–20)
CO2: 24 mmol/L (ref 22–32)
Calcium: 8.3 mg/dL — ABNORMAL LOW (ref 8.9–10.3)
Chloride: 107 mmol/L (ref 98–111)
Creatinine, Ser: 0.77 mg/dL (ref 0.61–1.24)
GFR, Estimated: >60 mL/min (ref >60)
Glucose, Bld: 94 mg/dL (ref 70–99)
Potassium: 3.8 mmol/L (ref 3.5–5.1)
Sodium: 138 mmol/L (ref 135–145)

## 2021-08-31 LAB — CBC
HCT: 45.1 % (ref 39.0–52.0)
Hemoglobin: 15.1 g/dL (ref 13.0–17.0)
MCH: 31.1 pg (ref 26.0–34.0)
MCHC: 33.5 g/dL (ref 30.0–36.0)
MCV: 92.8 fL (ref 80.0–100.0)
Platelets: 280 10*3/uL (ref 150–400)
RBC: 4.86 MIL/uL (ref 4.22–5.81)
RDW: 12.2 % (ref 11.5–15.5)
WBC: 7.5 10*3/uL (ref 4.0–10.5)
nRBC: 0 % (ref 0.0–0.2)

## 2021-08-31 LAB — PROTIME-INR
INR: 1 (ref 0.8–1.2)
Prothrombin Time: 13.1 seconds (ref 11.4–15.2)

## 2021-08-31 LAB — POCT ACTIVATED CLOTTING TIME: Activated Clotting Time: 293 seconds

## 2021-08-31 LAB — TROPONIN I (HIGH SENSITIVITY)
Troponin I (High Sensitivity): 575 ng/L (ref <18)
Troponin I (High Sensitivity): 435 ng/L (ref <18)

## 2021-08-31 LAB — HEPARIN LEVEL (UNFRACTIONATED): Heparin Unfractionated: 0.25 IU/mL — ABNORMAL LOW (ref 0.30–0.70)

## 2021-08-31 SURGERY — LEFT HEART CATH AND CORONARY ANGIOGRAPHY
Anesthesia: LOCAL

## 2021-08-31 MED ORDER — SODIUM CHLORIDE 0.9% FLUSH
3.0000 mL | Freq: Two times a day (BID) | INTRAVENOUS | Status: DC
  Administered 2021-08-31 – 2021-09-01 (×2): 3 mL via INTRAVENOUS

## 2021-08-31 MED ORDER — HEPARIN SODIUM (PORCINE) 1000 UNIT/ML IJ SOLN
INTRAMUSCULAR | Status: DC | PRN
  Administered 2021-08-31 (×2): 5000 [IU] via INTRAVENOUS

## 2021-08-31 MED ORDER — MIDAZOLAM HCL 2 MG/2ML IJ SOLN
INTRAMUSCULAR | Status: AC
  Filled 2021-08-31: qty 2

## 2021-08-31 MED ORDER — TICAGRELOR 90 MG PO TABS
90.0000 mg | ORAL_TABLET | Freq: Two times a day (BID) | ORAL | Status: DC
  Administered 2021-08-31 – 2021-09-01 (×2): 90 mg via ORAL
  Filled 2021-08-31 (×2): qty 1

## 2021-08-31 MED ORDER — NITROGLYCERIN 1 MG/10 ML FOR IR/CATH LAB
INTRA_ARTERIAL | Status: AC
  Filled 2021-08-31: qty 10

## 2021-08-31 MED ORDER — SODIUM CHLORIDE 0.9 % WEIGHT BASED INFUSION: 3.0000 mL/kg/h | INTRAVENOUS | Status: DC

## 2021-08-31 MED ORDER — VERAPAMIL HCL 2.5 MG/ML IV SOLN
INTRAVENOUS | Status: AC
  Filled 2021-08-31: qty 2

## 2021-08-31 MED ORDER — FENTANYL CITRATE (PF) 100 MCG/2ML IJ SOLN
INTRAMUSCULAR | Status: AC
  Filled 2021-08-31: qty 2

## 2021-08-31 MED ORDER — TICAGRELOR 90 MG PO TABS
ORAL_TABLET | ORAL | Status: AC
  Filled 2021-08-31: qty 1

## 2021-08-31 MED ORDER — VERAPAMIL HCL 2.5 MG/ML IV SOLN
INTRAVENOUS | Status: DC | PRN
  Administered 2021-08-31: 10 mL via INTRA_ARTERIAL

## 2021-08-31 MED ORDER — HEPARIN (PORCINE) IN NACL 1000-0.9 UT/500ML-% IV SOLN
INTRAVENOUS | Status: DC | PRN
  Administered 2021-08-31 (×2): 500 mL

## 2021-08-31 MED ORDER — LIDOCAINE HCL (PF) 1 % IJ SOLN
INTRAMUSCULAR | Status: AC
  Filled 2021-08-31: qty 30

## 2021-08-31 MED ORDER — ROSUVASTATIN CALCIUM 20 MG PO TABS
40.0000 mg | ORAL_TABLET | Freq: Every day | ORAL | Status: DC
  Administered 2021-08-31: 40 mg via ORAL
  Filled 2021-08-31: qty 2

## 2021-08-31 MED ORDER — LIDOCAINE HCL (PF) 1 % IJ SOLN
INTRAMUSCULAR | Status: DC | PRN
  Administered 2021-08-31: 5 mL

## 2021-08-31 MED ORDER — HYDRALAZINE HCL 20 MG/ML IJ SOLN: 10.0000 mg | INTRAMUSCULAR | Status: AC | PRN

## 2021-08-31 MED ORDER — SODIUM CHLORIDE 0.9% FLUSH: 3.0000 mL | INTRAVENOUS | Status: DC | PRN

## 2021-08-31 MED ORDER — MIDAZOLAM HCL 2 MG/2ML IJ SOLN
INTRAMUSCULAR | Status: DC | PRN
  Administered 2021-08-31: 2 mg via INTRAVENOUS

## 2021-08-31 MED ORDER — SODIUM CHLORIDE 0.9 % IV SOLN: 250.0000 mL | INTRAVENOUS | Status: DC | PRN

## 2021-08-31 MED ORDER — SODIUM CHLORIDE 0.9 % IV SOLN: INTRAVENOUS | Status: AC

## 2021-08-31 MED ORDER — IOHEXOL 350 MG/ML SOLN
INTRAVENOUS | Status: DC | PRN
  Administered 2021-08-31: 120 mL via INTRACARDIAC

## 2021-08-31 MED ORDER — NITROGLYCERIN 1 MG/10 ML FOR IR/CATH LAB
INTRA_ARTERIAL | Status: DC | PRN
  Administered 2021-08-31: 200 ug via INTRACORONARY

## 2021-08-31 MED ORDER — HEPARIN BOLUS VIA INFUSION
2000.0000 [IU] | Freq: Once | INTRAVENOUS | Status: AC
Start: 2021-08-31 — End: 2021-08-31
  Administered 2021-08-31: 2000 [IU] via INTRAVENOUS
  Filled 2021-08-31: qty 2000

## 2021-08-31 MED ORDER — SODIUM CHLORIDE 0.9 % WEIGHT BASED INFUSION: 1.0000 mL/kg/h | INTRAVENOUS | Status: DC

## 2021-08-31 MED ORDER — TICAGRELOR 90 MG PO TABS
ORAL_TABLET | ORAL | Status: DC | PRN
  Administered 2021-08-31: 180 mg via ORAL

## 2021-08-31 MED ORDER — ASPIRIN 81 MG PO CHEW
81.0000 mg | CHEWABLE_TABLET | ORAL | Status: AC
  Administered 2021-08-31: 81 mg via ORAL
  Filled 2021-08-31: qty 1

## 2021-08-31 MED ORDER — FENTANYL CITRATE (PF) 100 MCG/2ML IJ SOLN
INTRAMUSCULAR | Status: DC | PRN
  Administered 2021-08-31: 50 ug via INTRAVENOUS

## 2021-08-31 MED ORDER — LABETALOL HCL 5 MG/ML IV SOLN: 10.0000 mg | INTRAVENOUS | Status: AC | PRN

## 2021-08-31 MED ORDER — HEPARIN SODIUM (PORCINE) 1000 UNIT/ML IJ SOLN
INTRAMUSCULAR | Status: AC
  Filled 2021-08-31: qty 10

## 2021-08-31 MED ORDER — HEPARIN (PORCINE) IN NACL 1000-0.9 UT/500ML-% IV SOLN
INTRAVENOUS | Status: AC
  Filled 2021-08-31: qty 1000

## 2021-08-31 SURGICAL SUPPLY — 19 items
BALL SAPPHIRE NC24 2.5X22 (BALLOONS) ×2
BALLN SAPPHIRE 2.0X15 (BALLOONS) ×2
BALLOON SAPPHIRE 2.0X15 (BALLOONS) IMPLANT
BALLOON SAPPHIRE NC24 2.5X22 (BALLOONS) IMPLANT
CATH 5FR JL3.5 JR4 ANG PIG MP (CATHETERS) ×1 IMPLANT
CATH VISTA GUIDE 6FR XBLAD3.5 (CATHETERS) ×1 IMPLANT
DEVICE RAD COMP TR BAND LRG (VASCULAR PRODUCTS) ×1 IMPLANT
GLIDESHEATH SLEND SS 6F .021 (SHEATH) ×1 IMPLANT
GUIDEWIRE INQWIRE 1.5J.035X260 (WIRE) IMPLANT
INQWIRE 1.5J .035X260CM (WIRE) ×2
KIT ENCORE 26 ADVANTAGE (KITS) ×1 IMPLANT
KIT HEART LEFT (KITS) ×2 IMPLANT
PACK CARDIAC CATHETERIZATION (CUSTOM PROCEDURE TRAY) ×2 IMPLANT
STENT SYNERGY XD 2.50X32 (Permanent Stent) IMPLANT
SYNERGY XD 2.50X32 (Permanent Stent) ×2 IMPLANT
SYR MEDRAD MARK 7 150ML (SYRINGE) ×2 IMPLANT
TRANSDUCER W/STOPCOCK (MISCELLANEOUS) ×2 IMPLANT
TUBING CIL FLEX 10 FLL-RA (TUBING) ×2 IMPLANT
WIRE COUGAR XT STRL 190CM (WIRE) ×1 IMPLANT

## 2021-08-31 NOTE — ED Notes (Signed)
Breakfast orders placed 

## 2021-08-31 NOTE — Interval H&P Note (Signed)
History and Physical Interval Note:  08/31/2021 2:15 PM  Kevin Wallace  has presented today for surgery, with the diagnosis of nstemi.  The various methods of treatment have been discussed with the patient and family. After consideration of risks, benefits and other options for treatment, the patient has consented to  Procedure(s): LEFT HEART CATH AND CORONARY ANGIOGRAPHY (N/A) as a surgical intervention.  The patient's history has been reviewed, patient examined, no change in status, stable for surgery.  I have reviewed the patient's chart and labs.  Questions were answered to the patient's satisfaction.    Cath Lab Visit (complete for each Cath Lab visit)  Clinical Evaluation Leading to the Procedure:   ACS: Yes.    Non-ACS:    Anginal Classification: CCS III  Anti-ischemic medical therapy: No Therapy  Non-Invasive Test Results: No non-invasive testing performed  Prior CABG: No previous CABG    [name]

## 2021-08-31 NOTE — Progress Notes (Signed)
TR BAND REMOVAL  LOCATION:    radial right radial  DEFLATED PER PROTOCOL:   yes  TIME BAND OFF / DRESSING APPLIED:    2010/gauze and tegaderm  SITE UPON ARRIVAL:    Level 0, tender to touch  SITE AFTER BAND REMOVAL:    Level 0  CIRCULATION SENSATION AND MOVEMENT:    Within Normal Limits : rt hand and fingers warm and pink, palpable rt radial pulse  COMMENTS:

## 2021-08-31 NOTE — H&P (View-Only) (Signed)
Progress Note  Patient Name: Kevin Wallace Date of Encounter: 08/31/2021  [facility] Cardiologist: None New, previously seen by Dr. Percival Spanish  Subjective   No recurrent chest pain or shortness of breath.  Inpatient Medications    Scheduled Meds:  allopurinol  300 mg Oral Daily   aspirin  324 mg Oral NOW   Or   aspirin  300 mg Rectal NOW   aspirin EC  81 mg Oral Daily   atorvastatin  40 mg Oral Daily   sodium chloride flush  3 mL Intravenous Q12H   Continuous Infusions:  sodium chloride 125 mL/hr at 08/31/21 0116   heparin 1,250 Units/hr (08/31/21 0536)   PRN Meds: acetaminophen, nitroGLYCERIN, ondansetron (ZOFRAN) IV   Vital Signs    Vitals:   08/31/21 0500 08/31/21 0615 08/31/21 0700 08/31/21 0715  BP: 104/68 106/65 112/63 110/74  Pulse: (!) 48 (!) 39 (!) 48 (!) 49  Resp: (!) 8 16 18 12   Temp:      TempSrc:      SpO2: 96% 92% 96% 97%  Weight:      Height:        Intake/Output Summary (Last 24 hours) at 08/31/2021 0738 Last data filed at 08/31/2021 0113 Gross per 24 hour  Intake 1002.16 ml  Output --  Net 1002.16 ml   Last 3 Weights 08/30/2021 02/28/2020 02/28/2020  Weight (lbs) 197 lb 198 lb 6.6 oz 216 lb  Weight (kg) 89.359 kg 90 kg 97.977 kg      Telemetry    Sinus bradycardia, personally Reviewed  ECG    N/A  Physical Exam   GEN: No acute distress.   Neck: No JVD Cardiac: RRR, no murmurs, rubs, or gallops.  Respiratory: Clear to auscultation bilaterally. GI: Soft, nontender, non-distended  MS: No edema; No deformity. Neuro:  Nonfocal  Psych: Normal affect   Labs    High Sensitivity Troponin:   Recent Labs  Lab 08/30/21 1449 08/30/21 1649  TROPONINIHS 111* 141*     Chemistry Recent Labs  Lab 08/30/21 1449 08/31/21 0452  NA 137 138  K 3.9 3.8  CL 103 107  CO2 24 24  GLUCOSE 127* 94  BUN 14 11  CREATININE 1.04 0.77  CALCIUM 9.2 8.3*  GFRNONAA >60 >60  ANIONGAP 10 7    Lipids No results for input(s): CHOL, TRIG, HDL,  LABVLDL, LDLCALC, CHOLHDL in the last 168 hours.  Hematology Recent Labs  Lab 08/30/21 1449 08/31/21 0452  WBC 8.5 7.5  RBC 5.60 4.86  HGB 17.5* 15.1  HCT 50.8 45.1  MCV 90.7 92.8  MCH 31.3 31.1  MCHC 34.4 33.5  RDW 12.0 12.2  PLT 355 280    Radiology    DG Chest 2 View  Result Date: 08/30/2021 CLINICAL DATA:  Intermittent chest pain for 1 week EXAM: CHEST - 2 VIEW COMPARISON:  None. FINDINGS: The heart size and mediastinal contours are within normal limits. Both lungs are clear. The visualized skeletal structures are unremarkable. IMPRESSION: No active cardiopulmonary disease. Electronically Signed   By: Randa Ngo M.D.   On: 08/30/2021 15:20    Cardiac Studies   Pending cath   Patient Profile     61 y.o. male with history of hyperlipidemia presented for evaluation of chest pain and found to have elevated troponin.  Assessment & Plan    Non-STEMI -1 to 2-week history of intermittent chest pain while playing golf.  Had a worse episode yesterday while playing fifth hole.  Chest pain  radiated to his left elbow.  Symptoms resolved after sublingual nitroglycerin.  Currently chest pain-free. -EKG with T wave inversion in lead V3 to V6. -High-sensitivity troponin 111>>141.  Pending repeat level this morning. - Shared Decision Making/Informed Consent The risks [stroke (1 in 1000), death (1 in 1000), kidney failure [usually temporary] (1 in 500), bleeding (1 in 200), allergic reaction [possibly serious] (1 in 200)], benefits (diagnostic support and management of coronary artery disease) and alternatives of a cardiac catheterization were discussed in detail with Kevin Wallace and he is willing to proceed.  -Continue aspirin and Lipitor 40 -No beta-blocker given baseline bradycardia  2.  Snoring/daytime sleepiness -Will require outpatient sleep study  3.  Hyperlipidemia -Patient reports his cholesterol has improved significantly on Lipitor 40 mg on recent check by PCP   For  questions or updates, please contact [place] Please consult [email or web address] for contact info under       Signed, [name], PA  08/31/2021, 7:38 AM      Agree with note by [name] PA-C  61 year old Caucasian male without prior cardiac history and minimal risk factors who was admitted with chest pain and non-STEMI.  Interestingly, he did have a coronary calcium score performed 09/05/2020 that was 6.  He is currently pain-free on IV heparin.  His EKG shows nonspecific findings with some subtle lateral T wave inversion and his enzymes are mildly elevated.  His exam is benign.  Plan for left heart cath this morning.  The patient understands that risks included but are not limited to stroke (1 in 1000), death (1 in 38), kidney failure [usually temporary] (1 in 500), bleeding (1 in 200), allergic reaction [possibly serious] (1 in 200).  The patient understands and agrees to proceed   Lorretta Harp, M.D., [place], [facility], [place], [place] [address] [place], [place]  [address]  [phone] 08/31/2021 8:12 AM

## 2021-08-31 NOTE — Progress Notes (Addendum)
Progress Note  Patient Name: Kevin Wallace Date of Encounter: 08/31/2021  [facility] Cardiologist: None New, previously seen by Dr. Percival Wallace  Subjective   No recurrent chest pain or shortness of breath.  Inpatient Medications    Scheduled Meds:  allopurinol  300 mg Oral Daily   aspirin  324 mg Oral NOW   Or   aspirin  300 mg Rectal NOW   aspirin EC  81 mg Oral Daily   atorvastatin  40 mg Oral Daily   sodium chloride flush  3 mL Intravenous Q12H   Continuous Infusions:  sodium chloride 125 mL/hr at 08/31/21 0116   heparin 1,250 Units/hr (08/31/21 0536)   PRN Meds: acetaminophen, nitroGLYCERIN, ondansetron (ZOFRAN) IV   Vital Signs    Vitals:   08/31/21 0500 08/31/21 0615 08/31/21 0700 08/31/21 0715  BP: 104/68 106/65 112/63 110/74  Pulse: (!) 48 (!) 39 (!) 48 (!) 49  Resp: (!) 8 16 18 12   Temp:      TempSrc:      SpO2: 96% 92% 96% 97%  Weight:      Height:        Intake/Output Summary (Last 24 hours) at 08/31/2021 0738 Last data filed at 08/31/2021 0113 Gross per 24 hour  Intake 1002.16 ml  Output --  Net 1002.16 ml   Last 3 Weights 08/30/2021 02/28/2020 02/28/2020  Weight (lbs) 197 lb 198 lb 6.6 oz 216 lb  Weight (kg) 89.359 kg 90 kg 97.977 kg      Telemetry    Sinus bradycardia, personally Reviewed  ECG    N/A  Physical Exam   GEN: No acute distress.   Neck: No JVD Cardiac: RRR, no murmurs, rubs, or gallops.  Respiratory: Clear to auscultation bilaterally. GI: Soft, nontender, non-distended  MS: No edema; No deformity. Neuro:  Nonfocal  Psych: Normal affect   Labs    High Sensitivity Troponin:   Recent Labs  Lab 08/30/21 1449 08/30/21 1649  TROPONINIHS 111* 141*     Chemistry Recent Labs  Lab 08/30/21 1449 08/31/21 0452  NA 137 138  K 3.9 3.8  CL 103 107  CO2 24 24  GLUCOSE 127* 94  BUN 14 11  CREATININE 1.04 0.77  CALCIUM 9.2 8.3*  GFRNONAA >60 >60  ANIONGAP 10 7    Lipids No results for input(s): CHOL, TRIG, HDL,  LABVLDL, LDLCALC, CHOLHDL in the last 168 hours.  Hematology Recent Labs  Lab 08/30/21 1449 08/31/21 0452  WBC 8.5 7.5  RBC 5.60 4.86  HGB 17.5* 15.1  HCT 50.8 45.1  MCV 90.7 92.8  MCH 31.3 31.1  MCHC 34.4 33.5  RDW 12.0 12.2  PLT 355 280    Radiology    DG Chest 2 View  Result Date: 08/30/2021 CLINICAL DATA:  Intermittent chest pain for 1 week EXAM: CHEST - 2 VIEW COMPARISON:  None. FINDINGS: The heart size and mediastinal contours are within normal limits. Both lungs are clear. The visualized skeletal structures are unremarkable. IMPRESSION: No active cardiopulmonary disease. Electronically Signed   By: Randa Ngo M.D.   On: 08/30/2021 15:20    Cardiac Studies   Pending cath   Patient Profile     61 y.o. male with history of hyperlipidemia presented for evaluation of chest pain and found to have elevated troponin.  Assessment & Plan    Non-STEMI -1 to 2-week history of intermittent chest pain while playing golf.  Had a worse episode yesterday while playing fifth hole.  Chest pain  radiated to his left elbow.  Symptoms resolved after sublingual nitroglycerin.  Currently chest pain-free. -EKG with T wave inversion in lead V3 to V6. -High-sensitivity troponin 111>>141.  Pending repeat level this morning. - Shared Decision Making/Informed Consent The risks [stroke (1 in 1000), death (1 in 1000), kidney failure [usually temporary] (1 in 500), bleeding (1 in 200), allergic reaction [possibly serious] (1 in 200)], benefits (diagnostic support and management of coronary artery disease) and alternatives of a cardiac catheterization were discussed in detail with Kevin Wallace and he is willing to proceed.  -Continue aspirin and Lipitor 40 -No beta-blocker given baseline bradycardia  2.  Snoring/daytime sleepiness -Will require outpatient sleep study  3.  Hyperlipidemia -Patient reports his cholesterol has improved significantly on Lipitor 40 mg on recent check by PCP   For  questions or updates, please contact [place] Please consult [email or web address] for contact info under       Signed, [name], PA  08/31/2021, 7:38 AM      Agree with note by Kevin Lis PA-C  61 year old Caucasian male without prior cardiac history and minimal risk factors who was admitted with chest pain and non-STEMI.  Interestingly, he did have a coronary calcium score performed 09/05/2020 that was 6.  He is currently pain-free on IV heparin.  His EKG shows nonspecific findings with some subtle lateral T wave inversion and his enzymes are mildly elevated.  His exam is benign.  Plan for left heart cath this morning.  The patient understands that risks included but are not limited to stroke (1 in 1000), death (1 in 16), kidney failure [usually temporary] (1 in 500), bleeding (1 in 200), allergic reaction [possibly serious] (1 in 200).  The patient understands and agrees to proceed   Lorretta Harp, M.D., [place], [facility], [place], [place] [address] [place], [place]  [address]  [phone] 08/31/2021 8:12 AM

## 2021-08-31 NOTE — Progress Notes (Signed)
ANTICOAGULATION CONSULT NOTE   Pharmacy Consult for Heparin Indication:  NSTEMI  Allergies  Allergen Reactions   Colchicine Diarrhea and Nausea And Vomiting    Patient Measurements: Height: 5\' 11"  (180.3 cm) Weight: 89.4 kg (197 lb) IBW/kg (Calculated) : 75.3 Heparin Dosing Weight: 89.4 kg  Vital Signs: BP: 104/68 (01/16 0500) Pulse Rate: 48 (01/16 0500)  Labs: Recent Labs    08/30/21 1449 08/30/21 1649 08/31/21 0452  HGB 17.5*  --  15.1  HCT 50.8  --  45.1  PLT 355  --  280  HEPARINUNFRC  --   --  0.25*  CREATININE 1.04  --  0.77  TROPONINIHS 111* 141*  --      Estimated Creatinine Clearance: 104.6 mL/min (by C-G formula based on SCr of 0.77 mg/dL).   Medical History: Past Medical History:  Diagnosis Date   Allergy    Cancer ([place]) 11/2019   Prostate Cancer -recently dx -surgery scheduled 3/33/54   Complication of anesthesia    took a long time to wear off    Gout    High cholesterol    diet controlled, no meds    Medications:  (Not in a hospital admission)  Scheduled:   allopurinol  300 mg Oral Daily   aspirin  324 mg Oral NOW   Or   aspirin  300 mg Rectal NOW   aspirin EC  81 mg Oral Daily   atorvastatin  40 mg Oral Daily   heparin  2,000 Units Intravenous Once   Infusions:   sodium chloride 125 mL/hr at 08/31/21 0116   heparin 1,100 Units/hr (08/30/21 1711)   PRN: acetaminophen, nitroGLYCERIN, ondansetron (ZOFRAN) IV  Assessment: 67 yom with a history of hypercholesterolemia and gout. Patient is presenting with chest pain. Heparin per pharmacy consult placed for  NSTEMI .  Patient is not on anticoagulation prior to arrival.  Hgb 17.5; plt 355  1/16 AM update:  Heparin level below goal  Goal of Therapy:  Heparin level 0.3-0.7 units/ml Monitor platelets by anticoagulation protocol: Yes   Plan:  Give Heparin 2000 units bolus x 1 Inc heparin to 1250 units/hr Check anti-Xa level in 6-8 hours and daily while on heparin Continue to  monitor H&H and platelets  Narda Bonds, PharmD, BCPS Clinical Pharmacist Phone: [phone]

## 2021-08-31 NOTE — TOC Benefit Eligibility Note (Signed)
Patient Teacher, English as a foreign language completed.    The patient is currently admitted and upon discharge could be taking Brilinta 90 mg.  The current 30 day co-pay is, $70.00.   The patient is insured through [place], [place] Patient Advocate Specialist [place] Patient Advocate Team Direct Number: [phone]  Fax: [phone]

## 2021-09-01 ENCOUNTER — Inpatient Hospital Stay (HOSPITAL_COMMUNITY): Payer: No Typology Code available for payment source

## 2021-09-01 ENCOUNTER — Encounter (HOSPITAL_COMMUNITY): Payer: Self-pay | Admitting: Cardiovascular Disease

## 2021-09-01 ENCOUNTER — Other Ambulatory Visit (HOSPITAL_COMMUNITY): Payer: Self-pay

## 2021-09-01 DIAGNOSIS — I214 Non-ST elevation (NSTEMI) myocardial infarction: Secondary | ICD-10-CM

## 2021-09-01 LAB — LIPID PANEL
Cholesterol: 117 mg/dL (ref 0–200)
HDL: 35 mg/dL — ABNORMAL LOW (ref >40)
LDL Cholesterol: 65 mg/dL (ref 0–99)
Total CHOL/HDL Ratio: 3.3 RATIO
Triglycerides: 85 mg/dL (ref <150)
VLDL: 17 mg/dL (ref 0–40)

## 2021-09-01 LAB — BASIC METABOLIC PANEL
Anion gap: 8 (ref 5–15)
BUN: 7 mg/dL (ref 6–20)
CO2: 24 mmol/L (ref 22–32)
Calcium: 8.7 mg/dL — ABNORMAL LOW (ref 8.9–10.3)
Chloride: 106 mmol/L (ref 98–111)
Creatinine, Ser: 0.84 mg/dL (ref 0.61–1.24)
GFR, Estimated: >60 mL/min (ref >60)
Glucose, Bld: 97 mg/dL (ref 70–99)
Potassium: 4 mmol/L (ref 3.5–5.1)
Sodium: 138 mmol/L (ref 135–145)

## 2021-09-01 LAB — ECHOCARDIOGRAM COMPLETE
AR max vel: 2.74 cm2
AV Peak grad: 5.5 mmHg
Ao pk vel: 1.17 m/s
Area-P 1/2: 2.95 cm2
Calc EF: 51.8 %
Height: 72 in
S' Lateral: 3.5 cm
Single Plane A2C EF: 49.7 %
Single Plane A4C EF: 51.5 %
Weight: 3187.2 oz

## 2021-09-01 LAB — CBC
HCT: 46.5 % (ref 39.0–52.0)
Hemoglobin: 15.5 g/dL (ref 13.0–17.0)
MCH: 30.8 pg (ref 26.0–34.0)
MCHC: 33.3 g/dL (ref 30.0–36.0)
MCV: 92.3 fL (ref 80.0–100.0)
Platelets: 302 10*3/uL (ref 150–400)
RBC: 5.04 MIL/uL (ref 4.22–5.81)
RDW: 12.5 % (ref 11.5–15.5)
WBC: 7.9 10*3/uL (ref 4.0–10.5)
nRBC: 0 % (ref 0.0–0.2)

## 2021-09-01 LAB — HEMOGLOBIN A1C
Hgb A1c MFr Bld: 5.4 % (ref 4.8–5.6)
Mean Plasma Glucose: 108.28 mg/dL

## 2021-09-01 MED ORDER — MELATONIN 3 MG PO TABS: 3.0000 mg | ORAL_TABLET | Freq: Every day | ORAL | Status: DC

## 2021-09-01 MED ORDER — TICAGRELOR 90 MG PO TABS
90.0000 mg | ORAL_TABLET | Freq: Two times a day (BID) | ORAL | 3 refills | Status: DC
  Filled 2021-09-01: qty 60, 30d supply, fill #0

## 2021-09-01 MED ORDER — ASPIRIN 81 MG PO TBEC
81.0000 mg | DELAYED_RELEASE_TABLET | Freq: Every day | ORAL | 3 refills | Status: DC
  Filled 2021-09-01: qty 90, 90d supply, fill #0

## 2021-09-01 MED ORDER — PERFLUTREN LIPID MICROSPHERE
1.0000 mL | INTRAVENOUS | Status: AC | PRN
  Administered 2021-09-01: 2 mL via INTRAVENOUS
  Filled 2021-09-01: qty 10

## 2021-09-01 MED ORDER — ROSUVASTATIN CALCIUM 40 MG PO TABS
40.0000 mg | ORAL_TABLET | Freq: Every day | ORAL | 3 refills | Status: DC
  Filled 2021-09-01: qty 30, 30d supply, fill #0

## 2021-09-01 MED ORDER — NITROGLYCERIN 0.4 MG SL SUBL
0.4000 mg | SUBLINGUAL_TABLET | SUBLINGUAL | 12 refills | Status: AC | PRN
Start: 2021-09-01 — End: ?
  Filled 2021-09-01: qty 25, 7d supply, fill #0

## 2021-09-01 NOTE — Progress Notes (Signed)
Progress Note  Patient Name: Kevin Wallace Date of Encounter: 09/01/2021  [facility] Cardiologist: Dr. Quay Burow  Subjective   No recurrent chest pain or shortness of breath.  Postop day 1 LAD stent  Inpatient Medications    Scheduled Meds:  allopurinol  300 mg Oral Daily   aspirin EC  81 mg Oral Daily   melatonin  3 mg Oral QHS   rosuvastatin  40 mg Oral QHS   sodium chloride flush  3 mL Intravenous Q12H   sodium chloride flush  3 mL Intravenous Q12H   ticagrelor  90 mg Oral BID   Continuous Infusions:  sodium chloride 125 mL/hr at 08/31/21 0116   sodium chloride     PRN Meds: sodium chloride, acetaminophen, nitroGLYCERIN, ondansetron (ZOFRAN) IV, sodium chloride flush   Vital Signs    Vitals:   08/31/21 2010 08/31/21 2036 08/31/21 2058 09/01/21 0437  BP: (!) 146/74 132/79 132/71 117/75  Pulse: (!) 47 (!) 51 (!) 52 (!) 53  Resp: 12 18 18 18   Temp:  98.2 F (36.8 C)  97.9 F (36.6 C)  TempSrc:  Oral  Oral  SpO2: 97% 98% 95% 97%  Weight:  90.4 kg    Height:  6' (1.829 m)      Intake/Output Summary (Last 24 hours) at 09/01/2021 0936 Last data filed at 09/01/2021 0032 Gross per 24 hour  Intake 2327.28 ml  Output 775 ml  Net 1552.28 ml   Last 3 Weights 08/31/2021 08/30/2021 02/28/2020  Weight (lbs) 199 lb 3.2 oz 197 lb 198 lb 6.6 oz  Weight (kg) 90.357 kg 89.359 kg 90 kg      Telemetry    Sinus bradycardia, personally Reviewed  ECG    Normal sinus rhythm at 44 sinus bradycardia sinus bradycardia 44 with septal Q waves and lateral T wave inversion.-Personally reviewed  Physical Exam   GEN: No acute distress.   Neck: No JVD Cardiac: RRR, no murmurs, rubs, or gallops.  Respiratory: Clear to auscultation bilaterally. GI: Soft, nontender, non-distended  MS: No edema; No deformity. Neuro:  Nonfocal  Psych: Normal affect   Labs    High Sensitivity Troponin:   Recent Labs  Lab 08/30/21 1449 08/30/21 1649 08/31/21 0725 08/31/21 1005   TROPONINIHS 111* 141* 575* 435*     Chemistry Recent Labs  Lab 08/30/21 1449 08/31/21 0452 09/01/21 0404  NA 137 138 138  K 3.9 3.8 4.0  CL 103 107 106  CO2 24 24 24   GLUCOSE 127* 94 97  BUN 14 11 7   CREATININE 1.04 0.77 0.84  CALCIUM 9.2 8.3* 8.7*  GFRNONAA >60 >60 >60  ANIONGAP 10 7 8     Lipids  Recent Labs  Lab 09/01/21 0404  CHOL 117  TRIG 85  HDL 35*  LDLCALC 65  CHOLHDL 3.3    Hematology Recent Labs  Lab 08/30/21 1449 08/31/21 0452 09/01/21 0404  WBC 8.5 7.5 7.9  RBC 5.60 4.86 5.04  HGB 17.5* 15.1 15.5  HCT 50.8 45.1 46.5  MCV 90.7 92.8 92.3  MCH 31.3 31.1 30.8  MCHC 34.4 33.5 33.3  RDW 12.0 12.2 12.5  PLT 355 280 302    Radiology    DG Chest 2 View  Result Date: 08/30/2021 CLINICAL DATA:  Intermittent chest pain for 1 week EXAM: CHEST - 2 VIEW COMPARISON:  None. FINDINGS: The heart size and mediastinal contours are within normal limits. Both lungs are clear. The visualized skeletal structures are unremarkable. IMPRESSION: No active cardiopulmonary disease.  Electronically Signed   By: Randa Ngo M.D.   On: 08/30/2021 15:20   CARDIAC CATHETERIZATION  Result Date: 08/31/2021   Prox RCA lesion is 20% stenosed.   Mid RCA lesion is 20% stenosed.   Ost Cx to Prox Cx lesion is 30% stenosed.   1st Mrg lesion is 30% stenosed.   2nd Diag lesion is 50% stenosed.   Mid LAD lesion is 99% stenosed.   A drug-eluting stent was successfully placed using a SYNERGY XD 2.50X32.   Post intervention, there is a 0% residual stenosis.   The left ventricular systolic function is normal.   LV end diastolic pressure is normal.   The left ventricular ejection fraction is 50-55% by visual estimate.   There is no mitral valve regurgitation. Severe mid LAD stenosis just beyond a moderate caliber diagonal branch. The diagonal branch has moderate ostial stenosis. Successful PTCA/DES x 1 mid LAD Mild proximal Circumflex stenosis Large dominant RCA with mild proximal and mild distal  stenosis Preserved LV systolic function with hypokinesis of the apex. Recommendations: Will continue DAPT with ASA and Brilinta for one year. Continue statin. No beta blocker currently secondary to bradycardia. Echo in the am.    Cardiac Studies   Cardiac catheterization/PCI and stent (08/31/2021)  Conclusion      Prox RCA lesion is 20% stenosed.   Mid RCA lesion is 20% stenosed.   Ost Cx to Prox Cx lesion is 30% stenosed.   1st Mrg lesion is 30% stenosed.   2nd Diag lesion is 50% stenosed.   Mid LAD lesion is 99% stenosed.   A drug-eluting stent was successfully placed using a SYNERGY XD 2.50X32.   Post intervention, there is a 0% residual stenosis.   The left ventricular systolic function is normal.   LV end diastolic pressure is normal.   The left ventricular ejection fraction is 50-55% by visual estimate.   There is no mitral valve regurgitation.   Severe mid LAD stenosis just beyond a moderate caliber diagonal branch. The diagonal branch has moderate ostial stenosis.  Successful PTCA/DES x 1 mid LAD Mild proximal Circumflex stenosis Large dominant RCA with mild proximal and mild distal stenosis Preserved LV systolic function with hypokinesis of the apex.    Recommendations: Will continue DAPT with ASA and Brilinta for one year. Continue statin. No beta blocker currently secondary to bradycardia. Echo in the am.   Coronary Diagrams  Diagnostic Dominance: Right Intervention   Patient Profile     61 y.o. male with history of hyperlipidemia presented for evaluation of chest pain and found to have elevated troponin.  Had PCI and drug-eluting stent of mid LAD by Dr. Angelena Form yesterday.  Assessment & Plan    Non-STEMI -1 to 2-week history of intermittent chest pain while playing golf.  Had a worse episode yesterday while playing fifth hole.  Chest pain radiated to his left elbow.  Symptoms resolved after sublingual nitroglycerin.  Currently chest pain-free. -EKG with T wave  inversion in lead V3 to V6. -High-sensitivity troponin 111>>141>>575 .  -   -Continue aspirin 40 -No beta-blocker given baseline bradycardia -Cardiac catheterization performed yesterday by Dr. Angelena Form revealed a 99% mid LAD lesion which underwent PCI and drug-eluting stenting with a 2.5 mm x 33 mm long Synergy drug-eluting stent.  He had some scattered insignificant disease otherwise.  He had some apical hypokinesia.  2D echo is pending.  He will need at least 12 months of uninterrupted DAPT (aspirin 81 mg and Brilinta twice daily.  2.  Snoring/daytime sleepiness -Will require outpatient sleep study  3.  Hyperlipidemia -Crestor dose increased from 10 to 40 mg a day.  Postop day #1 PCI drug-eluting stenting of mid LAD in the setting of non-STEMI.  EF was preserved by left ventriculography although 2D echo is pending.  On appropriate medications.  Pain-free this morning.  Ambulating with cardiac rehab.  Anticipate discharge home later this afternoon.  TOC 7 followed by return office visit with me in 4 to 6 weeks.  For questions or updates, please contact [place] Please consult [email or web address] for contact info under       Signed, Quay Burow, MD  09/01/2021, 9:36 AM

## 2021-09-01 NOTE — Plan of Care (Signed)
°  Problem: Education: Goal: Understanding of CV disease, CV risk reduction, and recovery process will improve Outcome: Progressing   Problem: Cardiovascular: Goal: Ability to achieve and maintain adequate cardiovascular perfusion will improve Outcome: Progressing Goal: Vascular access site(s) Level 0-1 will be maintained Outcome: Progressing   Problem: Health Behavior/Discharge Planning: Goal: Ability to safely manage health-related needs after discharge will improve Outcome: Progressing

## 2021-09-01 NOTE — Progress Notes (Incomplete)
Hello,  The Pharmacy team is conducting a discharge transitions of care quality improvement initiative. The recommendations below are for your consideration.    Kevin Wallace is a 61 y.o. male (MRN: [id number], DOB: [birth year]) who was recently hospitalized on 08/30/2021 for ***. They are anticipated to visit your clinic for post-discharge follow-up and may benefit from assistance with medication initiation and/or access.     Relevant medication access issues which may benefit from further intervention include:   Please consider the following therapy recommendations at follow-up appointment below:     Other relevant medication issues from their recent admission include:   We appreciate your assistance with the implementation of these recommendations. Please let us know if there is anything we can help you with at this time.       Thanks!   Georgina Peer  09/01/2021 12:26 PM

## 2021-09-01 NOTE — Plan of Care (Signed)

## 2021-09-01 NOTE — Progress Notes (Signed)
20:25 Pt received from cath lab .   20:36 Vitals WNL for pt.  20:45 Pt requested to ambulate to bathroom for BM. Upon standing & beginning to walk pt c/o dizziness & nausea. Pt was assisted back to bed. During said event HR on telemetry monitor dropped briefly to 38 & then returned to pt's normal rate of ~ 50. 4mg  of IV zofran administered. Pt reported feeling better & no further episodes noted during remainder of shift.

## 2021-09-01 NOTE — Progress Notes (Signed)
CARDIAC REHAB PHASE I   PRE:  Rate/Rhythm: 55 SB    BP: sitting 136/78    SaO2:   MODE:  Ambulation: 430 ft   POST:  Rate/Rhythm: 76 SR    BP: sitting 153/81     SaO2:    Pt ambulated without c/o. Discussed with pt, wife, and daughter MI, stent, Brilinta importance, restrictions, exercise, diet, NTG, and CRPII. Pt receptive. Will refer to [place].  [id number]  Rosa Sanchez, ACSM 09/01/2021 10:17 AM

## 2021-09-01 NOTE — Discharge Summary (Addendum)
Discharge Summary    Patient ID: Kevin Wallace MRN: [id number]; DOB: 12-17-1960  Admit date: 08/30/2021 Discharge date: 09/01/2021  PCP:  Ginger Organ., MD   [facility] Providers Cardiologist:  Quay Burow, MD   {  Discharge Diagnoses    Principal Problem:   NSTEMI (non-ST elevated myocardial infarction) [facility]) Active Problems:   CAD   HLD   Snoring/daytime sleepiness  Diagnostic Studies/Procedures    Echo 09/01/21  1. Left ventricular ejection fraction, by estimation, is 50 to 55%. The  left ventricle has low normal function. The left ventricle demonstrates  regional wall motion abnormalities (see scoring diagram/findings for  description). Left ventricular diastolic   parameters were normal.   2. Right ventricular systolic function is normal. The right ventricular  size is normal. Tricuspid regurgitation signal is inadequate for assessing  PA pressure.   3. The mitral valve is grossly normal. Trivial mitral valve  regurgitation. No evidence of mitral stenosis.   4. The aortic valve is tricuspid. There is mild calcification of the  aortic valve. Aortic valve regurgitation is not visualized. Aortic valve  sclerosis is present, with no evidence of aortic valve stenosis.   5. The inferior vena cava is normal in size with greater than 50%  respiratory variability, suggesting right atrial pressure of 3 mmHg.   Cardiac catheterization/PCI and stent (08/31/2021)   Conclusion       Prox RCA lesion is 20% stenosed.   Mid RCA lesion is 20% stenosed.   Ost Cx to Prox Cx lesion is 30% stenosed.   1st Mrg lesion is 30% stenosed.   2nd Diag lesion is 50% stenosed.   Mid LAD lesion is 99% stenosed.   A drug-eluting stent was successfully placed using a SYNERGY XD 2.50X32.   Post intervention, there is a 0% residual stenosis.   The left ventricular systolic function is normal.   LV end diastolic pressure is normal.   The left ventricular ejection fraction is 50-55%  by visual estimate.   There is no mitral valve regurgitation.   Severe mid LAD stenosis just beyond a moderate caliber diagonal branch. The diagonal branch has moderate ostial stenosis.  Successful PTCA/DES x 1 mid LAD Mild proximal Circumflex stenosis Large dominant RCA with mild proximal and mild distal stenosis Preserved LV systolic function with hypokinesis of the apex.    Recommendations: Will continue DAPT with ASA and Brilinta for one year. Continue statin. No beta blocker currently secondary to bradycardia. Echo in the am.    Coronary Diagrams   Diagnostic Dominance: Right Intervention       History of Present Illness     Kevin Wallace is a 61 y.o. male with history of hyperlipidemia presented for evaluation of chest pain and found to have elevated troponin.  Presented with 1to 2-week history of intermittent chest pain while playing golf. Described pain and pressure or squeezing. Had a worse episode while playing fifth hole gold.  Chest pain radiated to his left elbow.  Symptoms resolved after sublingual nitroglycerin in ER.   Hospital Course     Consultants: None   Non-STEMI -EKG with T wave inversion in lead V3 to V6. -High-sensitivity troponin peaked at 575 then trended down. Treated with IV heparin.  - Cath showed severe mid LAD stenosis just beyond a moderate caliber diagonal branch. The diagonal branch has moderate ostial stenosis. S/p Successful PTCA/DES x 1 mid LAD. Otherwise mild non obstructive CAD. There was mild hypokinesis at Apex. Echo showed low normal  LVEF at 50-55% with regional WM abnormality. No complications. Ambuated well. CRP II as outpatient.  -Continue aspirin and Brilinta  -No beta-blocker given baseline bradycardia - Continue high intensity statin   2.  Snoring/daytime sleepiness -Will require outpatient sleep study   3.  Hyperlipidemia 09/01/2021: Cholesterol 117; HDL 35; LDL Cholesterol 65; Triglycerides 85; VLDL 17  - Continue Statin  (Crestor increased from 10 to 40 mg qd)  Did the patient have an acute coronary syndrome (MI, NSTEMI, STEMI, etc) this admission?:  Yes                               AHA/ACC Clinical Performance & Quality Measures: Aspirin prescribed? - Yes ADP Receptor Inhibitor (Plavix/Clopidogrel, Brilinta/Ticagrelor or Effient/Prasugrel) prescribed (includes medically managed patients)? - Yes Beta Blocker prescribed? - No - Baseline bradycardia  High Intensity Statin (Lipitor 40-80mg  or Crestor 20-40mg ) prescribed? - Yes EF assessed during THIS hospitalization? - Yes For EF <40%, was ACEI/ARB prescribed? - Not Applicable (EF >/= 46%) For EF <40%, Aldosterone Antagonist (Spironolactone or Eplerenone) prescribed? - Not Applicable (EF >/= 27%) Cardiac Rehab Phase II ordered (including medically managed patients)? - Yes   The patient will be scheduled for a TOC follow up appointment in 10 days.  A message has been sent to the [facility] and Scheduling Pool at the office where the patient should be seen for follow up.   Discharge Vitals Blood pressure 137/77, pulse (!) 49, temperature 98.1 F (36.7 C), temperature source Oral, resp. rate 16, height 6' (1.829 m), weight 90.4 kg, SpO2 97 %.  Filed Weights   08/30/21 1651 08/31/21 2036  Weight: 89.4 kg 90.4 kg    Labs & Radiologic Studies    CBC Recent Labs    08/31/21 0452 09/01/21 0404  WBC 7.5 7.9  HGB 15.1 15.5  HCT 45.1 46.5  MCV 92.8 92.3  PLT 280 035   Basic Metabolic Panel Recent Labs    08/31/21 0452 09/01/21 0404  NA 138 138  K 3.8 4.0  CL 107 106  CO2 24 24  GLUCOSE 94 97  BUN 11 7  CREATININE 0.77 0.84  CALCIUM 8.3* 8.7*   High Sensitivity Troponin:   Recent Labs  Lab 08/30/21 1449 08/30/21 1649 08/31/21 0725 08/31/21 1005  TROPONINIHS 111* 141* 575* 435*   Hemoglobin A1C Recent Labs    09/01/21 0404  HGBA1C 5.4   Fasting Lipid Panel Recent Labs    09/01/21 0404  CHOL 117  HDL 35*  LDLCALC 65  TRIG 85   CHOLHDL 3.3   _____________  DG Chest 2 View  Result Date: 08/30/2021 CLINICAL DATA:  Intermittent chest pain for 1 week EXAM: CHEST - 2 VIEW COMPARISON:  None. FINDINGS: The heart size and mediastinal contours are within normal limits. Both lungs are clear. The visualized skeletal structures are unremarkable. IMPRESSION: No active cardiopulmonary disease. Electronically Signed   By: Randa Ngo M.D.   On: 08/30/2021 15:20   CARDIAC CATHETERIZATION  Result Date: 08/31/2021   Prox RCA lesion is 20% stenosed.   Mid RCA lesion is 20% stenosed.   Ost Cx to Prox Cx lesion is 30% stenosed.   1st Mrg lesion is 30% stenosed.   2nd Diag lesion is 50% stenosed.   Mid LAD lesion is 99% stenosed.   A drug-eluting stent was successfully placed using a SYNERGY XD 2.50X32.   Post intervention, there is a 0% residual stenosis.  The left ventricular systolic function is normal.   LV end diastolic pressure is normal.   The left ventricular ejection fraction is 50-55% by visual estimate.   There is no mitral valve regurgitation. Severe mid LAD stenosis just beyond a moderate caliber diagonal branch. The diagonal branch has moderate ostial stenosis. Successful PTCA/DES x 1 mid LAD Mild proximal Circumflex stenosis Large dominant RCA with mild proximal and mild distal stenosis Preserved LV systolic function with hypokinesis of the apex. Recommendations: Will continue DAPT with ASA and Brilinta for one year. Continue statin. No beta blocker currently secondary to bradycardia. Echo in the am.   ECHOCARDIOGRAM COMPLETE  Result Date: 09/01/2021    ECHOCARDIOGRAM REPORT   Patient Name:   Kevin Wallace Date of Exam: 09/01/2021 Medical Rec #:  [id number]      Height:       72.0 in Accession #:    [id number]     Weight:       199.2 lb Date of Birth:  01-30-1961     BSA:          2.127 m Patient Age:    1 years       BP:           132/71 mmHg Patient Gender: M              HR:           60 bpm. Exam Location:  Inpatient  Procedure: 2D Echo, Cardiac Doppler, Color Doppler and Intracardiac            Opacification Agent Indications:    NSTEMI  History:        Patient has no prior history of Echocardiogram examinations.                 Signs/Symptoms:Chest Pain; Risk Factors:Hypertension.  Sonographer:    Jyl Heinz Referring Phys: [id number] Crista Luria BHAGAT IMPRESSIONS  1. Left ventricular ejection fraction, by estimation, is 50 to 55%. The left ventricle has low normal function. The left ventricle demonstrates regional wall motion abnormalities (see scoring diagram/findings for description). Left ventricular diastolic  parameters were normal.  2. Right ventricular systolic function is normal. The right ventricular size is normal. Tricuspid regurgitation signal is inadequate for assessing PA pressure.  3. The mitral valve is grossly normal. Trivial mitral valve regurgitation. No evidence of mitral stenosis.  4. The aortic valve is tricuspid. There is mild calcification of the aortic valve. Aortic valve regurgitation is not visualized. Aortic valve sclerosis is present, with no evidence of aortic valve stenosis.  5. The inferior vena cava is normal in size with greater than 50% respiratory variability, suggesting right atrial pressure of 3 mmHg. FINDINGS  Left Ventricle: Left ventricular ejection fraction, by estimation, is 50 to 55%. The left ventricle has low normal function. The left ventricle demonstrates regional wall motion abnormalities. The left ventricular internal cavity size was normal in size. There is no left ventricular hypertrophy. Left ventricular diastolic parameters were normal.  LV Wall Scoring: The apical septal segment and apex are hypokinetic. Right Ventricle: The right ventricular size is normal. No increase in right ventricular wall thickness. Right ventricular systolic function is normal. Tricuspid regurgitation signal is inadequate for assessing PA pressure. Left Atrium: Left atrial size was normal in size.  Right Atrium: Right atrial size was normal in size. Pericardium: There is no evidence of pericardial effusion. Presence of epicardial fat layer. Mitral Valve: The mitral valve is grossly normal. Trivial mitral valve regurgitation. No  evidence of mitral valve stenosis. Tricuspid Valve: The tricuspid valve is grossly normal. Tricuspid valve regurgitation is trivial. No evidence of tricuspid stenosis. Aortic Valve: The aortic valve is tricuspid. There is mild calcification of the aortic valve. Aortic valve regurgitation is not visualized. Aortic valve sclerosis is present, with no evidence of aortic valve stenosis. Aortic valve peak gradient measures 5.5 mmHg. Pulmonic Valve: The pulmonic valve was grossly normal. Pulmonic valve regurgitation is not visualized. No evidence of pulmonic stenosis. Aorta: The aortic root and ascending aorta are structurally normal, with no evidence of dilitation. Venous: The inferior vena cava is normal in size with greater than 50% respiratory variability, suggesting right atrial pressure of 3 mmHg. IAS/Shunts: The atrial septum is grossly normal.  LEFT VENTRICLE PLAX 2D LVIDd:         5.20 cm      Diastology LVIDs:         3.50 cm      LV e' medial:    7.83 cm/s LV PW:         1.00 cm      LV E/e' medial:  5.8 LV IVS:        1.30 cm      LV e' lateral:   10.40 cm/s LVOT diam:     2.00 cm      LV E/e' lateral: 4.4 LV SV:         69 LV SV Index:   32 LVOT Area:     3.14 cm  LV Volumes (MOD) LV vol d, MOD A2C: 115.0 ml LV vol d, MOD A4C: 158.0 ml LV vol s, MOD A2C: 57.8 ml LV vol s, MOD A4C: 76.7 ml LV SV MOD A2C:     57.2 ml LV SV MOD A4C:     158.0 ml LV SV MOD BP:      71.3 ml RIGHT VENTRICLE             IVC RV Basal diam:  4.10 cm     IVC diam: 1.40 cm RV Mid diam:    3.90 cm RV S prime:     10.70 cm/s TAPSE (M-mode): 1.9 cm LEFT ATRIUM             Index        RIGHT ATRIUM           Index LA diam:        4.00 cm 1.88 cm/m   RA Area:     17.00 cm LA Vol (A2C):   50.2 ml 23.60 ml/m   RA Volume:   46.70 ml  21.96 ml/m LA Vol (A4C):   40.8 ml 19.18 ml/m LA Biplane Vol: 45.9 ml 21.58 ml/m  AORTIC VALVE AV Area (Vmax): 2.74 cm AV Vmax:        117.00 cm/s AV Peak Grad:   5.5 mmHg LVOT Vmax:      102.00 cm/s LVOT Vmean:     69.900 cm/s LVOT VTI:       0.220 m  AORTA Ao Root diam: 3.20 cm Ao Asc diam:  3.40 cm MITRAL VALVE MV Area (PHT): 2.95 cm    SHUNTS MV Decel Time: 257 msec    Systemic VTI:  0.22 m MV E velocity: 45.40 cm/s  Systemic Diam: 2.00 cm MV A velocity: 64.30 cm/s MV E/A ratio:  0.71 Eleonore Chiquito MD Electronically signed by Eleonore Chiquito MD Signature Date/Time: 09/01/2021/12:27:22 PM    Final    Disposition   Pt  is being discharged home today in good condition.  Follow-up Plans & Appointments     Follow-up Information     Almyra Deforest, [place] Follow up on 09/10/2021.   Specialties: Cardiology, Radiology Why: @10 :55am for hospital follow up with Dr. Kennon Holter PA Contact information: [address] [place] [address] [phone]                Discharge Instructions     Amb Referral to Cardiac Rehabilitation   Complete by: As directed    Diagnosis:  Coronary Stents NSTEMI PTCA     After initial evaluation and assessments completed: Virtual Based Care may be provided alone or in conjunction with Phase 2 Cardiac Rehab based on patient barriers.: Yes   Diet - low sodium heart healthy   Complete by: As directed    Discharge instructions   Complete by: As directed    NO HEAVY LIFTING (>10lbs) X 2 WEEKS. NO SEXUAL ACTIVITY X 2 WEEKS. NO DRIVING X 1 WEEK. NO SOAKING BATHS, HOT TUBS, POOLS, ETC., X 7 DAYS.  Return to work after seen in clinic.   Increase activity slowly   Complete by: As directed        Discharge Medications   Allergies as of 09/01/2021       Reactions   Colchicine Diarrhea, Nausea And Vomiting        Medication List     STOP taking these medications    sulfamethoxazole-trimethoprim 800-160 MG  tablet Commonly known as: BACTRIM DS   traMADol 50 MG tablet Commonly known as: Ultram       TAKE these medications    allopurinol 300 MG tablet Commonly known as: ZYLOPRIM Take 300 mg by mouth daily.   aspirin 81 MG EC tablet Take 1 tablet (81 mg total) by mouth daily. Swallow whole. Start taking on: September 02, 2021   nitroGLYCERIN 0.4 MG SL tablet Commonly known as: NITROSTAT Place 1 tablet (0.4 mg total) under the tongue every 5 (five) minutes x 3 doses as needed for chest pain.   rosuvastatin 40 MG tablet Commonly known as: CRESTOR Take 1 tablet (40 mg total) by mouth daily. What changed:  medication strength how much to take   ticagrelor 90 MG Tabs tablet Commonly known as: BRILINTA Take 1 tablet (90 mg total) by mouth 2 (two) times daily.           Outstanding Labs/Studies   Consider OP f/u labs 6-8 weeks given statin initiation this admission.  Sleep study  Duration of Discharge Encounter   Greater than 30 minutes including physician time.  SignedCrista Luria Rockwell, PA 09/01/2021, 1:02 PM  Agree with note by [name] PA-C  Mr. Cowie was admitted with non-STEMI.  He underwent cardiac catheterization by Dr. Angelena Form revealing a high-grade mid LAD lesion which was stented successfully.  He had no other significant obstructive disease.  He did have some apical hypokinesia.  He is pain-free this morning.  His radial puncture site is stable.  His exam is benign.  He stable for discharge on dual antiplatelet therapy (aspirin Brilinta) for 12 months uninterrupted.  We will arrange TOC 7 followed by return office visit with me in 4 to 6 weeks.  Lorretta Harp, M.D., [place], [facility], [place] [address] [place], [place]  [address]  [phone] 09/01/2021 3:38 PM

## 2021-09-02 MED FILL — Nitroglycerin IV Soln 100 MCG/ML in D5W: INTRA_ARTERIAL | Qty: 10 | Status: AC

## 2021-09-04 ENCOUNTER — Other Ambulatory Visit (HOSPITAL_COMMUNITY): Payer: Self-pay

## 2021-09-05 ENCOUNTER — Emergency Department (HOSPITAL_COMMUNITY)
Admission: EM | Admit: 2021-09-05 | Discharge: 2021-09-05 | Disposition: A | Discharge status: Home / Self Care | Payer: No Typology Code available for payment source | Attending: Emergency Medicine | Admitting: Emergency Medicine

## 2021-09-05 ENCOUNTER — Other Ambulatory Visit: Payer: Self-pay

## 2021-09-05 ENCOUNTER — Emergency Department (HOSPITAL_COMMUNITY): Payer: No Typology Code available for payment source

## 2021-09-05 DIAGNOSIS — Z8546 Personal history of malignant neoplasm of prostate: Secondary | ICD-10-CM | POA: Insufficient documentation

## 2021-09-05 DIAGNOSIS — R0789 Other chest pain: Secondary | ICD-10-CM | POA: Diagnosis not present

## 2021-09-05 DIAGNOSIS — I2510 Atherosclerotic heart disease of native coronary artery without angina pectoris: Secondary | ICD-10-CM | POA: Insufficient documentation

## 2021-09-05 DIAGNOSIS — R059 Cough, unspecified: Secondary | ICD-10-CM | POA: Diagnosis not present

## 2021-09-05 DIAGNOSIS — R110 Nausea: Secondary | ICD-10-CM | POA: Diagnosis not present

## 2021-09-05 DIAGNOSIS — R55 Syncope and collapse: Secondary | ICD-10-CM | POA: Diagnosis present

## 2021-09-05 DIAGNOSIS — R079 Chest pain, unspecified: Secondary | ICD-10-CM

## 2021-09-05 LAB — COMPREHENSIVE METABOLIC PANEL
ALT: 43 U/L (ref 0–44)
AST: 41 U/L (ref 15–41)
Albumin: 3.9 g/dL (ref 3.5–5.0)
Alkaline Phosphatase: 68 U/L (ref 38–126)
Anion gap: 12 (ref 5–15)
BUN: 21 mg/dL — ABNORMAL HIGH (ref 6–20)
CO2: 21 mmol/L — ABNORMAL LOW (ref 22–32)
Calcium: 9.1 mg/dL (ref 8.9–10.3)
Chloride: 100 mmol/L (ref 98–111)
Creatinine, Ser: 1.16 mg/dL (ref 0.61–1.24)
GFR, Estimated: >60 mL/min (ref >60)
Glucose, Bld: 115 mg/dL — ABNORMAL HIGH (ref 70–99)
Potassium: 3.8 mmol/L (ref 3.5–5.1)
Sodium: 133 mmol/L — ABNORMAL LOW (ref 135–145)
Total Bilirubin: 0.9 mg/dL (ref 0.3–1.2)
Total Protein: 7.1 g/dL (ref 6.5–8.1)

## 2021-09-05 LAB — CBC
HCT: 50.8 % (ref 39.0–52.0)
Hemoglobin: 17.2 g/dL — ABNORMAL HIGH (ref 13.0–17.0)
MCH: 30.9 pg (ref 26.0–34.0)
MCHC: 33.9 g/dL (ref 30.0–36.0)
MCV: 91.2 fL (ref 80.0–100.0)
Platelets: 354 10*3/uL (ref 150–400)
RBC: 5.57 MIL/uL (ref 4.22–5.81)
RDW: 12.1 % (ref 11.5–15.5)
WBC: 10.4 10*3/uL (ref 4.0–10.5)
nRBC: 0 % (ref 0.0–0.2)

## 2021-09-05 LAB — TROPONIN I (HIGH SENSITIVITY)
Troponin I (High Sensitivity): 11 ng/L (ref <18)
Troponin I (High Sensitivity): 11 ng/L (ref <18)

## 2021-09-05 LAB — PROTIME-INR
INR: 1 (ref 0.8–1.2)
Prothrombin Time: 13.2 seconds (ref 11.4–15.2)

## 2021-09-05 MED ORDER — AZITHROMYCIN 250 MG PO TABS: ORAL_TABLET | ORAL | 0 refills | Status: DC

## 2021-09-05 MED ORDER — AMOXICILLIN 500 MG PO CAPS: 500.0000 mg | ORAL_CAPSULE | Freq: Three times a day (TID) | ORAL | 0 refills | Status: AC

## 2021-09-05 NOTE — ED Provider Notes (Signed)
[facility] Emergency Department Provider Note MRN:  [id number]  Arrival date & time: 09/05/21     Chief Complaint   Chest Pain and Loss of Consciousness   History of Present Illness   Kevin Wallace is a 61 y.o. year-old male with a history of CAD presenting to the ED with chief complaint of chest pain and loss of consciousness.  Chest pain starting at 11 PM this evening.  Described as mild, soreness.  Took a nitroglycerin.  5 minutes later had to use the bathroom and so tried to stand up and then got.  Dizzy and had to lay down.  Continued chest soreness/pressure.  Had some nausea.  Stent placed in heart 1 week ago  Review of Systems  A thorough review of systems was obtained and all systems are negative except as noted in the HPI and PMH.   Patient's Health History    Past Medical History:  Diagnosis Date   Allergy    CAD (coronary artery disease)    s/p DES to mLAD 08/31/21   Cancer ([place]) 11/2019   Prostate Cancer -recently dx -surgery scheduled 8/85/02   Complication of anesthesia    took a long time to wear off    Gout    High cholesterol    diet controlled, no meds    Past Surgical History:  Procedure Laterality Date   ANKLE ARTHROSCOPY     "had it cleaned out"   COLONOSCOPY  10/14/2014   KNEE ARTHROSCOPY  07/2019   KNEE ARTHROSCOPY  1980s   LEFT HEART CATH AND CORONARY ANGIOGRAPHY N/A 08/31/2021   Procedure: LEFT HEART CATH AND CORONARY ANGIOGRAPHY;  Surgeon: Burnell Blanks, MD;  Location: [facility];  Service: Cardiovascular;  Laterality: N/A;   LYMPHADENECTOMY Bilateral 02/28/2020   Procedure: LYMPHADENECTOMY, PELVIC;  Surgeon: Raynelle Bring, MD;  Location: [facility];  Service: Urology;  Laterality: Bilateral;   ROBOT ASSISTED LAPAROSCOPIC RADICAL PROSTATECTOMY N/A 02/28/2020   Procedure: XI ROBOTIC ASSISTED LAPAROSCOPIC RADICAL PROSTATECTOMY LEVEL 2;  Surgeon: Raynelle Bring, MD;  Location: [facility];  Service: Urology;   Laterality: N/A;    Family History  Problem Relation Age of Onset   Rheumatic fever Father 9       Died from complications of RF but might have been a heart attack   Colon cancer Neg Hx    Rectal cancer Neg Hx    Stomach cancer Neg Hx    Esophageal cancer Neg Hx     Social History   Socioeconomic History   Marital status: Married    Spouse name: Not on file   Number of children: 2   Years of education: Not on file   Highest education level: Not on file  Occupational History   Not on file  Tobacco Use   Smoking status: Never   Smokeless tobacco: Never  Vaping Use   Vaping Use: Never used  Substance and Sexual Activity   Alcohol use: No    Alcohol/week: 0.0 standard drinks   Drug use: No   Sexual activity: Yes    Birth control/protection: None  Other Topics Concern   Not on file  Social History Narrative   Lives with wife.     Social Determinants of Health   Financial Resource Strain: Not on file  Food Insecurity: Not on file  Transportation Needs: Not on file  Physical Activity: Not on file  Stress: Not on file  Social Connections: Not on file  Intimate Partner  Violence: Not on file     Physical Exam   Vitals:   09/05/21 0400 09/05/21 0430  BP: 125/84 127/87  Pulse: (!) 54 (!) 55  Resp: 15 17  Temp:    SpO2: 92% 91%    CONSTITUTIONAL: Well-appearing, NAD NEURO/PSYCH:  Alert and oriented x 3, no focal deficits EYES:  eyes equal and reactive ENT/NECK:  no LAD, no JVD CARDIO: Regular rate, well-perfused, normal S1 and S2 PULM:  CTAB no wheezing or rhonchi GI/GU:  non-distended, non-tender MSK/SPINE:  No gross deformities, no edema SKIN:  no rash, atraumatic   *Additional and/or pertinent findings included in MDM below  Diagnostic and Interventional Summary    EKG Interpretation  Date/Time:  Saturday September 05 2021 00:49:48 EST Ventricular Rate:  60 PR Interval:  171 QRS Duration: 98 QT Interval:  468 QTC Calculation: 468 R  Axis:   -11 Text Interpretation: Sinus rhythm Probable anterior infarct, age indeterminate No significant change was found Confirmed by Gerlene Fee [phone]) on 09/05/2021 1:48:08 AM       Labs Reviewed  CBC - Abnormal; Notable for the following components:      Result Value   Hemoglobin 17.2 (*)    All other components within normal limits  COMPREHENSIVE METABOLIC PANEL - Abnormal; Notable for the following components:   Sodium 133 (*)    CO2 21 (*)    Glucose, Bld 115 (*)    BUN 21 (*)    All other components within normal limits  PROTIME-INR  TROPONIN I (HIGH SENSITIVITY)  TROPONIN I (HIGH SENSITIVITY)    DG Chest Port 1 View  Final Result      Medications - No data to display   Procedures  /  Critical Care Procedures  ED Course and Medical Decision Making  Initial Impression and Ddx Chest pain, known CAD, LAD stent placed last week.  DDx includes ACS, stent occlusion, less likely PE as there is no evidence of DVT, currently with out any shortness of breath or tachycardia or hypoxia awaiting EKG and troponin x2.  Will discuss case with cardiology given the recent cath.  Past medical/surgical history that increases complexity of ED encounter: Recent stent placement  Interpretation of Diagnostics I personally reviewed the EKG and my interpretation is as follows: No ischemic changes    First troponin is 11 and second is flat upon repeat.  Chest x-ray suggesting a possible infiltrate.  Patient does report recent cough.  Oral temp 99.2 here.  Will cover with antibiotics.   Patient Reassessment and Ultimate Disposition/Management Case discussed with Dr. Alfred Levins and Dr. Burt Knack of the cardiology team, given the negative troponins patient is appropriate for discharge with close follow-up.  Patient management required discussion with the following services or consulting groups:  Cardiology  Complexity of Problems Addressed Acute complicated illness or Injury  Additional Data  Reviewed and Analyzed Further history obtained from: Recent Consult notes and Prior cath report/recent echo  Factors Impacting ED Encounter Risk Consideration of hospitalization  Barth Kirks. [place] Small, [place] [email or web address]  Final Clinical Impressions(s) / ED Diagnoses     ICD-10-CM   1. Near syncope  R55     2. Chest pain, unspecified type  R07.9       ED Discharge Orders          Ordered    amoxicillin (AMOXIL) 500 MG capsule  3 times daily        09/05/21 0537  azithromycin (ZITHROMAX) 250 MG tablet        09/05/21 5909             Discharge Instructions Discussed with and Provided to Patient:     Discharge Instructions      You were evaluated in the Emergency Department and after careful evaluation, we did not find any emergent condition requiring admission or further testing in the hospital.  Your exam/testing today was overall reassuring.  Recommend close follow-up with your cardiologist team to further discuss her symptoms.  If you take a nitro tablet, recommend staying in a laying or seated position for 20 to 30 minutes.  If you must move or get up, do so very slowly and be on the look out for symptoms of feeling faint.  We are treating you for a possible pneumonia found on your chest x-ray.  Take the antibiotics as directed.  Please return to the Emergency Department if you experience any worsening of your condition.  Thank you for allowing [place] to be a part of your care.        Maudie Flakes, MD 09/05/21 [phone]

## 2021-09-05 NOTE — ED Notes (Signed)
Patient verbalizes understanding of discharge instructions. Opportunity for questioning and answers were provided. Armband removed by staff, pt discharged from ED via wheelchair.  

## 2021-09-05 NOTE — ED Triage Notes (Signed)
Pt BIB EMS from home. Pt recently had an MI and had a stent placed - 99% blockage LAD - pt started having CP tonight that was comparable to MI pain - Took 1 nitro, got up and had a syncopal episode - when pt woke up CP had resolved. EMS reports pt initially pale but color improving.  BP 134/80 12 lead unremark with EMS  EMS gave 324 ASA  20LAC

## 2021-09-05 NOTE — Discharge Instructions (Signed)
You were evaluated in the Emergency Department and after careful evaluation, we did not find any emergent condition requiring admission or further testing in the hospital.  Your exam/testing today was overall reassuring.  Recommend close follow-up with your cardiologist team to further discuss her symptoms.  If you take a nitro tablet, recommend staying in a laying or seated position for 20 to 30 minutes.  If you must move or get up, do so very slowly and be on the look out for symptoms of feeling faint.  We are treating you for a possible pneumonia found on your chest x-ray.  Take the antibiotics as directed.  Please return to the Emergency Department if you experience any worsening of your condition.  Thank you for allowing [place] to be a part of your care.

## 2021-09-07 ENCOUNTER — Other Ambulatory Visit (HOSPITAL_BASED_OUTPATIENT_CLINIC_OR_DEPARTMENT_OTHER): Payer: Self-pay

## 2021-09-07 ENCOUNTER — Other Ambulatory Visit (HOSPITAL_COMMUNITY): Payer: Self-pay

## 2021-09-08 ENCOUNTER — Other Ambulatory Visit: Payer: 59

## 2021-09-08 ENCOUNTER — Other Ambulatory Visit (HOSPITAL_BASED_OUTPATIENT_CLINIC_OR_DEPARTMENT_OTHER): Payer: Self-pay

## 2021-09-10 ENCOUNTER — Encounter: Payer: Self-pay | Admitting: Physician Assistant

## 2021-09-10 ENCOUNTER — Ambulatory Visit (INDEPENDENT_AMBULATORY_CARE_PROVIDER_SITE_OTHER): Payer: No Typology Code available for payment source | Admitting: Physician Assistant

## 2021-09-10 ENCOUNTER — Ambulatory Visit: Payer: No Typology Code available for payment source | Admitting: Physician Assistant

## 2021-09-10 ENCOUNTER — Other Ambulatory Visit: Payer: Self-pay

## 2021-09-10 VITALS — BP 122/90 | HR 60 | Ht 72.0 in | Wt 197.8 lb

## 2021-09-10 DIAGNOSIS — Z789 Other specified health status: Secondary | ICD-10-CM

## 2021-09-10 DIAGNOSIS — I214 Non-ST elevation (NSTEMI) myocardial infarction: Secondary | ICD-10-CM

## 2021-09-10 DIAGNOSIS — M25561 Pain in right knee: Secondary | ICD-10-CM

## 2021-09-10 DIAGNOSIS — R400 Somnolence: Secondary | ICD-10-CM

## 2021-09-10 DIAGNOSIS — G4733 Obstructive sleep apnea (adult) (pediatric): Secondary | ICD-10-CM

## 2021-09-10 DIAGNOSIS — R0683 Snoring: Secondary | ICD-10-CM

## 2021-09-10 DIAGNOSIS — I2510 Atherosclerotic heart disease of native coronary artery without angina pectoris: Secondary | ICD-10-CM

## 2021-09-10 DIAGNOSIS — E785 Hyperlipidemia, unspecified: Secondary | ICD-10-CM

## 2021-09-10 NOTE — Patient Instructions (Signed)
Medication Instructions:  Your physician recommends that you continue on your current medications as directed. Please refer to the Current Medication list given to you today.   *If you need a refill on your cardiac medications before your next appointment, please call your pharmacy*   Lab Work: Your physician recommends that you return for lab work today  CK  If you have labs (blood work) drawn today and your tests are completely normal, you will receive your results only by: [place] (if you have MyChart) OR A paper copy in the mail If you have any lab test that is abnormal or we need to change your treatment, we will call you to review the results.   Testing/Procedures: NONE ordered at this time of appointment     Follow-Up: At [facility], you and your health needs are our priority.  As part of our continuing mission to provide you with exceptional heart care, we have created designated Provider Care Teams.  These Care Teams include your primary Cardiologist (physician) and Advanced Practice Providers (APPs -  Physician Assistants and Nurse Practitioners) who all work together to provide you with the care you need, when you need it.  We recommend signing up for the patient portal called "MyChart".  Sign up information is provided on this After Visit Summary.  MyChart is used to connect with patients for Virtual Visits (Telemedicine).  Patients are able to view lab/test results, encounter notes, upcoming appointments, etc.  Non-urgent messages can be sent to your provider as well.   To learn more about what you can do with MyChart, go to [email or web address].    Your next appointment:   3 month(s)  The format for your next appointment:   In Person  Provider:   Quay Burow, MD     Other Instructions None

## 2021-09-10 NOTE — Addendum Note (Signed)
Addended by: Jacqulynn Cadet on: 09/10/2021 02:02 PM   Modules accepted: Orders

## 2021-09-10 NOTE — Addendum Note (Signed)
Addended by: Jacqulynn Cadet on: 09/10/2021 01:48 PM   Modules accepted: Orders

## 2021-09-10 NOTE — Progress Notes (Signed)
Cardiology Office Note:    Date:  09/10/2021   ID:  Kevin Wallace, DOB [birth year], MRN [id number]  PCP:  Ginger Organ., MD   [facility] Providers Cardiologist:  Quay Burow, MD     Referring MD: Ginger Organ., MD   Chief Complaint  Patient presents with   Hospitalization Follow-up    Post cath  [facility]  History of Present Illness:    Kevin Wallace is a 61 y.o. male with a hx of hyperlipidemia presented to the ER on 08/30/2021 with 2 weeks of intermittent chest pain while playing golf.  Cardiac enzymes elevated and he was taken to the Cath Lab for definitive angiography.  Heart cath showed severe mid LAD stenosis of 99% successfully treated with DES.  Remaining nonobstructive disease treated medically.  Beta-blocker was not started due to baseline bradycardia.  He was started on aspirin and Brilinta x12 months.  Echocardiogram with preserved LVEF of 50 to 55%, normal RV function, trivial MR, mild calcification of the aortic valve without stenosis.  He was discharged on 09/01/2021.  He returned to the ER on 09/05/2021 for an episode of chest pain that he self treated at home with sublingual nitro x1.  He felt nauseous but collapsed on the couch with loss of consciousness.  His wife brought him back to the ER.  He ruled out with negative enzymes.  His description of events sound consistent with hypertension related to nitro use.  CXR with possible infiltrate and he was discharged with amoxicillin and Zithromax for possible pneumonia.  Of note he did not have a fever or leukocytosis.  He presents today for scheduled post-cath follow-up.  Cath site clean dry and intact.  He has had no further chest pain since 09/05/2021.  We have a long discussion regarding risk factor modification and his nonobstructive disease. He discussed shortly after starting 40 mg crestor he developed one day of right shoulder pain that improved and then developed right knee pain. Knee pain is worse with  movement and improved with tylenol. He questions statin myalgias. We discussed drawing a CK and trialing him off of crestor, starting 10 mg after a week of statin holiday and then increasing to 20 mg. In the meantime, I will draw a CK. He has a history of gout and states its similar to his gout pain in the past. His wife is also concerned about PAD.    Past Medical History:  Diagnosis Date   Allergy    CAD (coronary artery disease)    s/p DES to mLAD 08/31/21   Cancer ([place]) 11/2019   Prostate Cancer -recently dx -surgery scheduled 9/39/03   Complication of anesthesia    took a long time to wear off    Gout    High cholesterol    diet controlled, no meds    Past Surgical History:  Procedure Laterality Date   ANKLE ARTHROSCOPY     "had it cleaned out"   COLONOSCOPY  10/14/2014   KNEE ARTHROSCOPY  07/2019   KNEE ARTHROSCOPY  1980s   LEFT HEART CATH AND CORONARY ANGIOGRAPHY N/A 08/31/2021   Procedure: LEFT HEART CATH AND CORONARY ANGIOGRAPHY;  Surgeon: Burnell Blanks, MD;  Location: [facility];  Service: Cardiovascular;  Laterality: N/A;   LYMPHADENECTOMY Bilateral 02/28/2020   Procedure: LYMPHADENECTOMY, PELVIC;  Surgeon: Raynelle Bring, MD;  Location: [facility];  Service: Urology;  Laterality: Bilateral;   ROBOT ASSISTED LAPAROSCOPIC RADICAL PROSTATECTOMY N/A 02/28/2020   Procedure: XI  ROBOTIC ASSISTED LAPAROSCOPIC RADICAL PROSTATECTOMY LEVEL 2;  Surgeon: Raynelle Bring, MD;  Location: [facility];  Service: Urology;  Laterality: N/A;    Current Medications: Current Meds  Medication Sig   allopurinol (ZYLOPRIM) 300 MG tablet Take 300 mg by mouth daily.   amoxicillin (AMOXIL) 500 MG capsule Take 1 capsule (500 mg total) by mouth 3 (three) times daily for 7 days.   aspirin 81 MG EC tablet Take 1 tablet (81 mg total) by mouth daily. Swallow whole.   nitroGLYCERIN (NITROSTAT) 0.4 MG SL tablet Place 1 tablet (0.4 mg total) under the tongue every 5 (five) minutes x 3 doses as  needed for chest pain.   rosuvastatin (CRESTOR) 40 MG tablet Take 1 tablet (40 mg total) by mouth daily. (Patient taking differently: Take 40 mg by mouth at bedtime.)   ticagrelor (BRILINTA) 90 MG TABS tablet Take 1 tablet (90 mg total) by mouth 2 (two) times daily.     Allergies:   Colchicine   Social History   Socioeconomic History   Marital status: Married    Spouse name: Not on file   Number of children: 2   Years of education: Not on file   Highest education level: Not on file  Occupational History   Not on file  Tobacco Use   Smoking status: Never   Smokeless tobacco: Never  Vaping Use   Vaping Use: Never used  Substance and Sexual Activity   Alcohol use: No    Alcohol/week: 0.0 standard drinks   Drug use: No   Sexual activity: Yes    Birth control/protection: None  Other Topics Concern   Not on file  Social History Narrative   Lives with wife.     Social Determinants of Health   Financial Resource Strain: Not on file  Food Insecurity: Not on file  Transportation Needs: Not on file  Physical Activity: Not on file  Stress: Not on file  Social Connections: Not on file     Family History: The patient's family history includes Rheumatic fever (age of onset: 78) in his father. There is no history of Colon cancer, Rectal cancer, Stomach cancer, or Esophageal cancer.  ROS:   Please see the history of present illness.     All other systems reviewed and are negative.  EKGs/Labs/Other Studies Reviewed:    The following studies were reviewed today:  Echo 09/01/21:  1. Left ventricular ejection fraction, by estimation, is 50 to 55%. The  left ventricle has low normal function. The left ventricle demonstrates  regional wall motion abnormalities (see scoring diagram/findings for  description). Left ventricular diastolic   parameters were normal.   2. Right ventricular systolic function is normal. The right ventricular  size is normal. Tricuspid regurgitation signal  is inadequate for assessing  PA pressure.   3. The mitral valve is grossly normal. Trivial mitral valve  regurgitation. No evidence of mitral stenosis.   4. The aortic valve is tricuspid. There is mild calcification of the  aortic valve. Aortic valve regurgitation is not visualized. Aortic valve  sclerosis is present, with no evidence of aortic valve stenosis.   5. The inferior vena cava is normal in size with greater than 50%  respiratory variability, suggesting right atrial pressure of 3 mmHg.    Left heart cath 08/31/21:   Prox RCA lesion is 20% stenosed.   Mid RCA lesion is 20% stenosed.   Ost Cx to Prox Cx lesion is 30% stenosed.   1st Mrg lesion is  30% stenosed.   2nd Diag lesion is 50% stenosed.   Mid LAD lesion is 99% stenosed.   A drug-eluting stent was successfully placed using a SYNERGY XD [id number].   Post intervention, there is a 0% residual stenosis.   The left ventricular systolic function is normal.   LV end diastolic pressure is normal.   The left ventricular ejection fraction is 50-55% by visual estimate.   There is no mitral valve regurgitation.   Severe mid LAD stenosis just beyond a moderate caliber diagonal branch. The diagonal branch has moderate ostial stenosis.  Successful PTCA/DES x 1 mid LAD Mild proximal Circumflex stenosis Large dominant RCA with mild proximal and mild distal stenosis Preserved LV systolic function with hypokinesis of the apex.    Recommendations: Will continue DAPT with ASA and Brilinta for one year. Continue statin. No beta blocker currently secondary to bradycardia. Echo in the am.     EKG:  EKG is not ordered today.    Recent Labs: 09/05/2021: ALT 43; BUN 21; Creatinine, Ser 1.16; Hemoglobin 17.2; Platelets 354; Potassium 3.8; Sodium 133  Recent Lipid Panel    Component Value Date/Time   CHOL 117 09/01/2021 0404   TRIG 85 09/01/2021 0404   HDL 35 (L) 09/01/2021 0404   CHOLHDL 3.3 09/01/2021 0404   VLDL 17 09/01/2021 0404    LDLCALC 65 09/01/2021 0404     Risk Assessment/Calculations:           Physical Exam:    VS:  BP 122/90    Pulse 60    Ht 6' (1.829 m)    Wt 197 lb 12.8 oz (89.7 kg)    SpO2 97%    BMI 26.83 kg/m     Wt Readings from Last 3 Encounters:  09/10/21 197 lb 12.8 oz (89.7 kg)  09/05/21 195 lb (88.5 kg)  08/31/21 199 lb 3.2 oz (90.4 kg)     GEN:  Well nourished, well developed in no acute distress HEENT: Normal NECK: No JVD; No carotid bruits LYMPHATICS: No lymphadenopathy CARDIAC: RRR, no murmurs, rubs, gallops RESPIRATORY:  Clear to auscultation without rales, wheezing or rhonchi  ABDOMEN: Soft, non-tender, non-distended MUSCULOSKELETAL:  No edema; No deformity  SKIN: Warm and dry NEUROLOGIC:  Alert and oriented x 3 PSYCHIATRIC:  Normal affect   ASSESSMENT:    1. NSTEMI (non-ST elevated myocardial infarction) (HCC)   2. Statin intolerance   3. Right knee pain, unspecified chronicity   4. Snoring   5. Obstructive sleep apnea syndrome   6. Daytime somnolence   7. Coronary artery disease involving native coronary artery of native heart without angina pectoris   8. Dyslipidemia, goal LDL below 70    PLAN:    In order of problems listed above:  CAD s/p DES to LAD in the setting of NSTEMI - continue ASA and brilinta - no BB given baseline bradycardia - no further chest pain - recommended cardiac rehab - he is walking at home without issues   Dyslipidemia 09/01/2021: Cholesterol 117; HDL 35; LDL Cholesterol 65; Triglycerides 85; VLDL 17 - LDL was 65 on admission on 10 mg crestor - he was titrated to 40 mg crestor - question statin associated myalgias - discussed how to take a statin holiday and restart lower dose and titrate to 20 mg - he prefers to wait and see what happens with his knee  - will also draw a CK   Snoring Daytime sleepiness - will order home sleep study  Follow up in  6 months with Dr. Gwenlyn Found.      Cardiac Rehabilitation Eligibility  Assessment  The patient is ready to start cardiac rehabilitation from a cardiac standpoint.        Medication Adjustments/Labs and Tests Ordered: Current medicines are reviewed at length with the patient today.  Concerns regarding medicines are outlined above.  Orders Placed This Encounter  Procedures   CK   Home sleep test   No orders of the defined types were placed in this encounter.   Patient Instructions  Medication Instructions:  Your physician recommends that you continue on your current medications as directed. Please refer to the Current Medication list given to you today.   *If you need a refill on your cardiac medications before your next appointment, please call your pharmacy*   Lab Work: Your physician recommends that you return for lab work today  CK  If you have labs (blood work) drawn today and your tests are completely normal, you will receive your results only by: [place] (if you have MyChart) OR A paper copy in the mail If you have any lab test that is abnormal or we need to change your treatment, we will call you to review the results.   Testing/Procedures: NONE ordered at this time of appointment     Follow-Up: At [facility], you and your health needs are our priority.  As part of our continuing mission to provide you with exceptional heart care, we have created designated Provider Care Teams.  These Care Teams include your primary Cardiologist (physician) and Advanced Practice Providers (APPs -  Physician Assistants and Nurse Practitioners) who all work together to provide you with the care you need, when you need it.  We recommend signing up for the patient portal called "MyChart".  Sign up information is provided on this After Visit Summary.  MyChart is used to connect with patients for Virtual Visits (Telemedicine).  Patients are able to view lab/test results, encounter notes, upcoming appointments, etc.  Non-urgent messages can be sent to  your provider as well.   To learn more about what you can do with MyChart, go to [email or web address].    Your next appointment:   3 month(s)  The format for your next appointment:   In Person  Provider:   Quay Burow, MD     Other Instructions None    Signed, Ledora Bottcher, [place]  09/10/2021 1:31 PM     [facility]

## 2021-09-11 LAB — CK: Total CK: 60 U/L (ref 41–331)

## 2021-09-12 ENCOUNTER — Telehealth: Payer: Self-pay | Admitting: Physician Assistant

## 2021-09-12 NOTE — Telephone Encounter (Signed)
Patient paged after hour answering service complaining of a bruising in the tricep area of the right arm.  The size of the bruising is roughly 3 inches wide.  It is nontender and soft to the touch.  I recommended continue observation.  Chance of significant vascular complication more than 12 days after the cardiac catheterization is very low.  I suspect occurrence of the bruising is related to the aspirin to Brilinta.  He is aware to contact cardiology service again if the area is significantly swollen or he began to feel pain at the site.  Last PCI and DES placed on 08/31/2021.  Seen 2 days ago by Fabian Sharp PA-C at which time he was doing well.  There was some concern of statin intolerance.  Total CK was normal.  I discussed the total CK result with the patient.

## 2021-09-13 ENCOUNTER — Telehealth: Payer: Self-pay | Admitting: Student in an Organized Health Care Education/Training Program

## 2021-09-13 NOTE — Telephone Encounter (Signed)
Returned patient call from Mining engineer. Patient with low grade fever, nausea, emesis x2, diarrhea. No recent sick contacts. Able to tolerate medications. No missed DAPT. Recent PCI on ASA/ticag. Not similar to anginal equivalent of chest pain. Explained that cannot completely rule out ACS over phone but seemed more consistent with gastroenteritis. If unable to tolerate PO intake then needs to be evaluated and/or if sx worsen or fail to improve. Explained how he must be able to tolerate DAPT with recent PCI. He and wife understands. Otherwise stable. Will call 911 if similar anginal equivalents occur like 2 weeks ago. Will go to ED if sx worsen/fail to improve overnight.

## 2021-09-14 ENCOUNTER — Telehealth: Payer: Self-pay | Admitting: Student in an Organized Health Care Education/Training Program

## 2021-09-14 ENCOUNTER — Telehealth: Payer: Self-pay | Admitting: *Deleted

## 2021-09-14 NOTE — Telephone Encounter (Signed)
Also asked them to covid test given sx. They don't have one at home but will get one in morning if sx persist.

## 2021-09-14 NOTE — Telephone Encounter (Signed)
-----   Message from Cleon Gustin, [place] sent at 09/10/2021  5:10 PM EST ----- Regarding: [place] ordered an Itamar Sleep Study for this patient. I let her know that we have not went live yet to order from [facility] but will take care of this patient.  Mariann Laster- Once you complete pre-cert if needed, can you let Arbie Cookey know so she can reach out to the patient for him to come in to [facility] to receive the device and education?  Arbie Cookey- When the patient comes in to get the device, can you mark it on the log sheet as being ordered by [facility]?  Thanks, [place]

## 2021-09-14 NOTE — Telephone Encounter (Signed)
Per [place]: patient was given a itamar. no PA is required  I will call the pt and set up a time for him to come by the [facility] office so that I may set him up with the Itamar Sleep study.   I have s/w the pt and his wife. Set up 09/16/21 2 pm to come to the [place] location for Itamar device set up. [address] address has been given to both the wife and pt. Both are aware that when they come in they will stop by the greeter desk and let the greeter know he is here to see Arbie Cookey for Home sleep study.    Need Stop Bang score done

## 2021-09-15 ENCOUNTER — Encounter: Payer: Self-pay | Admitting: Cardiovascular Disease

## 2021-09-15 NOTE — Telephone Encounter (Addendum)
Spoke with patient and informed him to continue taking brilinta and aspirin. He repeated this instruction to me in his own words. He also had a question about a cardiac rehab appointment. I gave him Durene Cal name so he can check with her regarding that.

## 2021-09-15 NOTE — Telephone Encounter (Signed)
Spoke with patient who had a stent placement 2 weeks ago. He reports a nosebleed from rt nostril today that soaked 2-3 tissues. He then held pressure for 10 minutes X 2 and the bleeding stopped. While on the phone, he removed a tissue and had no bleeding. He wants to know if he should stop brilinta or aspirin for a few days. Please advise.

## 2021-09-16 ENCOUNTER — Telehealth (HOSPITAL_COMMUNITY): Payer: Self-pay

## 2021-09-16 NOTE — Telephone Encounter (Signed)
Patient Name:         DOB:       Height:     Weight:  Office Name:         Referring Provider:  Today's Date:  Date:   STOP BANG RISK ASSESSMENT S (snore) Have you been told that you snore?     YES   T (tired) Are you often tired, fatigued, or sleepy during the day?   YES  O (obstruction) Do you stop breathing, choke, or gasp during sleep? NO   P (pressure) Do you have or are you being treated for high blood pressure? NO   B (BMI) Is your body index greater than 35 kg/m? NO   A (age) Are you 61 years old or older? YES  N (neck) Do you have a neck circumference greater than 16 inches?   YES   G (gender) Are you a male? YES   TOTAL STOP/BANG YES ANSWERS 5                                                                       For Office Use Only              Procedure Order Form    YES to 3+ Stop Bang questions OR two clinical symptoms - patient qualifies for WatchPAT (CPT 62952)             Clinical Notes: Will consult Sleep Specialist and refer for management of therapy due to patient increased risk of Sleep Apnea. Ordering a sleep study due to the following two clinical symptoms: Excessive daytime sleepiness G47.10 / Gastroesophageal reflux K21.9 / Nocturia R35.1 / Morning Headaches G44.221 / Difficulty concentrating R41.840 / Memory problems or poor judgment G31.84 / Personality changes or irritability R45.4 / Loud snoring R06.83 / Depression F32.9 / Unrefreshed by sleep G47.8 / Impotence N52.9 / History of high blood pressure R03.0 / Insomnia G47.00    I understand that I am proceeding with a home sleep apnea test as ordered by my treating physician. I understand that untreated sleep apnea is a serious cardiovascular risk factor and it is my responsibility to perform the test and seek management for sleep apnea. I will be contacted with the results and be managed for sleep apnea by a local sleep physician. I will be receiving equipment and further instructions from [facility]. I shall promptly ship back the equipment via the included mailing label. I understand my insurance will be billed for the test and as the patient I am responsible for any insurance related out-of-pocket costs incurred. I have been provided with written instructions and can call for additional video or telephonic instruction, with 24-hour availability of qualified personnel to answer any questions: Patient Help Desk [phone].  Patient Signature ______________________________________________________   Date______________________ Patient Telemedicine Verbal Consent

## 2021-09-16 NOTE — Telephone Encounter (Signed)
Returned pt's phone call and advised pt that we have his cardiac rehab referral and that he has been cleared to participate and that he has about 30 pt's in front of him before we can schedule. I advised pt that we will contact him hopefully sooner than later to get him scheduled. Pt stated to keep him on the waiting list.

## 2021-09-16 NOTE — Telephone Encounter (Signed)
Pt came in today and has been set up for an Itamar Sleep Study.  Doreene Adas, PAC saw the pt at the [facility] office 09/10/21 and ordered a sleep study.   Called and made the patient aware that he may proceed with the [facility] Sleep Study. PIN # provided to the patient. Patient made aware that he will be contacted after the test has been read with the results and any recommendations. Patient verbalized understanding and thanked me for the call.   Pt will do sleep study tomorrow night. Pt thanked me for the help today.

## 2021-09-17 ENCOUNTER — Encounter (INDEPENDENT_AMBULATORY_CARE_PROVIDER_SITE_OTHER): Payer: No Typology Code available for payment source | Admitting: Cardiology

## 2021-09-17 DIAGNOSIS — G4733 Obstructive sleep apnea (adult) (pediatric): Secondary | ICD-10-CM

## 2021-09-20 NOTE — Procedures (Signed)
° °  Sleep Study Report  Patient Information Study Date: 09/17/21 Patient Name: Kevin Wallace Patient ID: [id number] Birth Date: March 31, 2061 Age: 61 Gender: Male BMI: 26.6 (W=196 lb, H=6' 0'') Neck Circ.: 74 '' Referring Physician: Doreene Adas, PA  TEST DESCRIPTION: Home sleep apnea testing was completed using the WatchPat, a Type 1 device, utilizing peripheral arterial tonometry (PAT), chest movement, actigraphy, pulse oximetry, pulse rate, body position and snore. AHI was calculated with apnea and hypopnea using valid sleep time as the denominator. RDI includes apneas, hypopneas, and RERAs. The data acquired and the scoring of sleep and all associated events were performed in accordance with the recommended standards and specifications as outlined in the AASM Manual for the Scoring of Sleep and Associated Events 2.2.0 (2015).  FINDINGS: 1. Mild Obstructive Sleep Apnea with AHI 11.6/hr. 2. No Central Sleep Apnea with pAHIc 1.8/hr. 3. Oxygen desaturations as low as 81%. 4. Minimal snoring was present. O2 sats were < 88% for 0.3 min. 5. Total sleep time was 5 hrs and 37 min. 6. 17.6% of total sleep time was spent in REM sleep. 7. Normal sleep onset latency at 16 min. 8. Shortened REM sleep onset latency at 55 min. 9. Total awakenings were 12.  DIAGNOSIS: Mild Obstructive Sleep Apnea (G47.33)  RECOMMENDATIONS: 1. Clinical correlation of these findings is necessary. The decision to treat obstructive sleep apnea (OSA) is usually based on the presence of apnea symptoms or the presence of associated medical conditions such as Hypertension, Congestive Heart Failure, Atrial Fibrillation or Obesity. The most common symptoms of OSA are snoring, gasping for breath while sleeping, daytime sleepiness and fatigue.  2. Initiating apnea therapy is recommended given the presence of symptoms and/or associated conditions. Recommend proceeding with one of the following:   a. Auto-CPAP therapy with  a pressure range of 5-20cm H2O.   b. An oral appliance (OA) that can be obtained from certain dentists with expertise in sleep medicine. These are primarily of use in non-obese patients with mild and moderate disease.   c. An ENT consultation which may be useful to look for specific causes of obstruction and possible treatment options.   d. If patient is intolerant to PAP therapy, consider referral to ENT for evaluation for hypoglossal nerve stimulator.  3. Close follow-up is necessary to ensure success with CPAP or oral appliance therapy for maximum benefit .  4. A follow-up oximetry study on CPAP is recommended to assess the adequacy of therapy and determine the need for supplemental oxygen or the potential need for Bi-level therapy. An arterial blood gas to determine the adequacy of baseline ventilation and oxygenation should also be considered.  5. Healthy sleep recommendations include: adequate nightly sleep (normal 7-9 hrs/night), avoidance of caffeine after noon and alcohol near bedtime, and maintaining a sleep environment that is cool, dark and quiet.  6. Weight loss for overweight patients is recommended. Even modest amounts of weight loss can significantly improve the severity of sleep apnea.  7. Snoring recommendations include: weight loss where appropriate, side sleeping, and avoidance of alcohol before bed.  8. Operation of motor vehicle should be avoided when sleepy.  Signature: Electronically Signed: 09/20/21 Fransico Him, MD; [facility]; [place], American Board of Sleep Medicine

## 2021-09-22 ENCOUNTER — Ambulatory Visit: Payer: No Typology Code available for payment source

## 2021-09-22 DIAGNOSIS — R400 Somnolence: Secondary | ICD-10-CM

## 2021-09-22 DIAGNOSIS — G4733 Obstructive sleep apnea (adult) (pediatric): Secondary | ICD-10-CM

## 2021-09-22 DIAGNOSIS — R0683 Snoring: Secondary | ICD-10-CM

## 2021-09-23 ENCOUNTER — Telehealth: Payer: Self-pay | Admitting: *Deleted

## 2021-09-23 NOTE — Telephone Encounter (Signed)
-----   Message from Sueanne Margarita, MD sent at 09/20/2021  6:07 PM EST ----- Please let patient know that they have sleep apnea and recommend treating with CPAP.  Please order an auto CPAP from 4-15cm H2O with heated humidity and mask of choice.  Order overnight pulse ox on CPAP.  Followup with me in 6 weeks.

## 2021-09-23 NOTE — Telephone Encounter (Signed)
Left message to return a call to discuss sleep study results and recommendations. 

## 2021-09-28 ENCOUNTER — Encounter: Payer: Self-pay | Admitting: Cardiovascular Disease

## 2021-09-30 NOTE — Telephone Encounter (Signed)
Patient returned a call and was given sleep study results and recommendations. After all questions were answered to his satisfaction he agrees to proceed with getting CPAP. Order has been sent to [facility].

## 2021-10-01 ENCOUNTER — Encounter (HOSPITAL_COMMUNITY): Payer: Self-pay

## 2021-10-01 NOTE — Telephone Encounter (Signed)
Pt returned CR phone call and stated he is interested in CR. Patient will come in for orientation on 10/27/21 @ 830AM and will attend the 3PM exercise class.   Tourist information centre manager.

## 2021-10-01 NOTE — Telephone Encounter (Signed)
Attempted to call patient in regards to Cardiac Rehab - LM on VM Mailed letter 

## 2021-10-12 ENCOUNTER — Encounter: Payer: Self-pay | Admitting: Cardiovascular Disease

## 2021-10-12 DIAGNOSIS — I214 Non-ST elevation (NSTEMI) myocardial infarction: Secondary | ICD-10-CM

## 2021-10-12 NOTE — Telephone Encounter (Signed)
Spoke with pt, he reports seeing dark colored blood in his stool yesterday and today there is more. He reports some bright blood when he wipes. He also reports bruising easily. Otherwise, he feels fine. He has been doing his walking and denies any abdominal pain. He is having regular bowel movements. His blood pressure is [id number]. He was placed on brilinta after his procedure and his rosuvastatin was increased, no other new medication changes. He denies any bleeding in his urine. He has also called his PCP but they have not called him back. Aware dr berry is working in the hospital today but will forward for his review.

## 2021-10-13 LAB — CBC
Hematocrit: 49.6 % (ref 37.5–51.0)
Hemoglobin: 17 g/dL (ref 13.0–17.7)
MCH: 30.2 pg (ref 26.6–33.0)
MCHC: 34.3 g/dL (ref 31.5–35.7)
MCV: 88 fL (ref 79–97)
Platelets: 391 10*3/uL (ref 150–450)
RBC: 5.63 x10E6/uL (ref 4.14–5.80)
RDW: 12.5 % (ref 11.6–15.4)
WBC: 7.1 10*3/uL (ref 3.4–10.8)

## 2021-10-13 NOTE — Telephone Encounter (Signed)
Spoke with pt regarding issues with blood in stool. Pt states that he received a call yesterday and was given an appointment to see one of our APPs on Thurs (3/2). Instructed pt that we would also like to get blood work prior to this appointment. Confirmed pt appt and pt verbalizes understanding. He plans to have labs drawn later today at our office.

## 2021-10-14 NOTE — Progress Notes (Signed)
Cardiology Office Note:    Date:  10/15/2021   ID:  Kevin Wallace, DOB 1961/05/15, MRN [id number]  PCP:  Ginger Organ., MD [place] Cardiologist: Quay Burow, MD   Reason for visit: Follow-up bloody stools  History of Present Illness:    Kevin Wallace is a 61 y.o. male with a hx of hyperlipidemia presented to the ER on 08/30/2021 with 2 weeks of intermittent chest pain while playing golf.  +NSTEMI. Cath showed severe mid LAD stenosis just beyond a moderate caliber diagonal branch. The diagonal branch has moderate ostial stenosis. S/p Successful PTCA/DES x 1 mid LAD. Otherwise mild non obstructive CAD. There was mild hypokinesis at [place]. Echo showed low normal LVEF at 50-55% with regional WM abnormality.  Discharged on Brilinta and aspirin.  He returned to the ER on 09/05/2021 for an episode of chest pain that he self treated at home with sublingual nitro x1.  He felt nauseous but collapsed on the couch with loss of consciousness.  He ruled out with negative enzymes.  His description of events sound consistent with hypotension related to nitro use.  He saw Fabian Sharp in January 2023 and was doing better with no chest pain.  There is question of statin associated myalgias.  Levada Dy recommended a statin holiday and restarting Crestor 20 mg daily.  Home sleep study was ordered and showed mild obstructive sleep apnea.  Minimal snoring was present. O2 sats were < 88% for 0.3 min.  Since then, he has called with a nosebleed x 1 (able to stop himself) and bloody stools x 1.  He also mentions easy bruising.  He has been taking Brilinta 90 mg twice a day and aspirin 81.  He denies NSAID use.  He denies a history of reflux/GI ulcers.  He denies hemorrhoids.  We followed up on his myalgias.  He says occasionally he gets elbow pain that started when his Crestor was increased from 10 mg to 40 mg daily.  He has not tried a statin holiday or decreasing the dose.    Otherwise, he denies  chest pain and shortness of breath.  He states he initially sometimes gets lightheadedness if he gets up quickly.  He is concerned about having an MI at his age.  He does state his dad died from heart complications at age 77 but had a history of rheumatic heart disease.  He is back playing golf without symptoms.  He is scheduled to start cardiac rehab in 2 weeks.  He is trying to eat healthier at home.      Past Medical History:  Diagnosis Date   Allergy    CAD (coronary artery disease)    s/p DES to mLAD 08/31/21   Cancer ([place]) 11/2019   Prostate Cancer -recently dx -surgery scheduled 6/71/24   Complication of anesthesia    took a long time to wear off    Gout    High cholesterol    diet controlled, no meds    Past Surgical History:  Procedure Laterality Date   ANKLE ARTHROSCOPY     "had it cleaned out"   COLONOSCOPY  10/14/2014   KNEE ARTHROSCOPY  07/2019   KNEE ARTHROSCOPY  1980s   LEFT HEART CATH AND CORONARY ANGIOGRAPHY N/A 08/31/2021   Procedure: LEFT HEART CATH AND CORONARY ANGIOGRAPHY;  Surgeon: Burnell Blanks, MD;  Location: [facility];  Service: Cardiovascular;  Laterality: N/A;   LYMPHADENECTOMY Bilateral 02/28/2020   Procedure: LYMPHADENECTOMY, PELVIC;  Surgeon: Alinda Money,  Wynetta Emery, MD;  Location: [facility];  Service: Urology;  Laterality: Bilateral;   ROBOT ASSISTED LAPAROSCOPIC RADICAL PROSTATECTOMY N/A 02/28/2020   Procedure: XI ROBOTIC ASSISTED LAPAROSCOPIC RADICAL PROSTATECTOMY LEVEL 2;  Surgeon: Raynelle Bring, MD;  Location: [facility];  Service: Urology;  Laterality: N/A;    Current Medications: Current Meds  Medication Sig   allopurinol (ZYLOPRIM) 300 MG tablet Take 300 mg by mouth daily.   aspirin 81 MG EC tablet Take 1 tablet (81 mg total) by mouth daily. Swallow whole.   nitroGLYCERIN (NITROSTAT) 0.4 MG SL tablet Place 1 tablet (0.4 mg total) under the tongue every 5 (five) minutes x 3 doses as needed for chest pain.   rosuvastatin (CRESTOR) 40 MG  tablet Take 1 tablet (40 mg total) by mouth daily. (Patient taking differently: Take 40 mg by mouth at bedtime.)   ticagrelor (BRILINTA) 90 MG TABS tablet Take 1 tablet (90 mg total) by mouth 2 (two) times daily.     Allergies:   Colchicine   Social History   Socioeconomic History   Marital status: Married    Spouse name: Not on file   Number of children: 2   Years of education: Not on file   Highest education level: Not on file  Occupational History   Not on file  Tobacco Use   Smoking status: Never   Smokeless tobacco: Never  Vaping Use   Vaping Use: Never used  Substance and Sexual Activity   Alcohol use: No    Alcohol/week: 0.0 standard drinks   Drug use: No   Sexual activity: Yes    Birth control/protection: None  Other Topics Concern   Not on file  Social History Narrative   Lives with wife.     Social Determinants of Health   Financial Resource Strain: Not on file  Food Insecurity: Not on file  Transportation Needs: Not on file  Physical Activity: Not on file  Stress: Not on file  Social Connections: Not on file     Family History: The patient's family history includes Rheumatic fever (age of onset: 63) in his father. There is no history of Colon cancer, Rectal cancer, Stomach cancer, or Esophageal cancer.  ROS:   Please see the history of present illness.     EKGs/Labs/Other Studies Reviewed:    EKG:  The ekg ordered today demonstrates marked sinus bradycardia with a heart rate for 48, inverted T waves in V4 to V6, no change.    Recent Labs: 09/05/2021: ALT 43; BUN 21; Creatinine, Ser 1.16; Potassium 3.8; Sodium 133 10/13/2021: Hemoglobin 17.0; Platelets 391   Recent Lipid Panel Lab Results  Component Value Date/Time   CHOL 117 09/01/2021 04:04 AM   TRIG 85 09/01/2021 04:04 AM   HDL 35 (L) 09/01/2021 04:04 AM   LDLCALC 65 09/01/2021 04:04 AM    Physical Exam:    VS:  BP 126/78    Pulse (!) 48    Ht 6' (1.829 m)    Wt 193 lb 12.8 oz (87.9 kg)     SpO2 96%    BMI 26.28 kg/m    No data found.  Wt Readings from Last 3 Encounters:  10/15/21 193 lb 12.8 oz (87.9 kg)  09/10/21 197 lb 12.8 oz (89.7 kg)  09/05/21 195 lb (88.5 kg)     GEN:  Well nourished, well developed in no acute distress HEENT: Normal NECK: No JVD; No carotid bruits CARDIAC: RRR, no murmurs, rubs, gallops RESPIRATORY:  Clear to auscultation without rales,  wheezing or rhonchi  ABDOMEN: Soft, non-tender, non-distended MUSCULOSKELETAL: No edema; No deformity  SKIN: Warm and dry NEUROLOGIC:  Alert and oriented PSYCHIATRIC:  Normal affect     ASSESSMENT AND PLAN   CAD without angina -NSTEMI 08/2021 with DES placed to LAD, otherwise mild nonobstructive CAD; EF 50 to 55% -Recommend cardiac rehab  -We discussed a more plant-based/vegetarian diet. -No beta-blocker given baseline bradycardia -For now continue Brilinta and aspirin.  Increase water and fiber intake.  If further bloody stools, recommend switching to Plavix with a 300 mg load and considering GI referral.  Mild sleep apnea -Healthy sleep recommendations include: adequate nightly sleep (normal 7-9 hrs/night), avoidance of caffeine afternoon and alcohol near bedtime, and maintaining a sleep environment that is cool, dark and quiet. -Snoring recommendations include: weight loss where appropriate, side sleeping, and avoidance of alcohol before bed.  Hyperlipidemia with goal LDL less than 55 -Lipids 09/01/2021: Cholesterol 117; HDL 35; LDL Cholesterol 65; Triglycerides 85.  Crestor increased from 10 to 40 mg daily. -Consider rechecking lipids at follow-up in April. -If statin myalgias are bothersome, consider referral to lipid pharmacist for PCSK9 inhibitor.  He needs strict LDL control given MI event with LDL of 65.  Disposition - Follow-up in April with Dr. Gwenlyn Found as scheduled.   Medication Adjustments/Labs and Tests Ordered: Current medicines are reviewed at length with the patient today.  Concerns  regarding medicines are outlined above.  Orders Placed This Encounter  Procedures   EKG 12-Lead   No orders of the defined types were placed in this encounter.   Patient Instructions  Medication Instructions:  No Changes *If you need a refill on your cardiac medications before your next appointment, please call your pharmacy*   Lab Work: No Labs If you have labs (blood work) drawn today and your tests are completely normal, you will receive your results only by: [place] (if you have MyChart) OR A paper copy in the mail If you have any lab test that is abnormal or we need to change your treatment, we will call you to review the results.   Testing/Procedures: No Testing   Follow-Up: At [facility], you and your health needs are our priority.  As part of our continuing mission to provide you with exceptional heart care, we have created designated Provider Care Teams.  These Care Teams include your primary Cardiologist (physician) and Advanced Practice Providers (APPs -  Physician Assistants and Nurse Practitioners) who all work together to provide you with the care you need, when you need it.  We recommend signing up for the patient portal called "MyChart".  Sign up information is provided on this After Visit Summary.  MyChart is used to connect with patients for Virtual Visits (Telemedicine).  Patients are able to view lab/test results, encounter notes, upcoming appointments, etc.  Non-urgent messages can be sent to your provider as well.   To learn more about what you can do with MyChart, go to [email or web address].    Your next appointment:   As Scheduled  The format for your next appointment:   In Person  Provider:   Quay Burow, MD     Other Instructions Increase water and fiber intake. Call office if increased bloody stool to change from Brilinta  to Plavix. If become increased call office to discuss Lipids.   Signed, Warren Lacy, PA-C   10/15/2021 9:30 AM    [facility]

## 2021-10-15 ENCOUNTER — Ambulatory Visit (INDEPENDENT_AMBULATORY_CARE_PROVIDER_SITE_OTHER): Payer: No Typology Code available for payment source | Admitting: Physician Assistant

## 2021-10-15 ENCOUNTER — Other Ambulatory Visit: Payer: Self-pay

## 2021-10-15 ENCOUNTER — Encounter: Payer: Self-pay | Admitting: Physician Assistant

## 2021-10-15 VITALS — BP 126/78 | HR 48 | Ht 72.0 in | Wt 193.8 lb

## 2021-10-15 DIAGNOSIS — E785 Hyperlipidemia, unspecified: Secondary | ICD-10-CM

## 2021-10-15 DIAGNOSIS — I2510 Atherosclerotic heart disease of native coronary artery without angina pectoris: Secondary | ICD-10-CM

## 2021-10-15 DIAGNOSIS — G4733 Obstructive sleep apnea (adult) (pediatric): Secondary | ICD-10-CM

## 2021-10-15 DIAGNOSIS — K921 Melena: Secondary | ICD-10-CM | POA: Diagnosis not present

## 2021-10-15 DIAGNOSIS — I214 Non-ST elevation (NSTEMI) myocardial infarction: Secondary | ICD-10-CM

## 2021-10-15 NOTE — Patient Instructions (Addendum)
Medication Instructions:  ?No Changes ?*If you need a refill on your cardiac medications before your next appointment, please call your pharmacy* ? ? ?Lab Work: ?No Labs ?If you have labs (blood work) drawn today and your tests are completely normal, you will receive your results only by: ?MyChart Message (if you have MyChart) OR ?A paper copy in the mail ?If you have any lab test that is abnormal or we need to change your treatment, we will call you to review the results. ? ? ?Testing/Procedures: ?No Testing ? ? ?Follow-Up: ?At [facility], you and your health needs are our priority.  As part of our continuing mission to provide you with exceptional heart care, we have created designated Provider Care Teams.  These Care Teams include your primary Cardiologist (physician) and Advanced Practice Providers (APPs -  Physician Assistants and Nurse Practitioners) who all work together to provide you with the care you need, when you need it. ? ?We recommend signing up for the patient portal called "MyChart".  Sign up information is provided on this After Visit Summary.  MyChart is used to connect with patients for Virtual Visits (Telemedicine).  Patients are able to view lab/test results, encounter notes, upcoming appointments, etc.  Non-urgent messages can be sent to your provider as well.   ?To learn more about what you can do with MyChart, go to [email or web address].   ? ?Your next appointment:   ?As Scheduled ? ?The format for your next appointment:   ?In Person ? ?Provider:   ?Quay Burow, MD   ? ? ?Other Instructions ?Increase water and fiber intake. Call office if increased bloody stool to change from Brilinta  to Plavix. If become increased call office to discuss Lipids. ?

## 2021-10-23 ENCOUNTER — Telehealth (HOSPITAL_COMMUNITY): Payer: Self-pay | Admitting: *Deleted

## 2021-10-23 NOTE — Telephone Encounter (Signed)
Confirmed appointment for orientation. Completed health history.Barnet Pall, RN,BSN ?10/23/2021 3:01 PM  ?

## 2021-10-23 NOTE — Telephone Encounter (Signed)
Left message to call cardiac rehab regarding cardiac rehab orientation.Barnet Pall, RN,BSN ?10/23/2021 2:24 PM  ?

## 2021-10-27 ENCOUNTER — Other Ambulatory Visit: Payer: Self-pay

## 2021-10-27 ENCOUNTER — Encounter (HOSPITAL_COMMUNITY)
Admission: RE | Admit: 2021-10-27 | Discharge: 2021-10-27 | Disposition: A | Discharge status: Home / Self Care | Payer: No Typology Code available for payment source | Source: Ambulatory Visit | Attending: Cardiovascular Disease | Admitting: Cardiovascular Disease

## 2021-10-27 ENCOUNTER — Encounter: Payer: Self-pay | Admitting: Cardiovascular Disease

## 2021-10-27 ENCOUNTER — Encounter (HOSPITAL_COMMUNITY): Payer: Self-pay

## 2021-10-27 VITALS — BP 122/70 | HR 60 | Ht 71.25 in | Wt 194.0 lb

## 2021-10-27 DIAGNOSIS — Z955 Presence of coronary angioplasty implant and graft: Secondary | ICD-10-CM | POA: Insufficient documentation

## 2021-10-27 DIAGNOSIS — I214 Non-ST elevation (NSTEMI) myocardial infarction: Secondary | ICD-10-CM | POA: Insufficient documentation

## 2021-10-27 NOTE — Progress Notes (Signed)
Cardiac Individual Treatment Plan ? ?Patient Details  ?Name: Kevin Wallace ?MRN: [id number] ?Date of Birth: [birth year] ?Referring Provider:   ?Flowsheet Row CARDIAC REHAB PHASE II ORIENTATION from 10/27/2021 in [place]  ?Referring Provider Lorretta Harp, MD  ? ?  ? ? ?Initial Encounter Date:  ?Flowsheet Row CARDIAC REHAB PHASE II ORIENTATION from 10/27/2021 in [place]  ?Date 10/27/21  ? ?  ? ? ?Visit Diagnosis: 08/30/21 NSTEMI ? ?08/31/21 DES LAD ? ?Patient's Home Medications on Admission: ? ?Current Outpatient Medications:  ?  allopurinol (ZYLOPRIM) 300 MG tablet, Take 300 mg by mouth daily., Disp: , Rfl:  ?  aspirin 81 MG EC tablet, Take 1 tablet (81 mg total) by mouth daily. Swallow whole., Disp: 90 tablet, Rfl: 3 ?  nitroGLYCERIN (NITROSTAT) 0.4 MG SL tablet, Place 1 tablet (0.4 mg total) under the tongue every 5 (five) minutes x 3 doses as needed for chest pain., Disp: 25 tablet, Rfl: 12 ?  rosuvastatin (CRESTOR) 40 MG tablet, Take 1 tablet (40 mg total) by mouth daily. (Patient taking differently: Take 40 mg by mouth at bedtime.), Disp: 90 tablet, Rfl: 3 ?  ticagrelor (BRILINTA) 90 MG TABS tablet, Take 1 tablet (90 mg total) by mouth 2 (two) times daily., Disp: 180 tablet, Rfl: 3 ? ?Past Medical History: ?Past Medical History:  ?Diagnosis Date  ? Allergy   ? CAD (coronary artery disease)   ? s/p DES to mLAD 08/31/21  ? Cancer ([facility]) 11/2019  ? Prostate Cancer -recently dx -surgery scheduled 02/28/20  ? Complication of anesthesia   ? took a long time to wear off   ? Gout   ? High cholesterol   ? diet controlled, no meds  ? ? ?Tobacco Use: ?Social History  ? ?Tobacco Use  ?Smoking Status Never  ?Smokeless Tobacco Never  ? ? ?Labs: ?Recent Review Flowsheet Data   ? ? Labs for [facility]P Cardiac and Pulmonary Rehab Latest Ref Rng & Units 09/01/2021  ? Cholestrol 0 - 200 mg/dL 117  ? LDLCALC 0 - 99 mg/dL 65  ? HDL >40 mg/dL 35(L)  ? Trlycerides <150 mg/dL  85  ? Hemoglobin A1c 4.8 - 5.6 % 5.4  ? ?  ? ? ?Capillary Blood Glucose: ?No results found for: GLUCAP ? ? ?Exercise Target Goals: ?Exercise Program Goal: ?Individual exercise prescription set using results from initial 6 min walk test and THRR while considering  patient?s activity barriers and safety.  ? ?Exercise Prescription Goal: ?Initial exercise prescription builds to 30-45 minutes a day of aerobic activity, 2-3 days per week.  Home exercise guidelines will be given to patient during program as part of exercise prescription that the participant will acknowledge. ? ?Activity Barriers & Risk Stratification: ? Activity Barriers & Cardiac Risk Stratification - 10/27/21 0842   ? ?  ? Activity Barriers & Cardiac Risk Stratification  ? Activity Barriers Other (comment);Balance Concerns   ? Comments Arthroscopic knee surgery x2, arthroscopic ankle surgery x1. Gout. Balance concerns going up/down stairs. Occassional dizziness seated to standing.   ? Cardiac Risk Stratification Moderate   ? ?  ?  ? ?  ? ? ?6 Minute Walk: ? 6 Minute Walk   ? ? [place] Name 10/27/21 0850  ?  ?  ?  ? 6 Minute Walk  ? Phase Initial    ? Distance 2362 feet    ? Walk Time 6 minutes    ? # of Rest Breaks 0    ?  MPH 4.47    ? METS 5.5    ? RPE 12    ? Perceived Dyspnea  1    ? VO2 Peak 19.24    ? Symptoms Yes (comment)    ? Comments Mild SOB    ? Resting HR 60 bpm    ? Resting BP 122/70    ? Resting Oxygen Saturation  97 %    ? Exercise Oxygen Saturation  during 6 min walk 98 %    ? Max Ex. HR 101 bpm    ? Max Ex. BP 142/84    ? 2 Minute Post BP 124/84    ? ?  ?  ? ?  ? ? ?Oxygen Initial Assessment: ? ? ?Oxygen Re-Evaluation: ? ? ?Oxygen Discharge (Final Oxygen Re-Evaluation): ? ? ?Initial Exercise Prescription: ? Initial Exercise Prescription - 10/27/21 0900   ? ?  ? Date of Initial Exercise RX and Referring Provider  ? Date 10/27/21   ? Referring Provider Lorretta Harp, MD   ? Expected Discharge Date 12/25/21   ?  ? Treadmill  ? MPH 3.2   ?  Grade 0   ? Minutes 15   ? METs 3.45   ?  ? NuStep  ? Level 4   ? SPM 85   ? Minutes 15   ? METs 3.4   ?  ? Prescription Details  ? Frequency (times per week) 3   ? Duration Progress to 30 minutes of continuous aerobic without signs/symptoms of physical distress   ?  ? Intensity  ? THRR 40-80% of Max Heartrate 64-128   ? Ratings of Perceived Exertion 11-13   ? Perceived Dyspnea 0-4   ?  ? Progression  ? Progression Continue to progress workloads to maintain intensity without signs/symptoms of physical distress.   ?  ? Resistance Training  ? Training Prescription Yes   ? Weight 5 lbs   ? Reps 10-15   ? ?  ?  ? ?  ? ? ?Perform Capillary Blood Glucose checks as needed. ? ?Exercise Prescription Changes: ? ? ?Exercise Comments: ? ? ?Exercise Goals and Review: ? ? Exercise Goals   ? ? [place] Name 10/27/21 0841  ?  ?  ?  ?  ?  ? Exercise Goals  ? Increase Physical Activity Yes      ? Intervention Provide advice, education, support and counseling about physical activity/exercise needs.;Develop an individualized exercise prescription for aerobic and resistive training based on initial evaluation findings, risk stratification, comorbidities and participant's personal goals.      ? Expected Outcomes Short Term: Attend rehab on a regular basis to increase amount of physical activity.;Long Term: Exercising regularly at least 3-5 days a week.;Long Term: Add in home exercise to make exercise part of routine and to increase amount of physical activity.      ? Increase Strength and Stamina Yes      ? Intervention Provide advice, education, support and counseling about physical activity/exercise needs.;Develop an individualized exercise prescription for aerobic and resistive training based on initial evaluation findings, risk stratification, comorbidities and participant's personal goals.      ? Expected Outcomes Short Term: Increase workloads from initial exercise prescription for resistance, speed, and METs.;Short Term: Perform  resistance training exercises routinely during rehab and add in resistance training at home;Long Term: Improve cardiorespiratory fitness, muscular endurance and strength as measured by increased METs and functional capacity (6MWT)      ? Able to understand and use  rate of perceived exertion (RPE) scale Yes      ? Intervention Provide education and explanation on how to use RPE scale      ? Expected Outcomes Short Term: Able to use RPE daily in rehab to express subjective intensity level;Long Term:  Able to use RPE to guide intensity level when exercising independently      ? Knowledge and understanding of Target Heart Rate Range (THRR) Yes      ? Intervention Provide education and explanation of THRR including how the numbers were predicted and where they are located for reference      ? Expected Outcomes Short Term: Able to state/look up THRR;Long Term: Able to use THRR to govern intensity when exercising independently;Short Term: Able to use daily as guideline for intensity in rehab      ? Able to check pulse independently Yes      ? Intervention Provide education and demonstration on how to check pulse in carotid and radial arteries.;Review the importance of being able to check your own pulse for safety during independent exercise      ? Expected Outcomes Short Term: Able to explain why pulse checking is important during independent exercise;Long Term: Able to check pulse independently and accurately      ? Understanding of Exercise Prescription Yes      ? Intervention Provide education, explanation, and written materials on patient's individual exercise prescription      ? Expected Outcomes Short Term: Able to explain program exercise prescription;Long Term: Able to explain home exercise prescription to exercise independently      ? ?  ?  ? ?  ? ? ?Exercise Goals Re-Evaluation : ? ? ?Discharge Exercise Prescription (Final Exercise Prescription Changes): ? ? ?Nutrition:  ?Target Goals: Understanding of nutrition  guidelines, daily intake of sodium '1500mg'$ , cholesterol '200mg'$ , calories 30% from fat and 7% or less from saturated fats, daily to have 5 or more servings of fruits and vegetables. ? 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

## 2021-10-27 NOTE — Progress Notes (Signed)
Cardiac Rehab Medication Review by a Nurse ? ?Does the patient  feel that his/her medications are working for him/her?  yes ? ?Has the patient been experiencing any side effects to the medications prescribed?  no ? ?Does the patient measure his/her own blood pressure or blood glucose at home?  yes  ? ?Does the patient have any problems obtaining medications due to transportation or finances?   no ? ?Understanding of regimen: good ?Understanding of indications: good ?Potential of compliance: good ? ? ? ?Nurse comments: Kevin Wallace is taking his medications as prescribed and has a good understanding of his medications. Kevin Wallace checks his blood pressures at home intermittently. Kevin Wallace said that he has experiencing some bruising and blood in his stool. Kevin Wallace says he has discussed this with Caron Presume PAC. Kevin Wallace has not seen any blood in his stool recently ? ? ? ?[name] RN ?10/27/2021 8:46 AM ?  ?

## 2021-10-27 NOTE — Progress Notes (Signed)
Short nonsustained run of PVC's noted towards the end of Kevin Wallace 6 minute walk test. Patient asymptomatic.Will fax exercise flow sheets to Dr. Kennon Holter office for review. Kevin Wallace reported that sometimes he feels lightheaded when changing positions from sitting to standing positions. Standing entry BP  122/70 heart rate 60. Standing blood pressure 112/80. Kevin Wallace admitted that he did not drink any water this morning. Patient was given water to drink. Recheck BP 112/72 sitting. Recheck Standing BP 112/74. I asked Kevin  Wallace  to make sure he hydrating adequately throughout the day. Patient states understanding.Barnet Pall, RN,BSN ?10/27/2021 1:46 PM  ?

## 2021-11-02 ENCOUNTER — Other Ambulatory Visit: Payer: Self-pay

## 2021-11-02 ENCOUNTER — Encounter (HOSPITAL_COMMUNITY)
Admission: RE | Admit: 2021-11-02 | Discharge: 2021-11-02 | Disposition: A | Discharge status: Home / Self Care | Payer: No Typology Code available for payment source | Source: Ambulatory Visit | Attending: Cardiovascular Disease | Admitting: Cardiovascular Disease

## 2021-11-02 DIAGNOSIS — I214 Non-ST elevation (NSTEMI) myocardial infarction: Secondary | ICD-10-CM

## 2021-11-02 DIAGNOSIS — Z955 Presence of coronary angioplasty implant and graft: Secondary | ICD-10-CM

## 2021-11-02 NOTE — Progress Notes (Signed)
Daily Session Note ? ?Patient Details  ?Name: Kevin Wallace ?MRN: [id number] ?Date of Birth: 16-Aug-1961 ?Referring Provider:   ?Flowsheet Row CARDIAC REHAB PHASE II ORIENTATION from 10/27/2021 in [place] [facility]  ?Referring Provider Lorretta Harp, MD  ? ?  ? ? ?Encounter Date: 11/02/2021 ? ?Check In: ? Session Check In - 11/02/21 1501   ? ?  ? Check-In  ? Supervising physician immediately available to respond to emergencies [facility] immediately available   ? Physician(s) Dr. Cruzita Lederer   ? Location [facility]   ? Staff Present Seward Carol, MS, ACSM CEP, Exercise Physiologist;Fransisco Messmer, RN, BSN;Jetta Walker BS, ACSM EP-C, Exercise Physiologist;David Medina, MS, ACSM-CEP, CCRP, Exercise Physiologist   ? Virtual Visit No   ? Medication changes reported     No   ? Fall or balance concerns reported    No   ? Tobacco Cessation No Change   ? Warm-up and Cool-down Performed as group-led instruction   ? Resistance Training Performed Yes   ? VAD Patient? No   ? PAD/SET Patient? No   ?  ? Pain Assessment  ? Currently in Pain? No/denies   ? Pain Score 0-No pain   ? Multiple Pain Sites No   ? ?  ?  ? ?  ? ? ?Capillary Blood Glucose: ?No results found for this or any previous visit (from the past 24 hour(s)). ? ? Exercise Prescription Changes - 11/02/21 1646   ? ?  ? Response to Exercise  ? Blood Pressure (Admit) 122/80   ? Blood Pressure (Exercise) 138/76   ? Blood Pressure (Exit) 110/78   ? Heart Rate (Admit) 51 bpm   ? Heart Rate (Exercise) 83 bpm   ? Heart Rate (Exit) 59 bpm   ? Rating of Perceived Exertion (Exercise) 11   ? Perceived Dyspnea (Exercise) 0   ? Symptoms 0   ? Comments Pt first day in teh CRP2 program   ? Duration Progress to 30 minutes of  aerobic without signs/symptoms of physical distress   ? Intensity THRR unchanged   ?  ? Progression  ? Progression Continue to progress workloads to maintain intensity without signs/symptoms of physical distress.    ? Average METs 3.08   ?  ? Resistance Training  ? Training Prescription Yes   ? Weight 5 lbs   ? Reps 10-15   ? Time 10 Minutes   ?  ? Treadmill  ? MPH 3.2   ? Grade 0   ? Minutes 15   ? METs 3.45   ?  ? NuStep  ? Level 4   ? SPM 85   ? Minutes 15   ? METs 2.7   ? ?  ?  ? ?  ? ? ?Social History  ? ?Tobacco Use  ?Smoking Status Never  ?Smokeless Tobacco Never  ? ? ?Goals Met:  ?Exercise tolerated well ?No report of concerns or symptoms today ?Strength training completed today ? ?Goals Unmet:  ?Not Applicable ? ?Comments: Pt started cardiac rehab today.  Pt tolerated light exercise without difficulty. VSS, telemetry-Sinus Rhythm, asymptomatic.  Medication list reconciled. Pt denies barriers to medicaiton compliance.  PSYCHOSOCIAL ASSESSMENT:  PHQ-0. Pt exhibits positive coping skills, hopeful outlook with supportive family. No psychosocial needs identified at this time, no psychosocial interventions necessary.    Pt enjoys playing golf.   Pt oriented to exercise equipment and routine.    Understanding verbalized. [name] RN BSN  ? ? ?  Dr. Fransico Him is Medical Director for Cardiac Rehab at [facility]. ?

## 2021-11-04 ENCOUNTER — Encounter (HOSPITAL_COMMUNITY)
Admission: RE | Admit: 2021-11-04 | Discharge: 2021-11-04 | Disposition: A | Discharge status: Home / Self Care | Payer: No Typology Code available for payment source | Source: Ambulatory Visit | Attending: Cardiovascular Disease | Admitting: Cardiovascular Disease

## 2021-11-04 ENCOUNTER — Other Ambulatory Visit: Payer: Self-pay

## 2021-11-04 DIAGNOSIS — I214 Non-ST elevation (NSTEMI) myocardial infarction: Secondary | ICD-10-CM | POA: Diagnosis not present

## 2021-11-04 DIAGNOSIS — Z955 Presence of coronary angioplasty implant and graft: Secondary | ICD-10-CM

## 2021-11-05 ENCOUNTER — Ambulatory Visit: Payer: No Typology Code available for payment source | Admitting: Physician Assistant

## 2021-11-06 ENCOUNTER — Encounter (HOSPITAL_COMMUNITY)
Admission: RE | Admit: 2021-11-06 | Discharge: 2021-11-06 | Disposition: A | Discharge status: Home / Self Care | Payer: No Typology Code available for payment source | Source: Ambulatory Visit | Attending: Cardiovascular Disease | Admitting: Cardiovascular Disease

## 2021-11-06 ENCOUNTER — Other Ambulatory Visit: Payer: Self-pay

## 2021-11-06 DIAGNOSIS — I214 Non-ST elevation (NSTEMI) myocardial infarction: Secondary | ICD-10-CM | POA: Diagnosis not present

## 2021-11-06 DIAGNOSIS — Z955 Presence of coronary angioplasty implant and graft: Secondary | ICD-10-CM

## 2021-11-09 ENCOUNTER — Other Ambulatory Visit: Payer: Self-pay

## 2021-11-09 ENCOUNTER — Encounter (HOSPITAL_COMMUNITY)
Admission: RE | Admit: 2021-11-09 | Discharge: 2021-11-09 | Disposition: A | Discharge status: Home / Self Care | Payer: No Typology Code available for payment source | Source: Ambulatory Visit | Attending: Cardiovascular Disease | Admitting: Cardiovascular Disease

## 2021-11-09 DIAGNOSIS — I214 Non-ST elevation (NSTEMI) myocardial infarction: Secondary | ICD-10-CM

## 2021-11-09 DIAGNOSIS — Z955 Presence of coronary angioplasty implant and graft: Secondary | ICD-10-CM

## 2021-11-11 ENCOUNTER — Encounter (HOSPITAL_COMMUNITY)
Admission: RE | Admit: 2021-11-11 | Discharge: 2021-11-11 | Disposition: A | Discharge status: Home / Self Care | Payer: No Typology Code available for payment source | Source: Ambulatory Visit | Attending: Cardiovascular Disease | Admitting: Cardiovascular Disease

## 2021-11-11 DIAGNOSIS — I214 Non-ST elevation (NSTEMI) myocardial infarction: Secondary | ICD-10-CM

## 2021-11-11 DIAGNOSIS — Z955 Presence of coronary angioplasty implant and graft: Secondary | ICD-10-CM

## 2021-11-13 ENCOUNTER — Encounter (HOSPITAL_COMMUNITY)
Admission: RE | Admit: 2021-11-13 | Discharge: 2021-11-13 | Disposition: A | Discharge status: Home / Self Care | Payer: No Typology Code available for payment source | Source: Ambulatory Visit | Attending: Cardiovascular Disease | Admitting: Cardiovascular Disease

## 2021-11-13 DIAGNOSIS — Z955 Presence of coronary angioplasty implant and graft: Secondary | ICD-10-CM

## 2021-11-13 DIAGNOSIS — I214 Non-ST elevation (NSTEMI) myocardial infarction: Secondary | ICD-10-CM

## 2021-11-16 ENCOUNTER — Encounter (HOSPITAL_COMMUNITY): Payer: No Typology Code available for payment source

## 2021-11-18 ENCOUNTER — Encounter (HOSPITAL_COMMUNITY): Payer: No Typology Code available for payment source

## 2021-11-20 ENCOUNTER — Encounter (HOSPITAL_COMMUNITY): Payer: No Typology Code available for payment source

## 2021-11-23 ENCOUNTER — Encounter (HOSPITAL_COMMUNITY): Payer: No Typology Code available for payment source

## 2021-11-25 ENCOUNTER — Encounter (HOSPITAL_COMMUNITY): Payer: No Typology Code available for payment source

## 2021-11-25 NOTE — Progress Notes (Signed)
Cardiac Individual Treatment Plan ? ?Patient Details  ?Name: Kevin Wallace ?MRN: [id number] ?Date of Birth: 02/09/1961 ?Referring Provider:   ?Flowsheet Row CARDIAC REHAB PHASE II ORIENTATION from 10/27/2021 in [facility]  ?Referring Provider Berry, Jonathan J, MD  ? ?  ? ? ?Initial Encounter Date:  ?Flowsheet Row CARDIAC REHAB PHASE II ORIENTATION from 10/27/2021 in [facility]  ?Date 10/27/21  ? ?  ? ? ?Visit Diagnosis: 08/30/21 NSTEMI ? ?08/31/21 DES LAD ? ?Patient's Home Medications on Admission: ? ?Current Outpatient Medications:  ?  allopurinol (ZYLOPRIM) 300 MG tablet, Take 300 mg by mouth daily., Disp: , Rfl:  ?  aspirin 81 MG EC tablet, Take 1 tablet (81 mg total) by mouth daily. Swallow whole., Disp: 90 tablet, Rfl: 3 ?  nitroGLYCERIN (NITROSTAT) 0.4 MG SL tablet, Place 1 tablet (0.4 mg total) under the tongue every 5 (five) minutes x 3 doses as needed for chest pain., Disp: 25 tablet, Rfl: 12 ?  rosuvastatin (CRESTOR) 40 MG tablet, Take 1 tablet (40 mg total) by mouth daily. (Patient taking differently: Take 40 mg by mouth at bedtime.), Disp: 90 tablet, Rfl: 3 ?  ticagrelor (BRILINTA) 90 MG TABS tablet, Take 1 tablet (90 mg total) by mouth 2 (two) times daily., Disp: 180 tablet, Rfl: 3 ? ?Past Medical History: ?Past Medical History:  ?Diagnosis Date  ? Allergy   ? CAD (coronary artery disease)   ? s/p DES to mLAD 08/31/21  ? Cancer ([facility]) 11/2019  ? Prostate Cancer -recently dx -surgery scheduled 02/28/20  ? Complication of anesthesia   ? took a long time to wear off   ? Gout   ? High cholesterol   ? diet controlled, no meds  ? ? ?Tobacco Use: ?Social History  ? ?Tobacco Use  ?Smoking Status Never  ?Smokeless Tobacco Never  ? ? ?Labs: ?Review Flowsheet   ? ?  ?  Latest Ref Rng & Units 09/01/2021  ?Labs for ITP Cardiac and Pulmonary Rehab  ?Cholestrol 0 - 200 mg/dL 117    ?LDL (calc) 0 - 99 mg/dL 65    ?HDL-C >40 mg/dL 35    ?Trlycerides <150 mg/dL 85     ?Hemoglobin A1c 4.8 - 5.6 % 5.4    ?  ? ? Multiple values from one day are sorted in reverse-chronological order  ?  ?  ? ? ?Capillary Blood Glucose: ?No results found for: GLUCAP ? ? ?Exercise Target Goals: ?Exercise Program Goal: ?Individual exercise prescription set using results from initial 6 min walk test and THRR while considering  patient?s activity barriers and safety.  ? ?Exercise Prescription Goal: ?Starting with aerobic activity 30 plus minutes a day, 3 days per week for initial exercise prescription. Provide home exercise prescription and guidelines that participant acknowledges understanding prior to discharge. ? ?Activity Barriers & Risk Stratification: ? Activity Barriers & Cardiac Risk Stratification - 10/27/21 0842   ? ?  ? Activity Barriers & Cardiac Risk Stratification  ? Activity Barriers Other (comment);Balance Concerns   ? Comments Arthroscopic knee surgery x2, arthroscopic ankle surgery x1. Gout. Balance concerns going up/down stairs. Occassional dizziness seated to standing.   ? Cardiac Risk Stratification Moderate   ? ?  ?  ? ?  ? ? ?6 Minute Walk: ? 6 Minute Walk   ? ? [place] Name 10/27/21 0850  ?  ?  ?  ? 6 Minute Walk  ? Phase Initial    ? Distance 2362 feet    ?   Walk Time 6 minutes    ? # of Rest Breaks 0    ? MPH 4.47    ? METS 5.5    ? RPE 12    ? Perceived Dyspnea  1    ? VO2 Peak 19.24    ? Symptoms Yes (comment)    ? Comments Mild SOB    ? Resting HR 60 bpm    ? Resting BP 122/70    ? Resting Oxygen Saturation  97 %    ? Exercise Oxygen Saturation  during 6 min walk 98 %    ? Max Ex. HR 101 bpm    ? Max Ex. BP 142/84    ? 2 Minute Post BP 124/84    ? ?  ?  ? ?  ? ? ?Oxygen Initial Assessment: ? ? ?Oxygen Re-Evaluation: ? ? ?Oxygen Discharge (Final Oxygen Re-Evaluation): ? ? ?Initial Exercise Prescription: ? Initial Exercise Prescription - 10/27/21 0900   ? ?  ? Date of Initial Exercise RX and Referring Provider  ? Date 10/27/21   ? Referring Provider Berry, Jonathan J, MD   ?  Expected Discharge Date 12/25/21   ?  ? Treadmill  ? MPH 3.2   ? Grade 0   ? Minutes 15   ? METs 3.45   ?  ? NuStep  ? Level 4   ? SPM 85   ? Minutes 15   ? METs 3.4   ?  ? Prescription Details  ? Frequency (times per week) 3   ? Duration Progress to 30 minutes of continuous aerobic without signs/symptoms of physical distress   ?  ? Intensity  ? THRR 40-80% of Max Heartrate 64-128   ? Ratings of Perceived Exertion 11-13   ? Perceived Dyspnea 0-4   ?  ? Progression  ? Progression Continue to progress workloads to maintain intensity without signs/symptoms of physical distress.   ?  ? Resistance Training  ? Training Prescription Yes   ? Weight 5 lbs   ? Reps 10-15   ? ?  ?  ? ?  ? ? ?Perform Capillary Blood Glucose checks as needed. ? ?Exercise Prescription Changes: ? ? Exercise Prescription Changes   ? ? Row Name 11/02/21 1646  ?  ?  ?  ?  ?  ? Response to Exercise  ? Blood Pressure (Admit) 122/80      ? Blood Pressure (Exercise) 138/76      ? Blood Pressure (Exit) 110/78      ? Heart Rate (Admit) 51 bpm      ? Heart Rate (Exercise) 83 bpm      ? Heart Rate (Exit) 59 bpm      ? Rating of Perceived Exertion (Exercise) 11      ? Perceived Dyspnea (Exercise) 0      ? Symptoms 0      ? Comments Pt first day in teh CRP2 program      ? Duration Progress to 30 minutes of  aerobic without signs/symptoms of physical distress      ? Intensity THRR unchanged      ?  ? Progression  ? Progression Continue to progress workloads to maintain intensity without signs/symptoms of physical distress.      ? Average METs 3.08      ?  ? Resistance Training  ? Training Prescription Yes      ? Weight 5 lbs      ? Reps 10-15      ?   Time 10 Minutes      ?  ? Treadmill  ? MPH 3.2      ? Grade 0      ? Minutes 15      ? METs 3.45      ?  ? NuStep  ? Level 4      ? SPM 85      ? Minutes 15      ? METs 2.7      ? ?  ?  ? ?  ? ? ?Exercise Comments: ? ? Exercise Comments   ? ? [place] Name 11/02/21 1650  ?  ?  ?  ?  ? Exercise Comments Pt first day in  the CRP2 program. Pt tolerated exercise well with an average MET level of 3.08. Pt is learning his THRR, RPE and ExRx      ? ?  ?  ? ?  ? ? ?Exercise Goals and Review: ? ? Exercise Goals   ? ? [place] Name 10/27/21 0841  ?  ?  ?  ?  ?  ? Exercise Goals  ? Increase Physical Activity Yes      ? Intervention Provide advice, education, support and counseling about physical activity/exercise needs.;Develop an individualized exercise prescription for aerobic and resistive training based on initial evaluation findings, risk stratification, comorbidities and participant's personal goals.      ? Expected Outcomes Short Term: Attend rehab on a regular basis to increase amount of physical activity.;Long Term: Exercising regularly at least 3-5 days a week.;Long Term: Add in home exercise to make exercise part of routine and to increase amount of physical activity.      ? Increase Strength and Stamina Yes      ? Intervention Provide advice, education, support and counseling about physical activity/exercise needs.;Develop an individualized exercise prescription for aerobic and resistive training based on initial evaluation findings, risk stratification, comorbidities and participant's personal goals.      ? Expected Outcomes Short Term: Increase workloads from initial exercise prescription for resistance, speed, and METs.;Short Term: Perform resistance training exercises routinely during rehab and add in resistance training at home;Long Term: Improve cardiorespiratory fitness, muscular endurance and strength as measured by increased METs and functional capacity (6MWT)      ? Able to understand and use rate of perceived exertion (RPE) scale Yes      ? Intervention Provide education and explanation on how to use RPE scale      ? Expected Outcomes Short Term: Able to use RPE daily in rehab to express subjective intensity level;Long Term:  Able to use RPE to guide intensity level when exercising independently      ? Knowledge and  understanding of Target Heart Rate Range (THRR) Yes      ? Intervention Provide education and explanation of THRR including how the numbers were predicted and where they are located for reference      ? Expected Ou

## 2021-11-27 ENCOUNTER — Encounter (HOSPITAL_COMMUNITY)
Admission: RE | Admit: 2021-11-27 | Discharge: 2021-11-27 | Disposition: A | Discharge status: Home / Self Care | Payer: No Typology Code available for payment source | Source: Ambulatory Visit | Attending: Cardiovascular Disease | Admitting: Cardiovascular Disease

## 2021-11-27 DIAGNOSIS — I252 Old myocardial infarction: Secondary | ICD-10-CM | POA: Diagnosis present

## 2021-11-27 DIAGNOSIS — I214 Non-ST elevation (NSTEMI) myocardial infarction: Secondary | ICD-10-CM

## 2021-11-27 DIAGNOSIS — Z955 Presence of coronary angioplasty implant and graft: Secondary | ICD-10-CM | POA: Diagnosis present

## 2021-11-27 NOTE — Progress Notes (Signed)
CARDIAC REHAB PHASE 2 ? ?Reviewed home exercise with pt today. Pt is tolerating exercise well. Pt will continue to exercise on their own by walking and golf for 30-45 minutes per session 2-4 days a week in addition to the 3 days in CRP2. Advised pt on THRR, RPE scale, hydration and temperature/humidity precautions. Reinforced NTG use, S/S to stop exercise and when to call MD vs 911. Encouraged warm up cool down and stretches with exercise sessions. Pt verbalized understanding, all questions were answered and pt was given a copy to take home.  ?  ?Kevin Wallace ACSM-CEP ?11/27/2021 ?4:50 PM ? ?

## 2021-11-30 ENCOUNTER — Encounter (HOSPITAL_COMMUNITY)
Admission: RE | Admit: 2021-11-30 | Discharge: 2021-11-30 | Disposition: A | Discharge status: Home / Self Care | Payer: No Typology Code available for payment source | Source: Ambulatory Visit | Attending: Cardiovascular Disease | Admitting: Cardiovascular Disease

## 2021-11-30 DIAGNOSIS — I252 Old myocardial infarction: Secondary | ICD-10-CM | POA: Diagnosis not present

## 2021-12-01 ENCOUNTER — Ambulatory Visit (INDEPENDENT_AMBULATORY_CARE_PROVIDER_SITE_OTHER): Payer: No Typology Code available for payment source | Admitting: Cardiovascular Disease

## 2021-12-01 ENCOUNTER — Encounter: Payer: Self-pay | Admitting: Cardiovascular Disease

## 2021-12-01 DIAGNOSIS — G4733 Obstructive sleep apnea (adult) (pediatric): Secondary | ICD-10-CM

## 2021-12-01 DIAGNOSIS — I10 Essential (primary) hypertension: Secondary | ICD-10-CM

## 2021-12-01 DIAGNOSIS — E782 Mixed hyperlipidemia: Secondary | ICD-10-CM | POA: Diagnosis not present

## 2021-12-01 DIAGNOSIS — E785 Hyperlipidemia, unspecified: Secondary | ICD-10-CM | POA: Insufficient documentation

## 2021-12-01 DIAGNOSIS — I214 Non-ST elevation (NSTEMI) myocardial infarction: Secondary | ICD-10-CM | POA: Diagnosis not present

## 2021-12-01 NOTE — Assessment & Plan Note (Signed)
History of essential hypertension blood pressure measured today at 108/68.  He is not on antihypertensive medications. ?

## 2021-12-01 NOTE — Progress Notes (Signed)
? ? ? ?12/01/2021 ?[name]   ?1961-06-25  ?[id number] ? ?Primary Physician Ginger Organ., MD ?Primary Cardiologist: Lorretta Harp MD Lupe [place], [place] ? ?HPI:  Kevin Wallace is a 61 y.o. mildly overweight but fit appearing married Caucasian male father of 2 children who works as a Publishing copy (desk job).  His cardiac risk factors notable for just hyperlipidemia.  There is no family history for heart disease.  He had a non-STEMI on 08/30/2021 and underwent cardiac catheterization the following day by Dr. Angelena Form revealing a 99% mid LAD lesion after a diagonal branch which was successfully stented using a 2.5 mm x 33 mm long [place] drug-eluting stent with an excellent result.  He had scattered nonobstructive disease otherwise with preserved ejection fraction.  He was discharged home the following day on aspirin and Brilinta.  He has had 1 nosebleed but none since.  He complains of easy bruising.  He apparently also had a recent home sleep study which was positive.  Interestingly, he did have a coronary calcium score performed by his PCP 09/05/2020 which was low (6) with a small Mehta calcium in the LAD territory.  He is fairly active and plays golf and walks. ? ? ?Current Meds  ?Medication Sig  ? allopurinol (ZYLOPRIM) 300 MG tablet Take 300 mg by mouth daily.  ? aspirin 81 MG EC tablet Take 1 tablet (81 mg total) by mouth daily. Swallow whole.  ? nitroGLYCERIN (NITROSTAT) 0.4 MG SL tablet Place 1 tablet (0.4 mg total) under the tongue every 5 (five) minutes x 3 doses as needed for chest pain.  ? rosuvastatin (CRESTOR) 40 MG tablet Take 1 tablet (40 mg total) by mouth daily. (Patient taking differently: Take 40 mg by mouth at bedtime.)  ? ticagrelor (BRILINTA) 90 MG TABS tablet Take 1 tablet (90 mg total) by mouth 2 (two) times daily.  ?  ? ?Allergies  ?Allergen Reactions  ? Colchicine Diarrhea and Nausea And Vomiting  ? ? ?Social History  ? ?Socioeconomic History  ?  Marital status: Married  ?  Spouse name: Kevin Wallace  ? Number of children: 2  ? Years of education: 68  ? Highest education level: Master's degree (e.g., MA, MS, MEng, MEd, MSW, MBA)  ?Occupational History  ? Not on file  ?Tobacco Use  ? Smoking status: Never  ? Smokeless tobacco: Never  ?Vaping Use  ? Vaping Use: Never used  ?Substance and Sexual Activity  ? Alcohol use: No  ?  Alcohol/week: 0.0 standard drinks  ? Drug use: No  ? Sexual activity: Yes  ?  Birth control/protection: None  ?Other Topics Concern  ? Not on file  ?Social History Narrative  ? Lives with wife.    ? ?Social Determinants of Health  ? ?Financial Resource Strain: Not on file  ?Food Insecurity: Not on file  ?Transportation Needs: Not on file  ?Physical Activity: Not on file  ?Stress: Not on file  ?Social Connections: Not on file  ?Intimate Partner Violence: Not on file  ?  ? ?Review of Systems: ?General: negative for chills, fever, night sweats or weight changes.  ?Cardiovascular: negative for chest pain, dyspnea on exertion, edema, orthopnea, palpitations, paroxysmal nocturnal dyspnea or shortness of breath ?Dermatological: negative for rash ?Respiratory: negative for cough or wheezing ?Urologic: negative for hematuria ?Abdominal: negative for nausea, vomiting, diarrhea, bright red blood per rectum, melena, or hematemesis ?Neurologic: negative for visual changes, syncope, or dizziness ?All other systems reviewed and are otherwise  negative except as noted above. ? ? ? ?Blood pressure 108/68, pulse (!) 56, height '5\' 11"'$  (1.803 m), weight 193 lb 9.6 oz (87.8 kg), SpO2 99 %.  ?General appearance: alert and no distress ?Neck: no adenopathy, no carotid bruit, no JVD, supple, symmetrical, trachea midline, and thyroid not enlarged, symmetric, no tenderness/mass/nodules ?Lungs: clear to auscultation bilaterally ?Heart: regular rate and rhythm, S1, S2 normal, no murmur, click, rub or gallop ?Extremities: extremities normal, atraumatic, no cyanosis or  edema ?Pulses: 2+ and symmetric ?Skin: Skin color, texture, turgor normal. No rashes or lesions ?Neurologic: Grossly normal ? ?EKG not performed today ? ?ASSESSMENT AND PLAN:  ? ?NSTEMI (non-ST elevated myocardial infarction) ([place]) ?History of CAD status post non-STEMI 08/30/2021.  Cardiac catheterization performed by Dr. Angelena Form 08/31/2021 revealed a 99% mid LAD lesion, 50% ostial diagonal branch lesion and scattered 20 to 30% lesions in his other vessels.  He had preserved EF with apical wall motion abnormality.  He underwent PCI and stenting using a 2.5 mm x 32 mm long Synergy drug-eluting stent which was successfully postdilated.  He was discharged home the following day.  He has had no recurrent symptoms.  He remains on aspirin and Brilinta.  He has had 1 nosebleed but none since and complains of easy bruising. ? ?HTN (hypertension) ?History of essential hypertension blood pressure measured today at 108/68.  He is not on antihypertensive medications. ? ?Hyperlipidemia ?History of hyperlipidemia on low-dose rosuvastatin with lipid profile performed 09/01/2021 revealing total cholesterol 117, LDL of 65 and HDL 35.  His rosuvastatin has since been uptitrated to 40 mg a day.  We will recheck a fasting lipid liver profile. ? ? ? ? ?Lorretta Harp MD FACP,FACC,FAHA, FSCAI ?12/01/2021 ?12:04 PM ?

## 2021-12-01 NOTE — Patient Instructions (Addendum)
Medication Instructions:  ?Your physician recommends that you continue on your current medications as directed. Please refer to the Current Medication list given to you today. ? ?*If you need a refill on your cardiac medications before your next appointment, please call your pharmacy* ? ? ?Lab Work: ?Your physician recommends that you have labs drawn today: lipid/liver profile  ? ?If you have labs (blood work) drawn today and your tests are completely normal, you will receive your results only by: ?MyChart Message (if you have MyChart) OR ?A paper copy in the mail ?If you have any lab test that is abnormal or we need to change your treatment, we will call you to review the results. ? ? ?Follow-Up: ?At [facility], you and your health needs are our priority.  As part of our continuing mission to provide you with exceptional heart care, we have created designated Provider Care Teams.  These Care Teams include your primary Cardiologist (physician) and Advanced Practice Providers (APPs -  Physician Assistants and Nurse Practitioners) who all work together to provide you with the care you need, when you need it. ? ?We recommend signing up for the patient portal called "MyChart".  Sign up information is provided on this After Visit Summary.  MyChart is used to connect with patients for Virtual Visits (Telemedicine).  Patients are able to view lab/test results, encounter notes, upcoming appointments, etc.  Non-urgent messages can be sent to your provider as well.   ?To learn more about what you can do with MyChart, go to [email or web address].   ? ?Your next appointment:   ?6 month(s) ? ?The format for your next appointment:   ?In Person ? ?Provider:   ?Fabian Sharp, PA-C or Caron Presume, PA-C    ? ? ?Then, Quay Burow, MD will plan to see you again in 12 month(s).  ?

## 2021-12-01 NOTE — Assessment & Plan Note (Signed)
History of CAD status post non-STEMI 08/30/2021.  Cardiac catheterization performed by Dr. Angelena Form 08/31/2021 revealed a 99% mid LAD lesion, 50% ostial diagonal branch lesion and scattered 20 to 30% lesions in his other vessels.  He had preserved EF with apical wall motion abnormality.  He underwent PCI and stenting using a 2.5 mm x 32 mm long Synergy drug-eluting stent which was successfully postdilated.  He was discharged home the following day.  He has had no recurrent symptoms.  He remains on aspirin and Brilinta.  He has had 1 nosebleed but none since and complains of easy bruising. ?

## 2021-12-01 NOTE — Assessment & Plan Note (Signed)
History of hyperlipidemia on low-dose rosuvastatin with lipid profile performed 09/01/2021 revealing total cholesterol 117, LDL of 65 and HDL 35.  His rosuvastatin has since been uptitrated to 40 mg a day.  We will recheck a fasting lipid liver profile. ?

## 2021-12-02 ENCOUNTER — Encounter (HOSPITAL_COMMUNITY): Payer: No Typology Code available for payment source

## 2021-12-02 LAB — LIPID PANEL
Chol/HDL Ratio: 2.8 ratio (ref 0.0–5.0)
Cholesterol, Total: 123 mg/dL (ref 100–199)
HDL: 44 mg/dL (ref >39)
LDL Chol Calc (NIH): 58 mg/dL (ref 0–99)
Triglycerides: 119 mg/dL (ref 0–149)
VLDL Cholesterol Cal: 21 mg/dL (ref 5–40)

## 2021-12-02 LAB — HEPATIC FUNCTION PANEL
ALT: 53 IU/L — ABNORMAL HIGH (ref 0–44)
AST: 49 IU/L — ABNORMAL HIGH (ref 0–40)
Albumin: 4.8 g/dL (ref 3.8–4.9)
Alkaline Phosphatase: 102 IU/L (ref 44–121)
Bilirubin Total: 0.5 mg/dL (ref 0.0–1.2)
Bilirubin, Direct: 0.15 mg/dL (ref 0.00–0.40)
Total Protein: 7.5 g/dL (ref 6.0–8.5)

## 2021-12-04 ENCOUNTER — Encounter (HOSPITAL_COMMUNITY): Payer: No Typology Code available for payment source

## 2021-12-07 ENCOUNTER — Encounter (HOSPITAL_COMMUNITY)
Admission: RE | Admit: 2021-12-07 | Discharge: 2021-12-07 | Disposition: A | Discharge status: Home / Self Care | Payer: No Typology Code available for payment source | Source: Ambulatory Visit | Attending: Cardiovascular Disease | Admitting: Cardiovascular Disease

## 2021-12-07 ENCOUNTER — Encounter: Payer: Self-pay | Admitting: Cardiovascular Disease

## 2021-12-07 DIAGNOSIS — I214 Non-ST elevation (NSTEMI) myocardial infarction: Secondary | ICD-10-CM

## 2021-12-07 DIAGNOSIS — Z955 Presence of coronary angioplasty implant and graft: Secondary | ICD-10-CM

## 2021-12-07 DIAGNOSIS — I252 Old myocardial infarction: Secondary | ICD-10-CM | POA: Diagnosis not present

## 2021-12-07 MED ORDER — TICAGRELOR 90 MG PO TABS: 90.0000 mg | ORAL_TABLET | Freq: Two times a day (BID) | ORAL | 3 refills | Status: DC

## 2021-12-09 ENCOUNTER — Encounter (HOSPITAL_COMMUNITY)
Admission: RE | Admit: 2021-12-09 | Discharge: 2021-12-09 | Disposition: A | Discharge status: Home / Self Care | Payer: No Typology Code available for payment source | Source: Ambulatory Visit | Attending: Cardiovascular Disease | Admitting: Cardiovascular Disease

## 2021-12-09 ENCOUNTER — Encounter (HOSPITAL_COMMUNITY): Payer: No Typology Code available for payment source

## 2021-12-09 DIAGNOSIS — I214 Non-ST elevation (NSTEMI) myocardial infarction: Secondary | ICD-10-CM

## 2021-12-09 DIAGNOSIS — Z955 Presence of coronary angioplasty implant and graft: Secondary | ICD-10-CM

## 2021-12-09 DIAGNOSIS — I252 Old myocardial infarction: Secondary | ICD-10-CM | POA: Diagnosis not present

## 2021-12-10 NOTE — Progress Notes (Signed)
Kevin Wallace 62 y.o. male ?Nutrition Note ?Kevin Wallace is motivated to make lifestyle changes to aid with cardiac/pulmonary rehab. Patient has medical history of NSTEMI, HTN, OSA, prostate cancer, hyperlipidemia. He lives at home with his wife. His wife, Kevin Wallace, was present today. She does the majority of the grocery shopping and cooking. She is concerned by Kevin Wallace slow commitment to dietary changes. He has reduced red meat/processed meats and introduced some plant based options. He likes few whole grains and continues to drink some sugary beverages. His wife reports motivation to switch to a plant based diet, patient remains apprehensive. He is interested in losing 10-15#.  ? ?Labs: AST 49, ALT 53 ? ?24 hour recall: ?Breakfast: honey nut cheerios with skim or 1% milk ?Lunch: Beyond burger on white bread OR salad (no meat, vinaigrette dressing) ?Dinner: beef, broccoli rice OR jackfruit tacos ?Drinks: soda (1-2x/week), sweet tea, water ? ?Nutrition Diagnosis ?overweight related to excessive energy intake as evidenced by a BMI of 26.9 ? ?Nutrition Intervention ?Pt?s individual nutrition plan reviewed with pt. ?Benefits of adopting Heart Healthy diet discussed.  ?Continue client-centered nutrition education by RD, as part of interdisciplinary care. ? ?Monitor/Evaluation: ?Patient reports motivation to make lifestyle changes for adherence to heart healthy diet recommendation and weight management. We discussed increasing fiber intake from fruits and vegetables, carbohydrate sources, beverages, animal proteins/plant proteins. Answered questions regarding sodium and BMI. Handouts/notes given. Patient amicable to RD suggestions and verbalizes understanding. Will follow-up as needed.  ? ?23 minutes spent in review of topics related to a heart healthy diet including sodium intake, blood sugar control, weight management, and fiber intake. ? ?Goal(s) ?Increase fiber intake. Aim for >2-3 servings of fruit daily + >4-5 servings of  vegetables daily. Make ? the plate vegetables at meals. ?Consider whole grains to support fiber intake. Continue to work on trying varieties, brands, recipes/cooking methods to find things you like. Whole grain bread, Corn, Beans, Lentils, brown rice, wild rice, farro, etc.  ?Keep up the great work with reduction of saturated fat intake (decreased red meat, processed meats such as bacon, sausage, etc). Prioritize [place], Sales executive, and fish. Limit red meat as often as possible <1-2/month. Continue to introduce plant based protein sources such as beans, lentils, soy/tofu, tempeh, nuts/seeds, etc.  ?Continue to monitor sugar intake. Continue to limit/eliminate refined carbohydrates, simple sugars, sugary beverages. Opt for water or other zero calorie beverages. Opt for products with <7g added sugar (examples: Dannon Light & Fit Greek yogurt, [employer] Protein bar, Cheerios cereal, Kix cereal, etc) ? ? ?Plan:  ?Will provide client-centered nutrition education as part of interdisciplinary care ?Monitor and evaluate progress toward nutrition goal with team. ? ? ?Aldona Bar [place], MS, RDN, LDN ? ?

## 2021-12-11 ENCOUNTER — Encounter (HOSPITAL_COMMUNITY): Payer: No Typology Code available for payment source

## 2021-12-14 ENCOUNTER — Encounter (HOSPITAL_COMMUNITY): Payer: No Typology Code available for payment source

## 2021-12-14 ENCOUNTER — Encounter (HOSPITAL_COMMUNITY)
Admission: RE | Admit: 2021-12-14 | Discharge: 2021-12-14 | Disposition: A | Discharge status: Home / Self Care | Payer: No Typology Code available for payment source | Source: Ambulatory Visit | Attending: Cardiovascular Disease | Admitting: Cardiovascular Disease

## 2021-12-14 DIAGNOSIS — Z955 Presence of coronary angioplasty implant and graft: Secondary | ICD-10-CM | POA: Diagnosis present

## 2021-12-14 DIAGNOSIS — I252 Old myocardial infarction: Secondary | ICD-10-CM | POA: Diagnosis present

## 2021-12-14 DIAGNOSIS — I214 Non-ST elevation (NSTEMI) myocardial infarction: Secondary | ICD-10-CM

## 2021-12-16 ENCOUNTER — Encounter (HOSPITAL_COMMUNITY): Payer: No Typology Code available for payment source

## 2021-12-18 ENCOUNTER — Encounter (HOSPITAL_COMMUNITY): Payer: No Typology Code available for payment source

## 2021-12-21 ENCOUNTER — Encounter (HOSPITAL_COMMUNITY)
Admission: RE | Admit: 2021-12-21 | Discharge: 2021-12-21 | Disposition: A | Discharge status: Home / Self Care | Payer: No Typology Code available for payment source | Source: Ambulatory Visit | Attending: Cardiovascular Disease | Admitting: Cardiovascular Disease

## 2021-12-21 DIAGNOSIS — Z955 Presence of coronary angioplasty implant and graft: Secondary | ICD-10-CM

## 2021-12-21 DIAGNOSIS — I252 Old myocardial infarction: Secondary | ICD-10-CM | POA: Diagnosis not present

## 2021-12-21 DIAGNOSIS — I214 Non-ST elevation (NSTEMI) myocardial infarction: Secondary | ICD-10-CM

## 2021-12-23 ENCOUNTER — Encounter (HOSPITAL_COMMUNITY)
Admission: RE | Admit: 2021-12-23 | Discharge: 2021-12-23 | Disposition: A | Discharge status: Home / Self Care | Payer: No Typology Code available for payment source | Source: Ambulatory Visit | Attending: Cardiovascular Disease | Admitting: Cardiovascular Disease

## 2021-12-23 VITALS — Ht 71.25 in | Wt 197.3 lb

## 2021-12-23 DIAGNOSIS — I252 Old myocardial infarction: Secondary | ICD-10-CM | POA: Diagnosis not present

## 2021-12-23 DIAGNOSIS — I214 Non-ST elevation (NSTEMI) myocardial infarction: Secondary | ICD-10-CM

## 2021-12-23 DIAGNOSIS — Z955 Presence of coronary angioplasty implant and graft: Secondary | ICD-10-CM

## 2021-12-23 NOTE — Progress Notes (Signed)
Cardiac Individual Treatment Plan ? ?Patient Details  ?Name: Kevin Wallace ?MRN: [id number] ?Date of Birth: 02/04/1961 ?Referring Provider:   ?Flowsheet Row CARDIAC REHAB PHASE II ORIENTATION from 10/27/2021 in [place]  ?Referring Provider Lorretta Harp, MD  ? ?  ? ? ?Initial Encounter Date:  ?Flowsheet Row CARDIAC REHAB PHASE II ORIENTATION from 10/27/2021 in [place]  ?Date 10/27/21  ? ?  ? ? ?Visit Diagnosis: 08/30/21 NSTEMI ? ?08/31/21 DES LAD ? ?Patient's Home Medications on Admission: ? ?Current Outpatient Medications:  ?  allopurinol (ZYLOPRIM) 300 MG tablet, Take 300 mg by mouth daily., Disp: , Rfl:  ?  aspirin 81 MG EC tablet, Take 1 tablet (81 mg total) by mouth daily. Swallow whole., Disp: 90 tablet, Rfl: 3 ?  nitroGLYCERIN (NITROSTAT) 0.4 MG SL tablet, Place 1 tablet (0.4 mg total) under the tongue every 5 (five) minutes x 3 doses as needed for chest pain., Disp: 25 tablet, Rfl: 12 ?  rosuvastatin (CRESTOR) 40 MG tablet, Take 1 tablet (40 mg total) by mouth daily. (Patient taking differently: Take 40 mg by mouth at bedtime.), Disp: 90 tablet, Rfl: 3 ?  ticagrelor (BRILINTA) 90 MG TABS tablet, Take 1 tablet (90 mg total) by mouth 2 (two) times daily., Disp: 180 tablet, Rfl: 3 ? ?Past Medical History: ?Past Medical History:  ?Diagnosis Date  ? Allergy   ? CAD (coronary artery disease)   ? s/p DES to mLAD 08/31/21  ? Cancer ([place]) 11/2019  ? Prostate Cancer -recently dx -surgery scheduled 02/28/20  ? Complication of anesthesia   ? took a long time to wear off   ? Gout   ? High cholesterol   ? diet controlled, no meds  ? ? ?Tobacco Use: ?Social History  ? ?Tobacco Use  ?Smoking Status Never  ?Smokeless Tobacco Never  ? ? ?Labs: ?Review Flowsheet   ? ?  ?  Latest Ref Rng & Units 09/01/2021 12/01/2021  ?Labs for ITP Cardiac and Pulmonary Rehab  ?Cholestrol 100 - 199 mg/dL 117   123    ?LDL (calc) 0 - 99 mg/dL 65   58    ?HDL-C >39 mg/dL 35   44     ?Trlycerides 0 - 149 mg/dL 85   119    ?Hemoglobin A1c 4.8 - 5.6 % 5.4     ?  ? ? Multiple values from one day are sorted in reverse-chronological order  ?  ?  ? ? ?Capillary Blood Glucose: ?No results found for: GLUCAP ? ? ?Exercise Target Goals: ?Exercise Program Goal: ?Individual exercise prescription set using results from initial 6 min walk test and THRR while considering  patient?s activity barriers and safety.  ? ?Exercise Prescription Goal: ?Starting with aerobic activity 30 plus minutes a day, 3 days per week for initial exercise prescription. Provide home exercise prescription and guidelines that participant acknowledges understanding prior to discharge. ? ?Activity Barriers & Risk Stratification: ? Activity Barriers & Cardiac Risk Stratification - 10/27/21 0842   ? ?  ? Activity Barriers & Cardiac Risk Stratification  ? Activity Barriers Other (comment);Balance Concerns   ? Comments Arthroscopic knee surgery x2, arthroscopic ankle surgery x1. Gout. Balance concerns going up/down stairs. Occassional dizziness seated to standing.   ? Cardiac Risk Stratification Moderate   ? ?  ?  ? ?  ? ? ?6 Minute Walk: ? 6 Minute Walk   ? ? [place] Name 10/27/21 0850 12/23/21 1644  ?  ?  ?  6 Minute Walk  ? Phase Initial Discharge   ? Distance 2362 feet 2363 feet   ? Distance % Change -- 0.04 %   ? Distance Feet Change -- 1 ft   ? Walk Time 6 minutes 6 minutes   ? # of Rest Breaks 0 0   ? MPH 4.47 4.48   ? METS 5.5 5.42   ? RPE 12 11   ? Perceived Dyspnea  1 0   ? VO2 Peak 19.24 18.95   ? Symptoms Yes (comment) No   ? Comments Mild SOB --   ? Resting HR 60 bpm 51 bpm   ? Resting BP 122/70 130/70   ? Resting Oxygen Saturation  97 % 97 %   ? Exercise Oxygen Saturation  during 6 min walk 98 % 97 %   ? Max Ex. HR 101 bpm 103 bpm   ? Max Ex. BP 142/84 132/68   ? 2 Minute Post BP 124/84 --   ? ?  ?  ? ?  ? ? ?Oxygen Initial Assessment: ? ? ?Oxygen Re-Evaluation: ? ? ?Oxygen Discharge (Final Oxygen Re-Evaluation): ? ? ?Initial  Exercise Prescription: ? Initial Exercise Prescription - 10/27/21 0900   ? ?  ? Date of Initial Exercise RX and Referring Provider  ? Date 10/27/21   ? Referring Provider Lorretta Harp, MD   ? Expected Discharge Date 12/25/21   ?  ? Treadmill  ? MPH 3.2   ? Grade 0   ? Minutes 15   ? METs 3.45   ?  ? NuStep  ? Level 4   ? SPM 85   ? Minutes 15   ? METs 3.4   ?  ? Prescription Details  ? Frequency (times per week) 3   ? Duration Progress to 30 minutes of continuous aerobic without signs/symptoms of physical distress   ?  ? Intensity  ? THRR 40-80% of Max Heartrate 64-128   ? Ratings of Perceived Exertion 11-13   ? Perceived Dyspnea 0-4   ?  ? Progression  ? Progression Continue to progress workloads to maintain intensity without signs/symptoms of physical distress.   ?  ? Resistance Training  ? Training Prescription Yes   ? Weight 5 lbs   ? Reps 10-15   ? ?  ?  ? ?  ? ? ?Perform Capillary Blood Glucose checks as needed. ? ?Exercise Prescription Changes: ? ? Exercise Prescription Changes   ? ? [place] Name 11/02/21 1646 11/27/21 1643 12/09/21 1625 12/23/21 1656  ?  ?  ? Response to Exercise  ? Blood Pressure (Admit) 122/80 124/76 112/74 130/70   ? Blood Pressure (Exercise) 138/76 130/80 128/82 132/68   ? Blood Pressure (Exit) 110/78 122/82 144/78 108/70   ? Heart Rate (Admit) 51 bpm 56 bpm 62 bpm 51 bpm   ? Heart Rate (Exercise) 83 bpm 94 bpm 101 bpm 103 bpm   ? Heart Rate (Exit) 59 bpm 66 bpm 65 bpm 58 bpm   ? Rating of Perceived Exertion (Exercise) 11 11 11.5 11   ? Perceived Dyspnea (Exercise) 0 0 0 0   ? Symptoms 0 0 0 0   ? Comments Pt first day in teh CRP2 program Reviewed MET's, goals and home ExRx Reviewed MET's Pt graduated the CRP2 program   ? Duration Progress to 30 minutes of  aerobic without signs/symptoms of physical distress Progress to 30 minutes of  aerobic without signs/symptoms of physical distress Progress to 30  minutes of  aerobic without signs/symptoms of physical distress Progress to 30 minutes  of  aerobic without signs/symptoms of physical distress   ? Intensity THRR unchanged THRR unchanged THRR unchanged THRR unchanged   ?  ? Progression  ? Progression Continue to progress workloads to maintain intensity without signs/symptoms of physical distress. Continue to progress workloads to maintain intensity without signs/symptoms of physical distress. Continue to progress workloads to maintain intensity without signs/symptoms of physical distress. Continue to progress workloads to maintain intensity without signs/symptoms of physical distress.   ? Average METs 3.08 3.54 3.92 4.26   ?  ? Resistance Training  ? Training Prescription Yes Yes No No   ? Weight 5 lbs 5 lbs -- --   ? Reps 10-15 10-15 -- --   ? Time 10 Minutes 10 Minutes -- --   ?  ? Treadmill  ? MPH 3.2 3.4 3.4 3.4   ? Grade 0 $Remov'1 2 3   'jkkqAU$ ? Minutes $RemoveB'15 15 15 15   'zDBWTDkH$ ? METs 3.45 4.07 4.57 5.01   ?  ? NuStep  ? Level $Remov'4 4 4 'dgQjYR$ 3.5   ? SPM 85 85 110 110   ? Minutes $RemoveB'15 15 15 15   'OvKaiVKl$ ? METs 2.7 3 3.3 3.5   ?  ? Home Exercise Plan  ? Plans to continue exercise at -- Home (comment) Home (comment) Home (comment)   ? Frequency -- Add 3 additional days to program exercise sessions. Add 3 additional days to program exercise sessions. Add 3 additional days to program exercise sessions.   ? Initial Home Exercises Provided -- 11/27/21 11/27/21 11/27/21   ? ?  ?  ? ?  ? ? ?Exercise Comments: ? ? Exercise Comments   ? ? [place] Name 11/02/21 1650 11/27/21 1649 12/09/21 1628 12/23/21 1700  ?  ? Exercise Comments Pt first day in the CRP2 program. Pt tolerated exercise well with an average MET level of 3.08. Pt is learning his THRR, RPE and ExRx Reviewed MET's, goals and home ExRx. Pt tolerated exercise well with an average MET level of 3.54. Pt met with RD today which was a gain and feels like he is gaining more stamina. Pt will walk and play golf for exercise 2-4 days for 30-45 mins. Reviewed MET's. Pt tolerated exercise well with an average MET level of 3.92. Pt is increasing MET's  and strength. Pt decided to graduated the [place] program today a little early per pt request. Pt tolerated exercise well with an average MET level of4.26 . Pt met with RD today which was a gain and feels like

## 2021-12-23 NOTE — Progress Notes (Signed)
Discharge Progress Report  Patient Details  Name: Kevin Wallace MRN: [id number] Date of Birth: 1961-01-15 Referring Provider:   Flowsheet Row CARDIAC REHAB PHASE II ORIENTATION from 10/27/2021 in [place]  Referring Provider Kevin Harp, MD        Number of Visits: 13  Reason for Discharge:  Patient reached a stable level of exercise. Patient independent in their exercise. Patient has met program and personal goals.  Smoking History:  Social History   Tobacco Use  Smoking Status Never  Smokeless Tobacco Never    Diagnosis:  08/30/21 NSTEMI  08/31/21 DES LAD  ADL [facility]SD:   Initial Exercise Prescription:  Initial Exercise Prescription - 10/27/21 0900       Date of Initial Exercise RX and Referring Provider   Date 10/27/21    Referring Provider Kevin Harp, MD    Expected Discharge Date 12/25/21      Treadmill   MPH 3.2    Grade 0    Minutes 15    METs 3.45      NuStep   Level 4    SPM 85    Minutes 15    METs 3.4      Prescription Details   Frequency (times per week) 3    Duration Progress to 30 minutes of continuous aerobic without signs/symptoms of physical distress      Intensity   THRR 40-80% of Max Heartrate 64-128    Ratings of Perceived Exertion 11-13    Perceived Dyspnea 0-4      Progression   Progression Continue to progress workloads to maintain intensity without signs/symptoms of physical distress.      Resistance Training   Training Prescription Yes    Weight 5 lbs    Reps 10-15             Discharge Exercise Prescription (Final Exercise Prescription Changes):  Exercise Prescription Changes - 12/23/21 1656       Response to Exercise   Blood Pressure (Admit) 130/70    Blood Pressure (Exercise) 132/68    Blood Pressure (Exit) 108/70    Heart Rate (Admit) 51 bpm    Heart Rate (Exercise) 103 bpm    Heart Rate (Exit) 58 bpm    Rating of Perceived Exertion (Exercise) 11     Perceived Dyspnea (Exercise) 0    Symptoms 0    Comments Pt graduated the CRP2 program    Duration Progress to 30 minutes of  aerobic without signs/symptoms of physical distress    Intensity THRR unchanged      Progression   Progression Continue to progress workloads to maintain intensity without signs/symptoms of physical distress.    Average METs 4.26      Resistance Training   Training Prescription No      Treadmill   MPH 3.4    Grade 3    Minutes 15    METs 5.01      NuStep   Level 3.5    SPM 110    Minutes 15    METs 3.5      Home Exercise Plan   Plans to continue exercise at Home (comment)    Frequency Add 3 additional days to program exercise sessions.    Initial Home Exercises Provided 11/27/21             Functional Capacity:  6 Minute Walk     Row Name 10/27/21 0850 12/23/21 1644  6 Minute Walk   Phase Initial Discharge    Distance 2362 feet 2363 feet    Distance % Change -- 0.04 %    Distance Feet Change -- 1 ft    Walk Time 6 minutes 6 minutes    # of Rest Breaks 0 0    MPH 4.47 4.48    METS 5.5 5.42    RPE 12 11    Perceived Dyspnea  1 0    VO2 Peak 19.24 18.95    Symptoms Yes (comment) No    Comments Mild SOB --    Resting HR 60 bpm 51 bpm    Resting BP 122/70 130/70    Resting Oxygen Saturation  97 % 97 %    Exercise Oxygen Saturation  during 6 min walk 98 % 97 %    Max Ex. HR 101 bpm 103 bpm    Max Ex. BP 142/84 132/68    2 Minute Post BP 124/84 --             Psychological, QOL, Others - Outcomes: PHQ 2/9:    12/23/2021    4:12 PM 10/27/2021    1:49 PM 10/27/2021    8:44 AM  Depression screen PHQ 2/9  Decreased Interest 0 0 0  Down, Depressed, Hopeless 0 0 0  PHQ - 2 Score 0 0 0    Quality of Life:  Quality of Life - 12/23/21 1655       Quality of Life   Select Quality of Life      Quality of Life Scores   Health/Function Post 26 %    Socioeconomic Post 26 %    Psych/Spiritual Post 24 %    Family Post 30  %    GLOBAL Post 26.18 %             Personal Goals: Goals established at orientation with interventions provided to work toward goal.  Personal Goals and Risk Factors at Admission - 10/27/21 0819       Core Components/Risk Factors/Patient Goals on Admission   Hypertension --    Intervention --    Expected Outcomes --    Lipids Yes    Intervention Provide education and support for participant on nutrition & aerobic/resistive exercise along with prescribed medications to achieve LDL '70mg'$ , HDL >$Remo'40mg'vUrIV$ .    Expected Outcomes Short Term: Participant states understanding of desired cholesterol values and is compliant with medications prescribed. Participant is following exercise prescription and nutrition guidelines.;Long Term: Cholesterol controlled with medications as prescribed, with individualized exercise RX and with personalized nutrition plan. Value goals: LDL < $Rem'70mg'mvyS$ , HDL > 40 mg.    Personal Goal Other Yes    Personal Goal Get nutrition information on heart healthy and low sodium diet.    Intervention Provide education on heart healthy and low sodium diet and nutrition counseling with dietitian.    Expected Outcomes Participant demonstrates understanding and is following nutrition guidelines.              Personal Goals Discharge:  Goals and Risk Factor Review     Row Name 11/03/21 0800 11/25/21 1329 12/23/21 1427         Core Components/Risk Factors/Patient Goals Review   Personal Goals Review Lipids Lipids Lipids     Review Kevin Wallace started cardiac rehab on 11/02/21. Kevin Wallace did well with exercise Kevin Wallace has been doing well with exercise at phase 2 cardiac rehab. Vital signs have been stable. Kevin Wallace has been doing well with exercise  at phase 2 cardiac rehab. Vital signs have been stable. Kevin Wallace wil complete phase 2 cardiac rehab on 12/25/21.     Expected Outcomes Kevin Wallace will continue to participate in phase 2 cardiac rehab for exercise, nutrition and lifestyle modifications Kevin Wallace will  continue to participate in phase 2 cardiac rehab for exercise, nutrition and lifestyle modifications Kevin Wallace will continue to participate in phase 2 cardiac rehab for exercise, nutrition and lifestyle modifications              Exercise Goals and Review:  Exercise Goals     Row Name 10/27/21 0841             Exercise Goals   Increase Physical Activity Yes       Intervention Provide advice, education, support and counseling about physical activity/exercise needs.;Develop an individualized exercise prescription for aerobic and resistive training based on initial evaluation findings, risk stratification, comorbidities and participant's personal goals.       Expected Outcomes Short Term: Attend rehab on a regular basis to increase amount of physical activity.;Long Term: Exercising regularly at least 3-5 days a week.;Long Term: Add in home exercise to make exercise part of routine and to increase amount of physical activity.       Increase Strength and Stamina Yes       Intervention Provide advice, education, support and counseling about physical activity/exercise needs.;Develop an individualized exercise prescription for aerobic and resistive training based on initial evaluation findings, risk stratification, comorbidities and participant's personal goals.       Expected Outcomes Short Term: Increase workloads from initial exercise prescription for resistance, speed, and METs.;Short Term: Perform resistance training exercises routinely during rehab and add in resistance training at home;Long Term: Improve cardiorespiratory fitness, muscular endurance and strength as measured by increased METs and functional capacity (6MWT)       Able to understand and use rate of perceived exertion (RPE) scale Yes       Intervention Provide education and explanation on how to use RPE scale       Expected Outcomes Short Term: Able to use RPE daily in rehab to express subjective intensity level;Long Term:  Able to  use RPE to guide intensity level when exercising independently       Knowledge and understanding of Target Heart Rate Range (THRR) Yes       Intervention Provide education and explanation of THRR including how the numbers were predicted and where they are located for reference       Expected Outcomes Short Term: Able to state/look up THRR;Long Term: Able to use THRR to govern intensity when exercising independently;Short Term: Able to use daily as guideline for intensity in rehab       Able to check pulse independently Yes       Intervention Provide education and demonstration on how to check pulse in carotid and radial arteries.;Review the importance of being able to check your own pulse for safety during independent exercise       Expected Outcomes Short Term: Able to explain why pulse checking is important during independent exercise;Long Term: Able to check pulse independently and accurately       Understanding of Exercise Prescription Yes       Intervention Provide education, explanation, and written materials on patient's individual exercise prescription       Expected Outcomes Short Term: Able to explain program exercise prescription;Long Term: Able to explain home exercise prescription to exercise independently  Exercise Goals Re-Evaluation:  Exercise Goals Re-Evaluation     Row Name 11/02/21 1648 11/27/21 1645 12/23/21 1658         Exercise Goal Re-Evaluation   Exercise Goals Review Increase Physical Activity;Increase Strength and Stamina;Able to understand and use rate of perceived exertion (RPE) scale;Knowledge and understanding of Target Heart Rate Range (THRR);Understanding of Exercise Prescription Increase Physical Activity;Increase Strength and Stamina;Able to understand and use rate of perceived exertion (RPE) scale;Knowledge and understanding of Target Heart Rate Range (THRR);Understanding of Exercise Prescription Increase Physical Activity;Increase Strength  and Stamina;Able to understand and use rate of perceived exertion (RPE) scale;Knowledge and understanding of Target Heart Rate Range (THRR);Understanding of Exercise Prescription     Comments Pt first day in the [facility]2 program. Pt tolerated exercise well with an average MET level of 3.08. Pt is learning his THRR, RPE and ExRx Reviewed MET's, goals and home ExRx. Pt tolerated exercise well with an average MET level of 3.54. Pt met with RD today which was a gain and feels like he is gaining more stamina. Pt will walk and play golf for exercise 2-4 days for 30-45 mins. Pt decided to graduated the [place] program today a little early per pt request. Pt tolerated exercise well with an average MET level of4.26 . Pt met with RD today which was a gain and feels like he is gaining more stamina. Pt will walk, go to the gym and play golf for exercise 5 days for 30-45 mins.     Expected Outcomes Will continue to monitor pt and progress workloads as tolerated without sigh or symptom Pt will continue to exercise on his own. Will continue to monitor pt and progress workloads as tolerated without sign or symptom. Pt will continue to gain strength              Nutrition & Weight - Outcomes:  Pre Biometrics - 10/27/21 0812       Pre Biometrics   Waist Circumference 38 inches    Hip Circumference 41 inches    Waist to Hip Ratio 0.93 %    Triceps Skinfold 11.5 mm    % Body Fat 24.6 %    Grip Strength 46 kg    Flexibility 0 in    Single Leg Stand 30 seconds             Post Biometrics - 12/23/21 1650        Post  Biometrics   Height 5' 11.25" (1.81 m)    Weight 89.5 kg    Waist Circumference 38.75 inches    Hip Circumference 41.5 inches    Waist to Hip Ratio 0.93 %    BMI (Calculated) 27.32    Triceps Skinfold 12 mm    % Body Fat 25.3 %    Grip Strength 50 kg    Flexibility 9 in    Single Leg Stand 30 seconds             Nutrition:  Nutrition Therapy & Goals - 12/23/21 1537        Nutrition Therapy   Diet Heart Healthy Diet    Drug/Food Interactions Statins/Certain Fruits      Personal Nutrition Goals   Comments Patient will graduate cardiac rehab today. Patient reports improved nutrition understanding of heart healthy diet.      Intervention Plan   Intervention Prescribe, educate and counsel regarding individualized specific dietary modifications aiming towards targeted core components such as weight, hypertension, lipid management, diabetes, heart failure and  other comorbidities.;Nutrition handout(s) given to patient.    Expected Outcomes Short Term Goal: Understand basic principles of dietary content, such as calories, fat, sodium, cholesterol and nutrients.;Short Term Goal: A plan has been developed with personal nutrition goals set during dietitian appointment.;Long Term Goal: Adherence to prescribed nutrition plan.             Nutrition Discharge:   Education Questionnaire Score:  Knowledge Questionnaire Score - 12/23/21 1652       Knowledge Questionnaire Score   Post Score 23/24             Goals reviewed with patient; copy given to patient.Pt graduated from cardiac rehab program on 12/23/21  with completion of 13 exercise sessions in Phase II. Pt maintained good attendance and progressed nicely during his participation in rehab as evidenced by increased MET level.   Medication list reconciled. Repeat  PHQ score-  .  Pt has made significant lifestyle changes and should be commended for his success. Pt feels he has achieved his goals during cardiac rehab.   Pt plans to continue exercise by walking going to the gym and walking. We are proud of Kevin Wallace's progress! [name] RN BSN

## 2021-12-24 ENCOUNTER — Encounter: Payer: Self-pay | Admitting: Cardiovascular Disease

## 2021-12-24 ENCOUNTER — Ambulatory Visit (INDEPENDENT_AMBULATORY_CARE_PROVIDER_SITE_OTHER): Payer: No Typology Code available for payment source | Admitting: Cardiovascular Disease

## 2021-12-24 VITALS — BP 130/80 | HR 48 | Ht 71.0 in | Wt 191.4 lb

## 2021-12-24 DIAGNOSIS — I214 Non-ST elevation (NSTEMI) myocardial infarction: Secondary | ICD-10-CM

## 2021-12-24 DIAGNOSIS — R0683 Snoring: Secondary | ICD-10-CM

## 2021-12-24 DIAGNOSIS — E785 Hyperlipidemia, unspecified: Secondary | ICD-10-CM

## 2021-12-24 DIAGNOSIS — G4733 Obstructive sleep apnea (adult) (pediatric): Secondary | ICD-10-CM

## 2021-12-24 DIAGNOSIS — R400 Somnolence: Secondary | ICD-10-CM

## 2021-12-24 NOTE — Patient Instructions (Signed)
Medication Instructions:  ? ?No changes ?*If you need a refill on your cardiac medications before your next appointment, please call your pharmacy* ? ? ?Lab Work: ? ?Not needed ? ? ?Testing/Procedures: ? ?Not needed ? ?Follow-Up: ?At [facility], you and your health needs are our priority.  As part of our continuing mission to provide you with exceptional heart care, we have created designated Provider Care Teams.  These Care Teams include your primary Cardiologist (physician) and Advanced Practice Providers (APPs -  Physician Assistants and Nurse Practitioners) who all work together to provide you with the care you need, when you need it. ? ?  ? ?Your next appointment:   ?5 month(s) ? ?The format for your next appointment:   ?In Person ? ?Provider:   ?Dr Shelva Majestic ? ? ?Other Instructions  ? pressure changes will done to your  new machine C-PAP  ?Needs ResAir11 auto  ?Mask N30i ?Pressure 7 -18  ?EPR 3 ?

## 2021-12-24 NOTE — Progress Notes (Signed)
Cardiology Office Note    Date:  01/03/2022   ID:  Kevin Wallace, DOB 1961/08/16, MRN [id number]  PCP:  Ginger Organ., MD  Cardiologist:  Shelva Majestic, MD (sleep); Kevin Wallace  New sleep evaluation   History of Present Illness:  Kevin Wallace is a 61 y.o. male who is followed by Kevin Wallace for cardiology care.  He suffered a non-ST segment elevation myocardial infarction in August 30, 2021 and cardiac catheterization revealed subtotal mid LAD stenosis with 50% ostial diagonal stenosis and scattered 20 to 30% lesions in other vessels.  He underwent successful PCI to his LAD with insertion of a 2.5 x 32 mm Synergy stent.  Subsequently, he has felt well and has been without recurrent anginal symptomatology.  He was seen by Kevin Sharp, PA on September 10, 2021 and due to complaints of snoring daytime sleepiness a sleep study was recommended.  He apparently was referred for a WatchPAT home sleep test utilizing peripheral arterial tonometry on September 17, 2021 which was interpreted by Kevin Wallace.  He was Wallace to have mild overall sleep apnea with an AHI of 11.6.  Oxygen desaturated to a nadir of 81%.  He spent 17.6% of total sleep time and REM sleep but there is no data regarding the severity of his sleep apnea during REM sleep.  Apparently, he now presents to my schedule for further evaluation and treatment of his sleep sleep disordered breathing.  An Epworth Sleepiness Scale score was calculated in the office today and this endorsed at 10 as shown below:   Epworth Sleepiness Scale: Situation   Chance of Dozing/Sleeping (0 = never , 1 = slight chance , 2 = moderate chance , 3 = high chance )   sitting and reading 1   watching TV 2   sitting inactive in a public place 0   being a passenger in a motor vehicle for an hour or more 2   lying down in the afternoon 3   sitting and talking to someone 0   sitting quietly after lunch (no alcohol) 2   while stopped for a few minutes in traffic as  the driver 0   Total Score  10    He denies any bruxism, restless legs, hypnagogic or hypnopompic hallucinations or cataplectic events.   Past Medical History:  Diagnosis Date   Allergy    CAD (coronary artery disease)    s/p DES to mLAD 08/31/21   Cancer ([place]) 11/2019   Prostate Cancer -recently dx -surgery scheduled 09/26/92   Complication of anesthesia    took a long time to wear off    Gout    High cholesterol    diet controlled, no meds    Past Surgical History:  Procedure Laterality Date   ANKLE ARTHROSCOPY     "had it cleaned out"   CARDIAC CATHETERIZATION     COLONOSCOPY  10/14/2014   KNEE ARTHROSCOPY  07/2019   KNEE ARTHROSCOPY  1980s   LEFT HEART CATH AND CORONARY ANGIOGRAPHY N/A 08/31/2021   Procedure: LEFT HEART CATH AND CORONARY ANGIOGRAPHY;  Surgeon: Burnell Blanks, MD;  Location: [facility];  Service: Cardiovascular;  Laterality: N/A;   LYMPHADENECTOMY Bilateral 02/28/2020   Procedure: LYMPHADENECTOMY, PELVIC;  Surgeon: Raynelle Bring, MD;  Location: [facility];  Service: Urology;  Laterality: Bilateral;   ROBOT ASSISTED LAPAROSCOPIC RADICAL PROSTATECTOMY N/A 02/28/2020   Procedure: XI ROBOTIC ASSISTED LAPAROSCOPIC RADICAL PROSTATECTOMY LEVEL 2;  Surgeon: Raynelle Bring, MD;  Location:  [facility];  Service: Urology;  Laterality: N/A;    Current Medications: Outpatient Medications Prior to Visit  Medication Sig Dispense Refill   allopurinol (ZYLOPRIM) 300 MG tablet Take 300 mg by mouth daily.     aspirin 81 MG EC tablet Take 1 tablet (81 mg total) by mouth daily. Swallow whole. 90 tablet 3   nitroGLYCERIN (NITROSTAT) 0.4 MG SL tablet Place 1 tablet (0.4 mg total) under the tongue every 5 (five) minutes x 3 doses as needed for chest pain. 25 tablet 12   rosuvastatin (CRESTOR) 40 MG tablet Take 1 tablet (40 mg total) by mouth daily. (Patient taking differently: Take 40 mg by mouth at bedtime.) 90 tablet 3   ticagrelor (BRILINTA) 90 MG TABS tablet Take 1  tablet (90 mg total) by mouth 2 (two) times daily. 180 tablet 3   No facility-administered medications prior to visit.     Allergies:   Colchicine   Social History   Socioeconomic History   Marital status: Married    Spouse name: [name]   Number of children: 2   Years of education: 18   Highest education level: Master's degree (e.g., MA, MS, MEng, MEd, MSW, MBA)  Occupational History   Not on file  Tobacco Use   Smoking status: Never   Smokeless tobacco: Never  Vaping Use   Vaping Use: Never used  Substance and Sexual Activity   Alcohol use: No    Alcohol/week: 0.0 standard drinks   Drug use: No   Sexual activity: Yes    Birth control/protection: None  Other Topics Concern   Not on file  Social History Narrative   Lives with wife.     Social Determinants of Health   Financial Resource Strain: Not on file  Food Insecurity: Not on file  Transportation Needs: Not on file  Physical Activity: Not on file  Stress: Not on file  Social Connections: Not on file     Family History:  The patient's family history includes Rheumatic fever (age of onset: 58) in his father.   ROS General: Negative; No fevers, chills, or night sweats;  HEENT: Negative; No changes in vision or hearing, sinus congestion, difficulty swallowing Pulmonary: Negative; No cough, wheezing, shortness of breath, hemoptysis Cardiovascular: Negative; No chest pain, presyncope, syncope, palpitations GI: Negative; No nausea, vomiting, diarrhea, or abdominal pain GU: Negative; No dysuria, hematuria, or difficulty voiding Musculoskeletal: Negative; no myalgias, joint pain, or weakness Hematologic/Oncology: Negative; no easy bruising, bleeding Endocrine: Negative; no heat/cold intolerance; no diabetes Neuro: Negative; no changes in balance, headaches Skin: Negative; No rashes or skin lesions Psychiatric: Negative; No behavioral problems, depression Sleep: Negative; No snoring, daytime sleepiness,  hypersomnolence, bruxism, restless legs, hypnogognic hallucinations, no cataplexy Other comprehensive 14 point system review is negative.   PHYSICAL EXAM:   VS:  BP 130/80   Pulse (!) 48   Ht $R'5\' 11"'Pr$  (1.803 m)   Wt 191 lb 6.4 oz (86.8 kg)   SpO2 96%   BMI 26.69 kg/m     Repeat blood pressure by me was 122/76  Wt Readings from Last 3 Encounters:  12/24/21 191 lb 6.4 oz (86.8 kg)  12/23/21 197 lb 5 oz (89.5 kg)  12/01/21 193 lb 9.6 oz (87.8 kg)    General: Alert, oriented, no distress.  Skin: normal turgor, no rashes, warm and dry HEENT: Normocephalic, atraumatic. Pupils equal round and reactive to light; sclera anicteric; extraocular muscles intact;  Nose without nasal septal hypertrophy Mouth/Parynx benign; Mallinpatti scale 3 Neck:  No JVD, no carotid bruits; normal carotid upstroke Lungs: clear to ausculatation and percussion; no wheezing or rales Chest wall: without tenderness to palpitation Heart: PMI not displaced, RRR, s1 s2 normal, 1/6 systolic murmur, no diastolic murmur, no rubs, gallops, thrills, or heaves Abdomen: soft, nontender; no hepatosplenomehaly, BS+; abdominal aorta nontender and not dilated by palpation. Back: no CVA tenderness Pulses 2+ Musculoskeletal: full range of motion, normal strength, no joint deformities Extremities: no clubbing cyanosis or edema, Homan's sign negative  Neurologic: grossly nonfocal; Cranial nerves grossly wnl Psychologic: Normal mood and affect   Studies/Labs Reviewed:   Dec 24, 2021 ECG (independently read by me): Sinus bradycardia at 48; no ectopy, normal intervals   Recent Labs:    Latest Ref Rng & Units 09/05/2021   12:55 AM 09/01/2021    4:04 AM 08/31/2021    4:52 AM  BMP  Glucose 70 - 99 mg/dL 115   97   94    BUN 6 - 20 mg/dL $Remove'21   7   11    'ZzLfMlN$ Creatinine 0.61 - 1.24 mg/dL 1.16   0.84   0.77    Sodium 135 - 145 mmol/L 133   138   138    Potassium 3.5 - 5.1 mmol/L 3.8   4.0   3.8    Chloride 98 - 111 mmol/L 100   106    107    CO2 22 - 32 mmol/L $RemoveB'21   24   24    'ymkyEgfo$ Calcium 8.9 - 10.3 mg/dL 9.1   8.7   8.3          Latest Ref Rng & Units 12/01/2021   12:49 PM 09/05/2021   12:55 AM  Hepatic Function  Total Protein 6.0 - 8.5 g/dL 7.5   7.1    Albumin 3.8 - 4.9 g/dL 4.8   3.9    AST 0 - 40 IU/L 49   41    ALT 0 - 44 IU/L 53   43    Alk Phosphatase 44 - 121 IU/L 102   68    Total Bilirubin 0.0 - 1.2 mg/dL 0.5   0.9    Bilirubin, Direct 0.00 - 0.40 mg/dL 0.15          Latest Ref Rng & Units 10/13/2021   11:28 AM 09/05/2021   12:55 AM 09/01/2021    4:04 AM  CBC  WBC 3.4 - 10.8 x10E3/uL 7.1   10.4   7.9    Hemoglobin 13.0 - 17.7 g/dL 17.0   17.2   15.5    Hematocrit 37.5 - 51.0 % 49.6   50.8   46.5    Platelets 150 - 450 x10E3/uL 391   354   302     Lab Results  Component Value Date   MCV 88 10/13/2021   MCV 91.2 09/05/2021   MCV 92.3 09/01/2021   No results Wallace for: TSH Lab Results  Component Value Date   HGBA1C 5.4 09/01/2021     BNP No results Wallace for: BNP  ProBNP No results Wallace for: PROBNP   Lipid Panel     Component Value Date/Time   CHOL 123 12/01/2021 1249   TRIG 119 12/01/2021 1249   HDL 44 12/01/2021 1249   CHOLHDL 2.8 12/01/2021 1249   CHOLHDL 3.3 09/01/2021 0404   VLDL 17 09/01/2021 0404   LDLCALC 58 12/01/2021 1249   LABVLDL 21 12/01/2021 1249     RADIOLOGY: No results Wallace.   Additional studies/ records  that were reviewed today include:   I reviewed the patient's catheterization data, records of Dr. Alvester Chou, as well as his [place] sleep evaluation.  09/17/2021 FINDINGS: 1. Mild Obstructive Sleep Apnea with AHI 11.6/hr. 2. No Central Sleep Apnea with pAHIc 1.8/hr. 3. Oxygen desaturations as low as 81%. 4. Minimal snoring was present. O2 sats were < 88% for 0.3 min. 5. Total sleep time was 5 hrs and 37 min. 6. 17.6% of total sleep time was spent in REM sleep. 7. Normal sleep onset latency at 16 min. 8. Shortened REM sleep onset latency at 55 min. 9.  Total awakenings were 12.   DIAGNOSIS: Mild Obstructive Sleep Apnea (G47.33)    ASSESSMENT:    1. Obstructive sleep apnea   2. NSTEMI (non-ST elevated myocardial infarction) ([place])   3. Dyslipidemia, goal LDL below 70   4. Snoring   5. Daytime sleepiness     PLAN:  Kevin Wallace is a 61 year old gentleman who has known CAD and suffered a non-ST segment elevation myocardial infarction on August 30, 2021 and was Wallace to have 99% mid LAD stenosis which was successfully stented.  There was mild concomitant CAD.  He has a history of snoring and had noticed nonrestorative sleep with daytime sleepiness.  He was referred for an NMR sleep study which was suggestive of at least mild overall sleep apnea with an AHI of 11.6.  I do not have any data regarding the severity of sleep apnea during REM sleep.  Minimal snoring was noted and O2 saturation less than 88% was only short duration of 0.3 minutes.  Sleep duration was only 5 hours and 37 minutes and it was shortened rem sleep onset latency at 55 minutes.  I had a long discussion with Kevin Wallace today regarding sleep apnea and its potential adverse cardiovascular consequences if untreated.  He does have significant cardiovascular comorbidity and have recommended initiation of CPAP therapy for more optimal treatment.  Alternatives such as customized oral appliance was discussed.  I will place a prescription for him to receive a new ResMed air sense 11 AutoSet CPAP unit and we will set him at initial pressure range of 7 to 18 cm of water with EPR of 3.  I have suggested he initiate therapy with a ResMed AirFit N30i mask which I believe he will tolerate well.  His blood pressure today is stable.  He is on DAPT with aspirin/Brilinta and is on rosuvastatin 40 mg for hyperlipidemia with target LDL less than 70.  I discussed with him potential supply chain delay and I will see him in 4 -5 months for follow-up evaluation.  Medication Adjustments/Labs and  Tests Ordered: Current medicines are reviewed at length with the patient today.  Concerns regarding medicines are outlined above.  Medication changes, Labs and Tests ordered today are listed in the Patient Instructions below. Patient Instructions  Medication Instructions:   No changes *If you need a refill on your cardiac medications before your next appointment, please call your pharmacy*   Lab Work:  Not needed   Testing/Procedures:  Not needed  Follow-Up: At [facility], you and your health needs are our priority.  As part of our continuing mission to provide you with exceptional heart care, we have created designated Provider Care Teams.  These Care Teams include your primary Cardiologist (physician) and Advanced Practice Providers (APPs -  Physician Assistants and Nurse Practitioners) who all work together to provide you with the care you need, when you need it.  Your next appointment:   5 month(s)  The format for your next appointment:   In Person  Provider:   Dr Shelva Majestic   Other Instructions   pressure changes will done to your  new machine C-PAP  Needs ResAir11 auto  Mask N30i Pressure 7 -18  EPR 3   Signed, Shelva Majestic, MD  01/03/2022 [facility] [address] [place], [place], [place]  [address] Phone: [phone]

## 2021-12-25 ENCOUNTER — Encounter (HOSPITAL_COMMUNITY): Payer: No Typology Code available for payment source

## 2022-01-03 ENCOUNTER — Encounter: Payer: Self-pay | Admitting: Cardiovascular Disease

## 2022-02-11 ENCOUNTER — Encounter: Payer: Self-pay | Admitting: Cardiovascular Disease

## 2022-04-02 ENCOUNTER — Encounter: Payer: Self-pay | Admitting: Cardiovascular Disease

## 2022-04-02 NOTE — Telephone Encounter (Signed)
Patient reported that last night while in shower, he noticed a "knot a bit larger than a quarter" on his upper left shoulder. It is moveable and tender (2/10) and is not hard. He denies injury and said that today, it seems smaller in size. The area is bruised. Recommended to try moist heat for 15 minutes to see if it gets smaller. If it gets bigger, to apply a cold compress. He will contact clinic later today to give update on the "knot". He takes brilinta '90mg'$  twice daily.

## 2022-04-05 ENCOUNTER — Encounter: Payer: Self-pay | Admitting: Cardiovascular Disease

## 2022-04-05 NOTE — Telephone Encounter (Signed)
Called patient to f/u on shoulder knot and bruising. Patient stated the know is not as raised, but bruising has spread. He will send a picture. Made 64-monthf/u with Dr. BGwenlyn Foundfor 10/18.

## 2022-04-19 ENCOUNTER — Encounter (HOSPITAL_COMMUNITY): Payer: Self-pay | Admitting: Emergency Medicine

## 2022-04-19 ENCOUNTER — Emergency Department (HOSPITAL_COMMUNITY): Payer: No Typology Code available for payment source

## 2022-04-19 ENCOUNTER — Observation Stay (HOSPITAL_COMMUNITY)
Admission: EM | Admit: 2022-04-19 | Discharge: 2022-04-21 | Disposition: A | Discharge status: Home / Self Care | Payer: No Typology Code available for payment source | Attending: Internal Medicine | Admitting: Internal Medicine

## 2022-04-19 ENCOUNTER — Other Ambulatory Visit: Payer: Self-pay

## 2022-04-19 ENCOUNTER — Telehealth: Payer: Self-pay | Admitting: Cardiology

## 2022-04-19 DIAGNOSIS — K922 Gastrointestinal hemorrhage, unspecified: Principal | ICD-10-CM

## 2022-04-19 DIAGNOSIS — Z7982 Long term (current) use of aspirin: Secondary | ICD-10-CM | POA: Insufficient documentation

## 2022-04-19 DIAGNOSIS — Z79899 Other long term (current) drug therapy: Secondary | ICD-10-CM | POA: Insufficient documentation

## 2022-04-19 DIAGNOSIS — I10 Essential (primary) hypertension: Secondary | ICD-10-CM | POA: Insufficient documentation

## 2022-04-19 DIAGNOSIS — Z8546 Personal history of malignant neoplasm of prostate: Secondary | ICD-10-CM | POA: Diagnosis not present

## 2022-04-19 DIAGNOSIS — G4733 Obstructive sleep apnea (adult) (pediatric): Secondary | ICD-10-CM | POA: Diagnosis present

## 2022-04-19 DIAGNOSIS — E785 Hyperlipidemia, unspecified: Secondary | ICD-10-CM | POA: Diagnosis present

## 2022-04-19 DIAGNOSIS — K921 Melena: Secondary | ICD-10-CM | POA: Diagnosis present

## 2022-04-19 DIAGNOSIS — Z955 Presence of coronary angioplasty implant and graft: Secondary | ICD-10-CM | POA: Diagnosis not present

## 2022-04-19 DIAGNOSIS — I2510 Atherosclerotic heart disease of native coronary artery without angina pectoris: Secondary | ICD-10-CM | POA: Diagnosis not present

## 2022-04-19 DIAGNOSIS — C61 Malignant neoplasm of prostate: Secondary | ICD-10-CM | POA: Diagnosis present

## 2022-04-19 DIAGNOSIS — K5731 Diverticulosis of large intestine without perforation or abscess with bleeding: Secondary | ICD-10-CM | POA: Diagnosis not present

## 2022-04-19 DIAGNOSIS — I495 Sick sinus syndrome: Secondary | ICD-10-CM

## 2022-04-19 LAB — COMPREHENSIVE METABOLIC PANEL
ALT: 37 U/L (ref 0–44)
AST: 37 U/L (ref 15–41)
Albumin: 3.9 g/dL (ref 3.5–5.0)
Alkaline Phosphatase: 75 U/L (ref 38–126)
Anion gap: 8 (ref 5–15)
BUN: 20 mg/dL (ref 6–20)
CO2: 25 mmol/L (ref 22–32)
Calcium: 9.1 mg/dL (ref 8.9–10.3)
Chloride: 104 mmol/L (ref 98–111)
Creatinine, Ser: 1.07 mg/dL (ref 0.61–1.24)
GFR, Estimated: >60 mL/min (ref >60)
Glucose, Bld: 111 mg/dL — ABNORMAL HIGH (ref 70–99)
Potassium: 3.9 mmol/L (ref 3.5–5.1)
Sodium: 137 mmol/L (ref 135–145)
Total Bilirubin: 0.6 mg/dL (ref 0.3–1.2)
Total Protein: 6.9 g/dL (ref 6.5–8.1)

## 2022-04-19 LAB — CBC
HCT: 47 % (ref 39.0–52.0)
HCT: 47 % (ref 39.0–52.0)
Hemoglobin: 15.6 g/dL (ref 13.0–17.0)
Hemoglobin: 15.8 g/dL (ref 13.0–17.0)
MCH: 30.8 pg (ref 26.0–34.0)
MCH: 31.1 pg (ref 26.0–34.0)
MCHC: 33.2 g/dL (ref 30.0–36.0)
MCHC: 33.6 g/dL (ref 30.0–36.0)
MCV: 92.9 fL (ref 80.0–100.0)
MCV: 92.5 fL (ref 80.0–100.0)
Platelets: 296 10*3/uL (ref 150–400)
Platelets: 300 10*3/uL (ref 150–400)
RBC: 5.06 MIL/uL (ref 4.22–5.81)
RBC: 5.08 MIL/uL (ref 4.22–5.81)
RDW: 13 % (ref 11.5–15.5)
RDW: 12.8 % (ref 11.5–15.5)
WBC: 7.3 10*3/uL (ref 4.0–10.5)
WBC: 7.1 10*3/uL (ref 4.0–10.5)
nRBC: 0 % (ref 0.0–0.2)
nRBC: 0 % (ref 0.0–0.2)

## 2022-04-19 LAB — TYPE AND SCREEN
ABO/RH(D): A POS
Antibody Screen: NEGATIVE

## 2022-04-19 LAB — POC OCCULT BLOOD, ED: Fecal Occult Bld: POSITIVE — AB

## 2022-04-19 MED ORDER — ONDANSETRON HCL 4 MG/2ML IJ SOLN: 4.0000 mg | Freq: Four times a day (QID) | INTRAMUSCULAR | Status: DC | PRN

## 2022-04-19 MED ORDER — ACETAMINOPHEN 650 MG RE SUPP: 650.0000 mg | Freq: Four times a day (QID) | RECTAL | Status: DC | PRN

## 2022-04-19 MED ORDER — HYDRALAZINE HCL 20 MG/ML IJ SOLN: 5.0000 mg | INTRAMUSCULAR | Status: DC | PRN

## 2022-04-19 MED ORDER — ALLOPURINOL 300 MG PO TABS
300.0000 mg | ORAL_TABLET | Freq: Every day | ORAL | Status: DC
  Administered 2022-04-20 – 2022-04-21 (×2): 300 mg via ORAL
  Filled 2022-04-19: qty 1
  Filled 2022-04-19: qty 3

## 2022-04-19 MED ORDER — LACTATED RINGERS IV SOLN: INTRAVENOUS | Status: DC

## 2022-04-19 MED ORDER — ROSUVASTATIN CALCIUM 20 MG PO TABS
40.0000 mg | ORAL_TABLET | Freq: Every day | ORAL | Status: DC
  Administered 2022-04-19 – 2022-04-20 (×2): 40 mg via ORAL
  Filled 2022-04-19 (×2): qty 2

## 2022-04-19 MED ORDER — ACETAMINOPHEN 325 MG PO TABS: 650.0000 mg | ORAL_TABLET | Freq: Four times a day (QID) | ORAL | Status: DC | PRN

## 2022-04-19 MED ORDER — IOHEXOL 350 MG/ML SOLN
100.0000 mL | Freq: Once | INTRAVENOUS | Status: AC | PRN
  Administered 2022-04-19: 100 mL via INTRAVENOUS

## 2022-04-19 MED ORDER — ONDANSETRON HCL 4 MG PO TABS: 4.0000 mg | ORAL_TABLET | Freq: Four times a day (QID) | ORAL | Status: DC | PRN

## 2022-04-19 NOTE — ED Notes (Signed)
Pt ambulatory to BR without difficulty.

## 2022-04-19 NOTE — ED Provider Notes (Signed)
[facility] EMERGENCY DEPARTMENT Provider Note   CSN: [id number] Arrival date & time: 04/19/22  3614     History  Chief Complaint  Patient presents with   Blood In Stools    Kevin Wallace is a 61 y.o. male.  HPI Patient presents for blood in stool.  Medical history includes CAD s/p LAD stent in January, HTN, HLD, OSA, gout, prostate cancer.  He notes a small amount of blood in stool last night.  He had a large amount of bright red stool with clots present this morning.  He is on aspirin and Brilinta since his stent in January and has experienced frequent bruising since either of these medications.  In February, he had transient episode of blood in stool.  He denies any other previous episodes.  Patient denies any associated rectal pain.  He denies any shortness of breath, dizziness, or lightheadedness.    Home Medications Prior to Admission medications   Medication Sig Start Date End Date Taking? Authorizing Provider  allopurinol (ZYLOPRIM) 300 MG tablet Take 300 mg by mouth daily. 11/28/19  Yes [provider]  aspirin 81 MG EC tablet Take 1 tablet (81 mg total) by mouth daily. Swallow whole. 09/02/21  Yes Bhagat, Bhavinkumar, PA  nitroGLYCERIN (NITROSTAT) 0.4 MG SL tablet Place 1 tablet (0.4 mg total) under the tongue every 5 (five) minutes x 3 doses as needed for chest pain. 09/01/21  Yes Bhagat, Bhavinkumar, PA  rosuvastatin (CRESTOR) 40 MG tablet Take 1 tablet (40 mg total) by mouth daily. Patient taking differently: Take 40 mg by mouth at bedtime. 09/01/21  Yes Bhagat, Bhavinkumar, PA  ticagrelor (BRILINTA) 90 MG TABS tablet Take 1 tablet (90 mg total) by mouth 2 (two) times daily. 12/07/21  Yes Lorretta Harp, MD      Allergies    Colchicine    Review of Systems   Review of Systems  Gastrointestinal:  Positive for blood in stool.  All other systems reviewed and are negative.   Physical Exam Updated Vital Signs BP 121/78 (BP Location: Right  Arm)   Pulse (!) 50   Temp 98.5 F (36.9 C) (Oral)   Resp 16   SpO2 97%  Physical Exam Vitals and nursing note reviewed.  Constitutional:      General: He is not in acute distress.    Appearance: Normal appearance. He is well-developed. He is not ill-appearing, toxic-appearing or diaphoretic.  HENT:     Head: Normocephalic and atraumatic.     Right Ear: External ear normal.     Left Ear: External ear normal.     Nose: Nose normal.     Mouth/Throat:     Mouth: Mucous membranes are moist.     Pharynx: Oropharynx is clear.  Eyes:     Extraocular Movements: Extraocular movements intact.     Conjunctiva/sclera: Conjunctivae normal.  Cardiovascular:     Rate and Rhythm: Normal rate and regular rhythm.  Pulmonary:     Effort: Pulmonary effort is normal. No respiratory distress.  Abdominal:     General: There is no distension.     Palpations: Abdomen is soft.     Tenderness: There is no abdominal tenderness.  Musculoskeletal:        General: No swelling. Normal range of motion.     Cervical back: Normal range of motion and neck supple.     Right lower leg: No edema.     Left lower leg: No edema.  Skin:  General: Skin is warm and dry.     Coloration: Skin is not jaundiced or pale.  Neurological:     General: No focal deficit present.     Mental Status: He is alert and oriented to person, place, and time.     Cranial Nerves: No cranial nerve deficit.     Sensory: No sensory deficit.     Motor: No weakness.     Coordination: Coordination normal.  Psychiatric:        Mood and Affect: Mood normal.        Behavior: Behavior normal.        Thought Content: Thought content normal.        Judgment: Judgment normal.     ED Results / Procedures / Treatments   Labs (all labs ordered are listed, but only abnormal results are displayed) Labs Reviewed  COMPREHENSIVE METABOLIC PANEL - Abnormal; Notable for the following components:      Result Value   Glucose, Bld 111 (*)    All  other components within normal limits  POC OCCULT BLOOD, ED - Abnormal; Notable for the following components:   Fecal Occult Bld POSITIVE (*)    All other components within normal limits  CBC  CBC  CBC  CBC  TYPE AND SCREEN    EKG None  Radiology CT ANGIO GI BLEED  Result Date: 04/19/2022 CLINICAL DATA:  Bloody stool. EXAM: CTA ABDOMEN AND PELVIS WITHOUT AND WITH CONTRAST TECHNIQUE: Multidetector CT imaging of the abdomen and pelvis was performed using the standard protocol during bolus administration of intravenous contrast. Multiplanar reconstructed images and MIPs were obtained and reviewed to evaluate the vascular anatomy. RADIATION DOSE REDUCTION: This exam was performed according to the departmental dose-optimization program which includes automated exposure control, adjustment of the mA and/or kV according to patient size and/or use of iterative reconstruction technique. CONTRAST:  136m OMNIPAQUE IOHEXOL 350 MG/ML SOLN COMPARISON:  None Available. FINDINGS: VASCULAR Aorta: Atherosclerosis of thoracic aorta is noted without aneurysm formation. There appears to be a large atherosclerotic ulcer in the infrarenal abdominal aorta. Celiac: Patent without evidence of aneurysm, dissection, vasculitis or significant stenosis. SMA: Severe focal stenosis is noted in the proximal portion without thrombus. Renals: Both renal arteries are patent without evidence of aneurysm, dissection, vasculitis, fibromuscular dysplasia or significant stenosis. IMA: Patent without evidence of aneurysm, dissection, vasculitis or significant stenosis. Inflow: Patent without evidence of aneurysm, dissection, vasculitis or significant stenosis. Proximal Outflow: Bilateral common femoral and visualized portions of the superficial and profunda femoral arteries are patent without evidence of aneurysm, dissection, vasculitis or significant stenosis. Veins: No obvious venous abnormality within the limitations of this arterial  phase study. Review of the MIP images confirms the above findings. NON-VASCULAR Lower chest: No acute abnormality. Hepatobiliary: No focal liver abnormality is seen. No gallstones, gallbladder wall thickening, or biliary dilatation. Pancreas: Unremarkable. No pancreatic ductal dilatation or surrounding inflammatory changes. Spleen: Normal in size without focal abnormality. Adrenals/Urinary Tract: Adrenal glands appear normal. No hydronephrosis or renal obstruction is noted. No renal or ureteral calculi are noted. Urinary bladder is unremarkable. Stomach/Bowel: The stomach and appendix are unremarkable. There is no evidence of bowel obstruction or inflammation. Stool is noted throughout the colon. Diverticulosis of descending and sigmoid colon is noted. There is noted active contrast extravasation in the proximal sigmoid colon concerning for active gastrointestinal hemorrhage. This is best seen on image number 67 of series 16. Lymphatic: No adenopathy is noted. Reproductive: Status post prostatectomy. Other: No abdominal wall  hernia or abnormality. No abdominopelvic ascites. Musculoskeletal: No acute or significant osseous findings. IMPRESSION: VASCULAR There is noted active contrast extravasation in the proximal sigmoid colon concerning for active gastrointestinal hemorrhage. Critical Value/emergent results were called by telephone at the time of interpretation on 04/19/2022 at 1:06 pm to provider Godfrey Pick , who verbally acknowledged these results. Atherosclerosis of infrarenal abdominal aorta is noted with probable large atherosclerotic ulcer. Severe focal stenosis is seen involving proximal portion of superior mesenteric artery. NON-VASCULAR Extensive diverticulosis is seen involving the descending and sigmoid colon, with stool noted throughout the colon. Aortic Atherosclerosis (ICD10-I70.0). Electronically Signed   By: Marijo Conception M.D.   On: 04/19/2022 13:06    Procedures Procedures    Medications  Ordered in ED Medications  allopurinol (ZYLOPRIM) tablet 300 mg (has no administration in time range)  rosuvastatin (CRESTOR) tablet 40 mg (has no administration in time range)  lactated ringers infusion ( Intravenous New Bag/Given 04/19/22 1624)  acetaminophen (TYLENOL) tablet 650 mg (has no administration in time range)    Or  acetaminophen (TYLENOL) suppository 650 mg (has no administration in time range)  ondansetron (ZOFRAN) tablet 4 mg (has no administration in time range)    Or  ondansetron (ZOFRAN) injection 4 mg (has no administration in time range)  hydrALAZINE (APRESOLINE) injection 5 mg (has no administration in time range)  iohexol (OMNIPAQUE) 350 MG/ML injection 100 mL (100 mLs Intravenous Contrast Given 04/19/22 1229)    ED Course/ Medical Decision Making/ A&P                           Medical Decision Making Amount and/or Complexity of Data Reviewed Labs: ordered. Radiology: ordered.  Risk Prescription drug management. Decision regarding hospitalization.   This patient presents to the ED for concern of rectal bleeding, this involves an extensive number of treatment options, and is a complaint that carries with it a high risk of complications and morbidity.  The differential diagnosis includes internal hemorrhoids, diverticular bleed, neoplasm, upper GI bleed with rapid transit   Co morbidities that complicate the patient evaluation  CAD s/p LAD stent in January, HTN, HLD, OSA, gout, prostate cancer   Additional history obtained:  Additional history obtained from patient's wife External records from outside source obtained and reviewed including EMR   Lab Tests:  I Ordered, and personally interpreted labs.  The pertinent results include: Normal hemoglobin that was stable on repeat.  No leukocytosis, normal electrolytes, normal kidney function.   Imaging Studies ordered:  I ordered imaging studies including CTA abdomen and pelvis I independently visualized  and interpreted imaging which showed small active (likely) diverticular bleed of sigmoid colon I agree with the radiologist interpretation   Cardiac Monitoring: / EKG:  The patient was maintained on a cardiac monitor.  I personally viewed and interpreted the cardiac monitored which showed an underlying rhythm of: Sinus rhythm   Consultations Obtained:  I requested consultation with the interventional radiologist,  and discussed lab and imaging findings as well as pertinent plan - they recommend: Aggressive reversal of Brilinta I requested consultation with the cardiologist, Dr. Margaretann Loveless,  and discussed lab and imaging findings as well as pertinent plan - they recommend: Not reversing Brilinta.  They will see the patient and provide further recommendations on antiplatelet medications I requested consultation with the gastroenterologist, Dr. Candis Schatz,  and discussed lab and imaging findings as well as pertinent plan - they recommend: Admission to medicine.  [facility]  will follow in consult.   Problem List / ED Course / Critical interventions / Medication management  Patient is a 61 year old male presenting for painless rectal bleeding.  He has small amount of blood in his toilet last night and had a greater amount today.  He describes the blood as bright red with some presence of clots in the stool.  He has not had any other associated symptoms.  On arrival in the ED, he is well-appearing with normal vital signs.  While in the ED, patient had bowel movement, during which bright red blood was again seen.  He states that the amount was slightly less than at home this morning.  On DRE, a small amount of gross blood is present.  Laboratory work-up shows normal hemoglobin, no leukocytosis, normal kidney function, normal electrolytes.  Patient underwent CTA of abdomen and pelvis.  This scan did show a small active bleed that is at the level of sigmoid colon and is likely diverticular bleed.  While in the ED,  patient had approximately 5 further episodes of bloody bowel movements.  He remained hemodynamically stable.  Repeat CBC showed no change in hemoglobin.  Given the active bleed, I did consult with interventional radiology.  They recommended medical management with reversal of his antiplatelet therapies.  I then discussed this with cardiology, given his LAD stent 9 months ago.  Cardiology advises against antiplatelet reversal.  They will see the patient in consult and provide further recommendations.  I spoke with gastroenterology, who will follow the patient in consult.  Patient was admitted to medicine for further management.   Social Determinants of Health:  Has access to outpatient care  CRITICAL CARE Performed by: Godfrey Pick   Total critical care time: 32 minutes  Critical care time was exclusive of separately billable procedures and treating other patients.  Critical care was necessary to treat or prevent imminent or life-threatening deterioration.  Critical care was time spent personally by me on the following activities: development of treatment plan with patient and/or surrogate as well as nursing, discussions with consultants, evaluation of patient's response to treatment, examination of patient, obtaining history from patient or surrogate, ordering and performing treatments and interventions, ordering and review of laboratory studies, ordering and review of radiographic studies, pulse oximetry and re-evaluation of patient's condition.         Final Clinical Impression(s) / ED Diagnoses Final diagnoses:  Lower GI bleed    Rx / DC Orders ED Discharge Orders     None         Godfrey Pick, MD 04/19/22 1652

## 2022-04-19 NOTE — H&P (Signed)
History and Physical    Patient: Kevin Wallace WCB:[id number] DOB: 01/06/1961 DOA: 04/19/2022 DOS: the patient was seen and examined on 04/19/2022 PCP: Ginger Organ., MD  Patient coming from: Home - lives with wife and 2 children; NOK: Wife, [phone]   Chief Complaint: Hematochezia  HPI: Harlon Kutner is a 61 y.o. male with medical history significant of CAD s/p stent in 08/2021, HLD, OSA on CPAP, and prostate CA presenting with hematochezia.  He reports that last night he had a bit of blood in his stool.  This AM, it was a lot more.  He called cards since he had a stent in January and was on Brilinta.  He was told to come in.  He has had 3-4 further BRBPR episodes since arrival.  He had diverticulitis in the past.  In February, he had mild rectal bleeding and it stopped spontaneously.  No pain.  Not light-headed/dizzy/SOB.      ER Course:  GI bleed, BRBPR on Brilinta, recommended to reverse.  Cardiology, GI both following.  Active diverticular bleed, IR consulted and recommends aggressive reversal of anti-platelet medication.  Cardiology does NOT recommend reversal of Brilinta.  Cardiology will see.  GI will see, maybe tomorrow.  Hgb stable.     Review of Systems: As mentioned in the history of present illness. All other systems reviewed and are negative. Past Medical History:  Diagnosis Date   Allergy    CAD (coronary artery disease)    s/p DES to mLAD 08/31/21   Cancer ([place]) 11/2019   s/p prostatectomy   Complication of anesthesia    took a long time to wear off    Gout    High cholesterol    Crestor   Past Surgical History:  Procedure Laterality Date   ANKLE ARTHROSCOPY     "had it cleaned out"   CARDIAC CATHETERIZATION     COLONOSCOPY  10/14/2014   KNEE ARTHROSCOPY  07/2019   KNEE ARTHROSCOPY  1980s   LEFT HEART CATH AND CORONARY ANGIOGRAPHY N/A 08/31/2021   Procedure: LEFT HEART CATH AND CORONARY ANGIOGRAPHY;  Surgeon: Burnell Blanks, MD;  Location:  [facility];  Service: Cardiovascular;  Laterality: N/A;   LYMPHADENECTOMY Bilateral 02/28/2020   Procedure: LYMPHADENECTOMY, PELVIC;  Surgeon: Raynelle Bring, MD;  Location: [facility];  Service: Urology;  Laterality: Bilateral;   ROBOT ASSISTED LAPAROSCOPIC RADICAL PROSTATECTOMY N/A 02/28/2020   Procedure: XI ROBOTIC ASSISTED LAPAROSCOPIC RADICAL PROSTATECTOMY LEVEL 2;  Surgeon: Raynelle Bring, MD;  Location: [facility];  Service: Urology;  Laterality: N/A;   Social History:  reports that he has never smoked. He has never used smokeless tobacco. He reports that he does not currently use alcohol. He reports that he does not use drugs.  Allergies  Allergen Reactions   Colchicine Diarrhea and Nausea And Vomiting    Family History  Problem Relation Age of Onset   Rheumatic fever Father 30       Died from complications of RF but might have been a heart attack   Colon cancer Neg Hx    Rectal cancer Neg Hx    Stomach cancer Neg Hx    Esophageal cancer Neg Hx     Prior to Admission medications   Medication Sig Start Date End Date Taking? Authorizing Provider  allopurinol (ZYLOPRIM) 300 MG tablet Take 300 mg by mouth daily. 11/28/19   [provider]  aspirin 81 MG EC tablet Take 1 tablet (81 mg total) by mouth daily. Swallow  whole. 09/02/21   Bhagat, Crista Luria, PA  nitroGLYCERIN (NITROSTAT) 0.4 MG SL tablet Place 1 tablet (0.4 mg total) under the tongue every 5 (five) minutes x 3 doses as needed for chest pain. 09/01/21   Bhagat, Crista Luria, PA  rosuvastatin (CRESTOR) 40 MG tablet Take 1 tablet (40 mg total) by mouth daily. Patient taking differently: Take 40 mg by mouth at bedtime. 09/01/21   [name], PA  ticagrelor (BRILINTA) 90 MG TABS tablet Take 1 tablet (90 mg total) by mouth 2 (two) times daily. 12/07/21   Lorretta Harp, MD    Physical Exam: Vitals:   04/19/22 1300 04/19/22 1348 04/19/22 1624 04/19/22 1708  BP: 122/76 121/78  129/73  Pulse: (!) 48 (!) 50   (!) 52  Resp: '19 16  19  '$ Temp:  97.9 F (36.6 C) 98.5 F (36.9 C)   TempSrc:  Oral Oral   SpO2: 96% 97%  94%   General:  Appears calm and comfortable and is in NAD Eyes:  PERRL, EOMI, normal lids, iris ENT:  grossly normal hearing, lips & tongue, mmm; appropriate dentition Neck:  no LAD, masses or thyromegaly Cardiovascular:  RRR, no m/r/g. No LE edema.  Respiratory:   CTA bilaterally with no wheezes/rales/rhonchi.  Normal respiratory effort. Abdomen:  soft, NT, ND, NABS (less active along L abdomen) Skin:  no rash or induration seen on limited exam Musculoskeletal:  grossly normal tone BUE/BLE, good ROM, no bony abnormality Psychiatric:  grossly normal mood and affect, speech fluent and appropriate, AOx3 Neurologic:  CN 2-12 grossly intact, moves all extremities in coordinated fashion  Radiological Exams on Admission: Independently reviewed - see discussion in A/P where applicable  CT ANGIO GI BLEED  Result Date: 04/19/2022 CLINICAL DATA:  Bloody stool. EXAM: CTA ABDOMEN AND PELVIS WITHOUT AND WITH CONTRAST TECHNIQUE: Multidetector CT imaging of the abdomen and pelvis was performed using the standard protocol during bolus administration of intravenous contrast. Multiplanar reconstructed images and MIPs were obtained and reviewed to evaluate the vascular anatomy. RADIATION DOSE REDUCTION: This exam was performed according to the departmental dose-optimization program which includes automated exposure control, adjustment of the mA and/or kV according to patient size and/or use of iterative reconstruction technique. CONTRAST:  129m OMNIPAQUE IOHEXOL 350 MG/ML SOLN COMPARISON:  None Available. FINDINGS: VASCULAR Aorta: Atherosclerosis of thoracic aorta is noted without aneurysm formation. There appears to be a large atherosclerotic ulcer in the infrarenal abdominal aorta. Celiac: Patent without evidence of aneurysm, dissection, vasculitis or significant stenosis. SMA: Severe focal stenosis is  noted in the proximal portion without thrombus. Renals: Both renal arteries are patent without evidence of aneurysm, dissection, vasculitis, fibromuscular dysplasia or significant stenosis. IMA: Patent without evidence of aneurysm, dissection, vasculitis or significant stenosis. Inflow: Patent without evidence of aneurysm, dissection, vasculitis or significant stenosis. Proximal Outflow: Bilateral common femoral and visualized portions of the superficial and profunda femoral arteries are patent without evidence of aneurysm, dissection, vasculitis or significant stenosis. Veins: No obvious venous abnormality within the limitations of this arterial phase study. Review of the MIP images confirms the above findings. NON-VASCULAR Lower chest: No acute abnormality. Hepatobiliary: No focal liver abnormality is seen. No gallstones, gallbladder wall thickening, or biliary dilatation. Pancreas: Unremarkable. No pancreatic ductal dilatation or surrounding inflammatory changes. Spleen: Normal in size without focal abnormality. Adrenals/Urinary Tract: Adrenal glands appear normal. No hydronephrosis or renal obstruction is noted. No renal or ureteral calculi are noted. Urinary bladder is unremarkable. Stomach/Bowel: The stomach and appendix are unremarkable. There is no  evidence of bowel obstruction or inflammation. Stool is noted throughout the colon. Diverticulosis of descending and sigmoid colon is noted. There is noted active contrast extravasation in the proximal sigmoid colon concerning for active gastrointestinal hemorrhage. This is best seen on image number 67 of series 16. Lymphatic: No adenopathy is noted. Reproductive: Status post prostatectomy. Other: No abdominal wall hernia or abnormality. No abdominopelvic ascites. Musculoskeletal: No acute or significant osseous findings. IMPRESSION: VASCULAR There is noted active contrast extravasation in the proximal sigmoid colon concerning for active gastrointestinal  hemorrhage. Critical Value/emergent results were called by telephone at the time of interpretation on 04/19/2022 at 1:06 pm to provider Godfrey Pick , who verbally acknowledged these results. Atherosclerosis of infrarenal abdominal aorta is noted with probable large atherosclerotic ulcer. Severe focal stenosis is seen involving proximal portion of superior mesenteric artery. NON-VASCULAR Extensive diverticulosis is seen involving the descending and sigmoid colon, with stool noted throughout the colon. Aortic Atherosclerosis (ICD10-I70.0). Electronically Signed   By: Marijo Conception M.D.   On: 04/19/2022 13:06    EKG: Independently reviewed.  Sinus bradycardia with rate 47; no evidence of acute ischemia   Labs on Admission: I have personally reviewed the available labs and imaging studies at the time of the admission.  Pertinent labs:    Glucose 111 Normal CBC x 2 Heme positive   Assessment and Plan: Principal Problem:   Acute lower GI bleeding Active Problems:   Prostate cancer ([place])   Hyperlipidemia   Obstructive sleep apnea   Coronary artery disease    Lower GI bleeding -Patient with acute onset of lower GI bleeding this AM -He is continuing to have recurrent bloody BMs but is hemodynamically stable at this time and with normal (starting) Hgb -He is afebrile at this time without tachycardia (actually bradycardia), and no leukocytosis; will not give antibiotics at this time.  -Will place in observation  -Continue to monitor for recurrent bleeding -CTA was done and appears to show an active area of diverticular hemorrhage -GI consult has been requested -IR was consulted for embolization and declined, recommending "acute, aggressive AC reversal" -Cardiology was consulted and suggests holding ASA and Brilinta for 24 hours and continuing to monitor -Both strategies appear reasonable at this time, but they might also need to be revisited should his clinical stability decline -Monitor  closely and follow cbc q6h, transfuse as necessary for Hbg <8 with h/o cardiac disease  CAD -s/p stent in 08/2021 -Currently on Brilinta, ASA - will hold both -Mild bradycardia, will monitor on telemetry but he is also asymptomatic from this standpoint  HLD -Continue Crestor  OSA  -Continue CPAP  Prostate CA  -s/p prostatectomy -Stable at this time    Advance Care Planning:   Code Status: Full Code   Consults: GI; IR; Cardiology  DVT Prophylaxis: SCDs  Family Communication: Wife and daughter were present throughout evaluation  Severity of Illness: The appropriate patient status for this patient is OBSERVATION. Observation status is judged to be reasonable and necessary in order to provide the required intensity of service to ensure the patient's safety. The patient's presenting symptoms, physical exam findings, and initial radiographic and laboratory data in the context of their medical condition is felt to place them at decreased risk for further clinical deterioration. Furthermore, it is anticipated that the patient will be medically stable for discharge from the hospital within 2 midnights of admission.   Author: Karmen Bongo, MD 04/19/2022 6:33 PM  For on call review [email or web address].

## 2022-04-19 NOTE — Progress Notes (Signed)
Pt said he will let RT know if he wants to try his CPAP mask later to sleep, he is fine without it at this time. RT has CPAP machine at bedside.

## 2022-04-19 NOTE — ED Triage Notes (Signed)
Patient arrives from home after noticing this morning when having a bowel movement that he had bright red blood along with clots. Pt states he also noticed a small amount last night but when using the restroom this morning it had worsened. Patient is on brillenta and takes an 81 mg ASA daily post stent placement in January. Patient consulted cardiologist who advised him to come here for to have hbg checked. Denies chest pain, SHOB, dizziness. Patient states he did have to strain for a bowel movement yesterday but not aware of any hemmorhoids.

## 2022-04-19 NOTE — Consult Note (Addendum)
Vascular and Interventional Radiology  On Call Phone Note  Patient: Kevin Wallace DOB: 1960/11/23 Medical Record Number: [id number] Note Date/Time: 04/19/22 1:45 PM   Consulting Diagnosis: LGIB with active extravasation  Assessment : 61 y.o. year old male comorbid including CAD s/p NSTEMI in 08/2021 and stent placement. Pt on DAPT w ASA + ticagrelor and presented to [facility] ER w BRBPR. CTA revealing severe burden of sigmoid diverticulosis with focal extravasation at a diverticulum.  He remains VSS / HDS  ER MD reached out to VIR On Call Physician to evaluate for potential intervention.  Imaging independently reviewed, demonstrating severe diverticulosis, focal active extravasation at sigmoid.    Plan: *No acute VIR intervention at this time. *Recommend acute, aggressive AC reversal. Consider [facility] team for reversal recommendations. *Reach out to VIR if recalcitrant bleeding s/p reversal and/or hemodynamic instability.    The patient was physically located in [place] or a state in which I am permitted to provide care. The encounter was reasonable and appropriate under the circumstances given the patient's presentation at the time.   Michaelle Birks, MD [facility]   Pager. [place]

## 2022-04-19 NOTE — Consult Note (Addendum)
Cardiology Consultation   Patient ID: Kevin Wallace MRN: [id number]; DOB: 11-20-1960  Admit date: 04/19/2022 Date of Consult: 04/19/2022  PCP:  Ginger Organ., MD   Kevin Wallace Providers Cardiologist:  Quay Burow, MD        Patient Profile:   Kevin Wallace is a 61 y.o. male with a hx of CAD with NSTEMI 08/2021 with stent to mLAD other nonobstructive CAD treated medically and stable EF, HTN, HLD and hx prostate cancer who is being seen 04/19/2022 for the evaluation of  antiplatelet recommendation with acute GI bleed at the request of Dr. Doren Custard.   History of Present Illness:   Kevin Wallace  with above hx and stent 08/31/2021 on ASA and brilinta, now presents with GI bleed and Hgb 15.8.  Stools heme +  he had  CT angio GI with VASCULAR  There is noted active contrast extravasation in the proximal sigmoid colon concerning for active gastrointestinal hemorrhage. Critical Value/emergent results were called by telephone at the time of interpretation on 04/19/2022 at 1:06 pm to provider Godfrey Pick , who verbally acknowledged these results.   Atherosclerosis of infrarenal abdominal aorta is noted with probable large atherosclerotic ulcer.   Severe focal stenosis is seen involving proximal portion of superior mesenteric artery.   Extensive diverticulosis is seen involving the descending and sigmoid colon, with stool noted throughout the colon.   EKG:  The EKG was personally reviewed and demonstrates:  SB at 47 and no ST changes, HR was slow in May on EKG as well. Telemetry:  Telemetry was personally reviewed and demonstrates:  SB  Na 137, K+ 3.9 BUN 20 Cr 1.07 Hgb 15.8, WBC 7.1 plts 300   No chest pain and one day a touch of SOB but very brief.  Has bruises all over from birinta.  Last dose last night and last dose ASA yesterday.     Past Medical History:  Diagnosis Date   Allergy    CAD (coronary artery disease)    s/p DES to mLAD 08/31/21   Cancer ([place]) 11/2019    Prostate Cancer -recently dx -surgery scheduled 04/13/92   Complication of anesthesia    took a long time to wear off    Gout    High cholesterol    diet controlled, no meds    Past Surgical History:  Procedure Laterality Date   ANKLE ARTHROSCOPY     "had it cleaned out"   CARDIAC CATHETERIZATION     COLONOSCOPY  10/14/2014   KNEE ARTHROSCOPY  07/2019   KNEE ARTHROSCOPY  1980s   LEFT HEART CATH AND CORONARY ANGIOGRAPHY N/A 08/31/2021   Procedure: LEFT HEART CATH AND CORONARY ANGIOGRAPHY;  Surgeon: Burnell Blanks, MD;  Location: [facility];  Service: Cardiovascular;  Laterality: N/A;   LYMPHADENECTOMY Bilateral 02/28/2020   Procedure: LYMPHADENECTOMY, PELVIC;  Surgeon: Raynelle Bring, MD;  Location: [facility];  Service: Urology;  Laterality: Bilateral;   ROBOT ASSISTED LAPAROSCOPIC RADICAL PROSTATECTOMY N/A 02/28/2020   Procedure: XI ROBOTIC ASSISTED LAPAROSCOPIC RADICAL PROSTATECTOMY LEVEL 2;  Surgeon: Raynelle Bring, MD;  Location: [facility];  Service: Urology;  Laterality: N/A;     Home Medications:  Prior to Admission medications   Medication Sig Start Date End Date Taking? Authorizing Provider  allopurinol (ZYLOPRIM) 300 MG tablet Take 300 mg by mouth daily. 11/28/19   [provider]  aspirin 81 MG EC tablet Take 1 tablet (81 mg total) by mouth daily. Swallow whole. 09/02/21   Bhagat,  Bhavinkumar, PA  nitroGLYCERIN (NITROSTAT) 0.4 MG SL tablet Place 1 tablet (0.4 mg total) under the tongue every 5 (five) minutes x 3 doses as needed for chest pain. 09/01/21   Bhagat, Crista Luria, PA  rosuvastatin (CRESTOR) 40 MG tablet Take 1 tablet (40 mg total) by mouth daily. Patient taking differently: Take 40 mg by mouth at bedtime. 09/01/21   [name], PA  ticagrelor (BRILINTA) 90 MG TABS tablet Take 1 tablet (90 mg total) by mouth 2 (two) times daily. 12/07/21   Lorretta Harp, MD    Inpatient Medications: Scheduled Meds:  Continuous Infusions:  PRN  Meds:   Allergies:    Allergies  Allergen Reactions   Colchicine Diarrhea and Nausea And Vomiting    Social History:   Social History   Socioeconomic History   Marital status: Married    Spouse name: Kevin Wallace   Number of children: 2   Years of education: 18   Highest education level: Master's degree (e.g., MA, MS, MEng, MEd, MSW, MBA)  Occupational History   Not on file  Tobacco Use   Smoking status: Never   Smokeless tobacco: Never  Vaping Use   Vaping Use: Never used  Substance and Sexual Activity   Alcohol use: No    Alcohol/week: 0.0 standard drinks of alcohol   Drug use: No   Sexual activity: Yes    Birth control/protection: None  Other Topics Concern   Not on file  Social History Narrative   Lives with wife.     Social Determinants of Health   Financial Resource Strain: Not on file  Food Insecurity: Not on file  Transportation Needs: Not on file  Physical Activity: Not on file  Stress: Not on file  Social Connections: Not on file  Intimate Partner Violence: Not on file    Family History:    Family History  Problem Relation Age of Onset   Rheumatic fever Father 22       Died from complications of RF but might have been a heart attack   Colon cancer Neg Hx    Rectal cancer Neg Hx    Stomach cancer Neg Hx    Esophageal cancer Neg Hx      ROS:  Please see the history of present illness.  General:no colds or fevers, no weight changes Skin:no rashes or ulcers HEENT:no blurred vision, no congestion CV:see HPI PUL:see HPI GI:no diarrhea constipation + melena, no indigestion GU:no hematuria, no dysuria MS:no joint pain, no claudication Neuro:no syncope, no lightheadedness Endo:no diabetes, no thyroid disease  All other ROS reviewed and negative.     Physical Exam/Data:   Vitals:   04/19/22 1100 04/19/22 1230 04/19/22 1300 04/19/22 1348  BP: 110/65 123/76 122/76 121/78  Pulse: (!) 50 (!) 50 (!) 48 (!) 50  Resp:   19 16  Temp:    97.9 F (36.6  C)  TempSrc:    Oral  SpO2: 94% 96% 96% 97%   No intake or output data in the 24 hours ending 04/19/22 1408    12/24/2021    3:34 PM 12/23/2021    4:50 PM 12/01/2021   11:21 AM  Last 3 Weights  Weight (lbs) 191 lb 6.4 oz 197 lb 5 oz 193 lb 9.6 oz  Weight (kg) 86.818 kg 89.5 kg 87.816 kg     There is no height or weight on file to calculate BMI.  General:  Well nourished, well developed, in no acute distress HEENT: normal Neck: no JVD  Vascular: No carotid bruits; Distal pulses 2+ bilaterally Cardiac:  normal S1, S2; RRR; no murmur gallup rub or click Lungs:  clear to auscultation bilaterally, no wheezing, rhonchi or rales  Abd: soft, nontender, no hepatomegaly  Ext: no edema Musculoskeletal:  No deformities, BUE and BLE strength normal and equal Skin: warm and dry  Neuro:  CNs 2-12 intact, no focal abnormalities noted Psych:  Normal affect   Relevant CV Studies:  Cardiac cath 08/31/21   Prox RCA lesion is 20% stenosed.   Mid RCA lesion is 20% stenosed.   Ost Cx to Prox Cx lesion is 30% stenosed.   1st Mrg lesion is 30% stenosed.   2nd Diag lesion is 50% stenosed.   Mid LAD lesion is 99% stenosed.   A drug-eluting stent was successfully placed using a SYNERGY XD 2.50X32.   Post intervention, there is a 0% residual stenosis.   The left ventricular systolic function is normal.   LV end diastolic pressure is normal.   The left ventricular ejection fraction is 50-55% by visual estimate.   There is no mitral valve regurgitation.   Severe mid LAD stenosis just beyond a moderate caliber diagonal branch. The diagonal branch has moderate ostial stenosis.  Successful PTCA/DES x 1 mid LAD Mild proximal Circumflex stenosis Large dominant RCA with mild proximal and mild distal stenosis Preserved LV systolic function with hypokinesis of the apex.    Recommendations: Will continue DAPT with ASA and Brilinta for one year. Continue statin. No beta blocker currently secondary to  bradycardia. Echo in the am.     Echo 09/01/21 IMPRESSIONS     1. Left ventricular ejection fraction, by estimation, is 50 to 55%. The  left ventricle has low normal function. The left ventricle demonstrates  regional wall motion abnormalities (see scoring diagram/findings for  description). Left ventricular diastolic   parameters were normal.   2. Right ventricular systolic function is normal. The right ventricular  size is normal. Tricuspid regurgitation signal is inadequate for assessing  PA pressure.   3. The mitral valve is grossly normal. Trivial mitral valve  regurgitation. No evidence of mitral stenosis.   4. The aortic valve is tricuspid. There is mild calcification of the  aortic valve. Aortic valve regurgitation is not visualized. Aortic valve  sclerosis is present, with no evidence of aortic valve stenosis.   5. The inferior vena cava is normal in size with greater than 50%  respiratory variability, suggesting right atrial pressure of 3 mmHg.   FINDINGS   Left Ventricle: Left ventricular ejection fraction, by estimation, is 50  to 55%. The left ventricle has low normal function. The left ventricle  demonstrates regional wall motion abnormalities. The left ventricular  internal cavity size was normal in  size. There is no left ventricular hypertrophy. Left ventricular diastolic  parameters were normal.      LV Wall Scoring:  The apical septal segment and apex are hypokinetic.   Right Ventricle: The right ventricular size is normal. No increase in  right ventricular wall thickness. Right ventricular systolic function is  normal. Tricuspid regurgitation signal is inadequate for assessing PA  pressure.   Left Atrium: Left atrial size was normal in size.   Right Atrium: Right atrial size was normal in size.   Pericardium: There is no evidence of pericardial effusion. Presence of  epicardial fat layer.   Mitral Valve: The mitral valve is grossly normal. Trivial  mitral valve  regurgitation. No evidence of mitral valve stenosis.  Tricuspid Valve: The tricuspid valve is grossly normal. Tricuspid valve  regurgitation is trivial. No evidence of tricuspid stenosis.   Aortic Valve: The aortic valve is tricuspid. There is mild calcification  of the aortic valve. Aortic valve regurgitation is not visualized. Aortic  valve sclerosis is present, with no evidence of aortic valve stenosis.  Aortic valve peak gradient measures  5.5 mmHg.   Pulmonic Valve: The pulmonic valve was grossly normal. Pulmonic valve  regurgitation is not visualized. No evidence of pulmonic stenosis.   Aorta: The aortic root and ascending aorta are structurally normal, with  no evidence of dilitation.   Venous: The inferior vena cava is normal in size with greater than 50%  respiratory variability, suggesting right atrial pressure of 3 mmHg.   IAS/Shunts: The atrial septum is grossly normal.   Laboratory Data:  High Sensitivity Troponin:  No results for input(s): "TROPONINIHS" in the last 720 hours.   Chemistry Recent Labs  Lab 04/19/22 0720  NA 137  K 3.9  CL 104  CO2 25  GLUCOSE 111*  BUN 20  CREATININE 1.07  CALCIUM 9.1  GFRNONAA >60  ANIONGAP 8    Recent Labs  Lab 04/19/22 0720  PROT 6.9  ALBUMIN 3.9  AST 37  ALT 37  ALKPHOS 75  BILITOT 0.6   Lipids No results for input(s): "CHOL", "TRIG", "HDL", "LABVLDL", "LDLCALC", "CHOLHDL" in the last 168 hours.  Hematology Recent Labs  Lab 04/19/22 0720 04/19/22 1145  WBC 7.3 7.1  RBC 5.06 5.08  HGB 15.6 15.8  HCT 47.0 47.0  MCV 92.9 92.5  MCH 30.8 31.1  MCHC 33.2 33.6  RDW 13.0 12.8  PLT 296 300   Thyroid No results for input(s): "TSH", "FREET4" in the last 168 hours.  BNPNo results for input(s): "BNP", "PROBNP" in the last 168 hours.  DDimer No results for input(s): "DDIMER" in the last 168 hours.   Radiology/Studies:  CT ANGIO GI BLEED  Result Date: 04/19/2022 CLINICAL DATA:  Bloody stool.  EXAM: CTA ABDOMEN AND PELVIS WITHOUT AND WITH CONTRAST TECHNIQUE: Multidetector CT imaging of the abdomen and pelvis was performed using the standard protocol during bolus administration of intravenous contrast. Multiplanar reconstructed images and MIPs were obtained and reviewed to evaluate the vascular anatomy. RADIATION DOSE REDUCTION: This exam was performed according to the departmental dose-optimization program which includes automated exposure control, adjustment of the mA and/or kV according to patient size and/or use of iterative reconstruction technique. CONTRAST:  14m OMNIPAQUE IOHEXOL 350 MG/ML SOLN COMPARISON:  None Available. FINDINGS: VASCULAR Aorta: Atherosclerosis of thoracic aorta is noted without aneurysm formation. There appears to be a large atherosclerotic ulcer in the infrarenal abdominal aorta. Celiac: Patent without evidence of aneurysm, dissection, vasculitis or significant stenosis. SMA: Severe focal stenosis is noted in the proximal portion without thrombus. Renals: Both renal arteries are patent without evidence of aneurysm, dissection, vasculitis, fibromuscular dysplasia or significant stenosis. IMA: Patent without evidence of aneurysm, dissection, vasculitis or significant stenosis. Inflow: Patent without evidence of aneurysm, dissection, vasculitis or significant stenosis. Proximal Outflow: Bilateral common femoral and visualized portions of the superficial and profunda femoral arteries are patent without evidence of aneurysm, dissection, vasculitis or significant stenosis. Veins: No obvious venous abnormality within the limitations of this arterial phase study. Review of the MIP images confirms the above findings. NON-VASCULAR Lower chest: No acute abnormality. Hepatobiliary: No focal liver abnormality is seen. No gallstones, gallbladder wall thickening, or biliary dilatation. Pancreas: Unremarkable. No pancreatic ductal dilatation or surrounding inflammatory changes.  Spleen:  Normal in size without focal abnormality. Adrenals/Urinary Tract: Adrenal glands appear normal. No hydronephrosis or renal obstruction is noted. No renal or ureteral calculi are noted. Urinary bladder is unremarkable. Stomach/Bowel: The stomach and appendix are unremarkable. There is no evidence of bowel obstruction or inflammation. Stool is noted throughout the colon. Diverticulosis of descending and sigmoid colon is noted. There is noted active contrast extravasation in the proximal sigmoid colon concerning for active gastrointestinal hemorrhage. This is best seen on image number 67 of series 16. Lymphatic: No adenopathy is noted. Reproductive: Status post prostatectomy. Other: No abdominal wall hernia or abnormality. No abdominopelvic ascites. Musculoskeletal: No acute or significant osseous findings. IMPRESSION: VASCULAR There is noted active contrast extravasation in the proximal sigmoid colon concerning for active gastrointestinal hemorrhage. Critical Value/emergent results were called by telephone at the time of interpretation on 04/19/2022 at 1:06 pm to provider Godfrey Pick , who verbally acknowledged these results. Atherosclerosis of infrarenal abdominal aorta is noted with probable large atherosclerotic ulcer. Severe focal stenosis is seen involving proximal portion of superior mesenteric artery. NON-VASCULAR Extensive diverticulosis is seen involving the descending and sigmoid colon, with stool noted throughout the colon. Aortic Atherosclerosis (ICD10-I70.0). Electronically Signed   By: Marijo Conception M.D.   On: 04/19/2022 13:06     Assessment and Plan:   Acute GI bleed per ER MD and IM, pt on Brilinta and asa for stent to LAD in 08/31/21.  He is 7-8 months out, has not had Brilinta or ASA today.  ? Reversal with plts? Would prefer not to do this  hold on Brilinta and ASA for 24 hours then resume asa, at discharge most likely plavix.  CAD no angina hx NSTEMI 08/2021 and stent to LAD.   HLD continue  statin. Acute GI bleed.  Hgb stable.  GI is seeing Sinus brady in the 40s, he was brady in May as well asymptomatic. Will need to monitor   Risk Assessment/Risk Scores:       For questions or updates, please contact [place] Please consult [email or web address] for contact info under    Signed, Cecilie Kicks, NP  04/19/2022 2:08 PM  Patient seen and examined with Cecilie Kicks NP.  Agree as above, with the following exceptions and changes as noted below.  Patient is a 61 year old male patient of Dr. Alvester Chou who presents with acute GI bleeding.  CT demonstrates acute diverticular bleed with contrast extravasation into the bowel.  Fortunately patient is entirely clinically stable at the moment with a normal hemoglobin, normal blood pressure, and not in extremis.  He does note significant bruising which is somewhat spontaneous while on aspirin and Brilinta but has otherwise not had a source of major bleeding until recently.  No recent GI illness, feels his frequent bowel movements are more related to blood passage.  Gen: NAD, CV: RRR, no murmurs, Lungs: clear, Abd: soft, Extrem: Warm, well perfused, no edema, Neuro/Psych: alert and oriented x 3, normal mood and affect. All available labs, radiology testing, previous records reviewed.    Patient had PCI for NSTEMI/ACS in January 2023.  Optimally he would continue DAPT for 12 months without interruption.  However in the setting of continued bleeding, reasonable to hold aspirin and Plavix for 24 to 48 hours and monitor progression of bleeding while involving our colleagues in GI for further recommendations.  IR has consulted by phone and feel no urgent IR intervention is required given patient's stability.  If patient requires resuscitation for bleeding, would consider  FFP.  Clinical stability suggest that reversal of Brilinta is unlikely to be required at this time.    I discussed all this with the patient, his wife, and daughter at the bedside.  All  questions answered.  Plan to have the patient on the interventional cardiology service tomorrow for continued recommendations.  Ideally, when bleeding stops, could at a minimum resume aspirin.  He is not on any home antihypertensive therapy therefore nothing has to be held for the benefit of his blood pressure.  BP is normal.  Can continue statin.  Elouise Munroe, MD 04/19/22 4:00 PM

## 2022-04-19 NOTE — Telephone Encounter (Signed)
Patient calling stating a lot of blood in stools, may be clotting in the toilet. This is the only episode he had. Prior to this 9 months ago he had blood instool but did not need anything   - he had CATH in January s/p stent in mid LAD for nSTEMI. He is on aspirin/brilinta.   - I advised the patient to go to the local or near by ER to check his Hb. If his hb is low- he needs to be admitted for further work up including GI eval and scopes. If his hb is ok, then ok to continue antiplatelets. For any reason he continues to have GI bleed then stop brilinta (he is 8 months out of stenting) and get GI eval immediately. - patient understood and will follow recs.

## 2022-04-19 NOTE — Consult Note (Addendum)
Referring Provider: EDP Primary Care Physician:  Ginger Organ., MD Primary Gastroenterologist:  Dr. Hilarie Fredrickson  Reason for Consultation:  Diverticular bleed  HPI: Kevin Wallace is a 61 y.o. male with past medical history significant for coronary artery disease status post stent placement January 2023 on Brilinta and history of prostate cancer who has presented to [facility] with complaints of GI bleeding.  This started last evening, but since he woke up this morning has significantly worsened.  Passing bright red blood and clots.  Has had about 4 or 5 episodes here in the ED.  No abdominal pain.  Hemoglobin is normal.  CT angiogram showed contrast extravasation in the proximal sigmoid colon concerning for active gastrointestinal hemorrhage.  Also had extensive diverticulosis seen throughout the descending and sigmoid colon with stool noted throughout the colon.  Colonoscopy 12/2019:  - One 4 mm polyp at the ileocecal valve, removed with a cold snare. Resected and retrieved. - Severe diverticulosis from ascending colon to sigmoid colon. There was no evidence of diverticular bleeding. - Internal hemorrhoids.  IR and cardiology been consulted as well.   Past Medical History:  Diagnosis Date   Allergy    CAD (coronary artery disease)    s/p DES to mLAD 08/31/21   Cancer ([place]) 11/2019   Prostate Cancer -recently dx -surgery scheduled 8/54/62   Complication of anesthesia    took a long time to wear off    Gout    High cholesterol    diet controlled, no meds    Past Surgical History:  Procedure Laterality Date   ANKLE ARTHROSCOPY     "had it cleaned out"   CARDIAC CATHETERIZATION     COLONOSCOPY  10/14/2014   KNEE ARTHROSCOPY  07/2019   KNEE ARTHROSCOPY  1980s   LEFT HEART CATH AND CORONARY ANGIOGRAPHY N/A 08/31/2021   Procedure: LEFT HEART CATH AND CORONARY ANGIOGRAPHY;  Surgeon: Burnell Blanks, MD;  Location: [facility];  Service: Cardiovascular;   Laterality: N/A;   LYMPHADENECTOMY Bilateral 02/28/2020   Procedure: LYMPHADENECTOMY, PELVIC;  Surgeon: Raynelle Bring, MD;  Location: [facility];  Service: Urology;  Laterality: Bilateral;   ROBOT ASSISTED LAPAROSCOPIC RADICAL PROSTATECTOMY N/A 02/28/2020   Procedure: XI ROBOTIC ASSISTED LAPAROSCOPIC RADICAL PROSTATECTOMY LEVEL 2;  Surgeon: Raynelle Bring, MD;  Location: [facility];  Service: Urology;  Laterality: N/A;    Prior to Admission medications   Medication Sig Start Date End Date Taking? Authorizing Provider  allopurinol (ZYLOPRIM) 300 MG tablet Take 300 mg by mouth daily. 11/28/19   [provider]  aspirin 81 MG EC tablet Take 1 tablet (81 mg total) by mouth daily. Swallow whole. 09/02/21   Bhagat, Crista Luria, PA  nitroGLYCERIN (NITROSTAT) 0.4 MG SL tablet Place 1 tablet (0.4 mg total) under the tongue every 5 (five) minutes x 3 doses as needed for chest pain. 09/01/21   Bhagat, Crista Luria, PA  rosuvastatin (CRESTOR) 40 MG tablet Take 1 tablet (40 mg total) by mouth daily. Patient taking differently: Take 40 mg by mouth at bedtime. 09/01/21   [name], PA  ticagrelor (BRILINTA) 90 MG TABS tablet Take 1 tablet (90 mg total) by mouth 2 (two) times daily. 12/07/21   Lorretta Harp, MD    No current facility-administered medications for this encounter.   Current Outpatient Medications  Medication Sig Dispense Refill   allopurinol (ZYLOPRIM) 300 MG tablet Take 300 mg by mouth daily.     aspirin 81 MG EC tablet Take 1  tablet (81 mg total) by mouth daily. Swallow whole. 90 tablet 3   nitroGLYCERIN (NITROSTAT) 0.4 MG SL tablet Place 1 tablet (0.4 mg total) under the tongue every 5 (five) minutes x 3 doses as needed for chest pain. 25 tablet 12   rosuvastatin (CRESTOR) 40 MG tablet Take 1 tablet (40 mg total) by mouth daily. (Patient taking differently: Take 40 mg by mouth at bedtime.) 90 tablet 3   ticagrelor (BRILINTA) 90 MG TABS tablet Take 1 tablet (90 mg total) by mouth 2  (two) times daily. 180 tablet 3    Allergies as of 04/19/2022 - Review Complete 04/19/2022  Allergen Reaction Noted   Colchicine Diarrhea and Nausea And Vomiting 11/05/2012    Family History  Problem Relation Age of Onset   Rheumatic fever Father 23       Died from complications of RF but might have been a heart attack   Colon cancer Neg Hx    Rectal cancer Neg Hx    Stomach cancer Neg Hx    Esophageal cancer Neg Hx     Social History   Socioeconomic History   Marital status: Married    Spouse name: [name]   Number of children: 2   Years of education: 18   Highest education level: Master's degree (e.g., MA, MS, MEng, MEd, MSW, MBA)  Occupational History   Not on file  Tobacco Use   Smoking status: Never   Smokeless tobacco: Never  Vaping Use   Vaping Use: Never used  Substance and Sexual Activity   Alcohol use: No    Alcohol/week: 0.0 standard drinks of alcohol   Drug use: No   Sexual activity: Yes    Birth control/protection: None  Other Topics Concern   Not on file  Social History Narrative   Lives with wife.     Social Determinants of Health   Financial Resource Strain: Not on file  Food Insecurity: Not on file  Transportation Needs: Not on file  Physical Activity: Not on file  Stress: Not on file  Social Connections: Not on file  Intimate Partner Violence: Not on file   Review of Systems: ROS is O/W negative except as mentioned in HPI.  Physical Exam: Vital signs in last 24 hours: Temp:  [97.8 F (36.6 C)-97.9 F (36.6 C)] 97.9 F (36.6 C) (09/04 1348) Pulse Rate:  [45-54] 50 (09/04 1348) Resp:  [15-19] 16 (09/04 1348) BP: (110-136)/(65-78) 121/78 (09/04 1348) SpO2:  [93 %-97 %] 97 % (09/04 1348)   General:  Alert, Well-developed, well-nourished, pleasant and cooperative in NAD Head:  Normocephalic and atraumatic. Eyes:  Sclera clear, no icterus.  Conjunctiva pink. Ears:  Normal auditory acuity. Mouth:  No deformity or lesions.   Lungs:   Clear throughout to auscultation.  No wheezes, crackles, or rhonchi.  Heart:  Slightly bradycardic.  No M/R/G. Abdomen:  Soft, non-distended.  BS present.  Non-tender.   Msk:  Symmetrical without gross deformities. Pulses:  Normal pulses noted. Extremities:  Without clubbing or edema. Neurologic:  Alert and oriented x 4;  grossly normal neurologically. Skin:  Intact without significant lesions or rashes. Psych:  Alert and cooperative. Normal mood and affect.  Lab Results: Recent Labs    04/19/22 0720 04/19/22 1145  WBC 7.3 7.1  HGB 15.6 15.8  HCT 47.0 47.0  PLT 296 300   BMET Recent Labs    04/19/22 0720  NA 137  K 3.9  CL 104  CO2 25  GLUCOSE 111*  BUN 20  CREATININE 1.07  CALCIUM 9.1   LFT Recent Labs    04/19/22 0720  PROT 6.9  ALBUMIN 3.9  AST 37  ALT 37  ALKPHOS 75  BILITOT 0.6   Studies/Results: CT ANGIO GI BLEED  Result Date: 04/19/2022 CLINICAL DATA:  Bloody stool. EXAM: CTA ABDOMEN AND PELVIS WITHOUT AND WITH CONTRAST TECHNIQUE: Multidetector CT imaging of the abdomen and pelvis was performed using the standard protocol during bolus administration of intravenous contrast. Multiplanar reconstructed images and MIPs were obtained and reviewed to evaluate the vascular anatomy. RADIATION DOSE REDUCTION: This exam was performed according to the departmental dose-optimization program which includes automated exposure control, adjustment of the mA and/or kV according to patient size and/or use of iterative reconstruction technique. CONTRAST:  157m OMNIPAQUE IOHEXOL 350 MG/ML SOLN COMPARISON:  None Available. FINDINGS: VASCULAR Aorta: Atherosclerosis of thoracic aorta is noted without aneurysm formation. There appears to be a large atherosclerotic ulcer in the infrarenal abdominal aorta. Celiac: Patent without evidence of aneurysm, dissection, vasculitis or significant stenosis. SMA: Severe focal stenosis is noted in the proximal portion without thrombus. Renals: Both  renal arteries are patent without evidence of aneurysm, dissection, vasculitis, fibromuscular dysplasia or significant stenosis. IMA: Patent without evidence of aneurysm, dissection, vasculitis or significant stenosis. Inflow: Patent without evidence of aneurysm, dissection, vasculitis or significant stenosis. Proximal Outflow: Bilateral common femoral and visualized portions of the superficial and profunda femoral arteries are patent without evidence of aneurysm, dissection, vasculitis or significant stenosis. Veins: No obvious venous abnormality within the limitations of this arterial phase study. Review of the MIP images confirms the above findings. NON-VASCULAR Lower chest: No acute abnormality. Hepatobiliary: No focal liver abnormality is seen. No gallstones, gallbladder wall thickening, or biliary dilatation. Pancreas: Unremarkable. No pancreatic ductal dilatation or surrounding inflammatory changes. Spleen: Normal in size without focal abnormality. Adrenals/Urinary Tract: Adrenal glands appear normal. No hydronephrosis or renal obstruction is noted. No renal or ureteral calculi are noted. Urinary bladder is unremarkable. Stomach/Bowel: The stomach and appendix are unremarkable. There is no evidence of bowel obstruction or inflammation. Stool is noted throughout the colon. Diverticulosis of descending and sigmoid colon is noted. There is noted active contrast extravasation in the proximal sigmoid colon concerning for active gastrointestinal hemorrhage. This is best seen on image number 67 of series 16. Lymphatic: No adenopathy is noted. Reproductive: Status post prostatectomy. Other: No abdominal wall hernia or abnormality. No abdominopelvic ascites. Musculoskeletal: No acute or significant osseous findings. IMPRESSION: VASCULAR There is noted active contrast extravasation in the proximal sigmoid colon concerning for active gastrointestinal hemorrhage. Critical Value/emergent results were called by telephone  at the time of interpretation on 04/19/2022 at 1:06 pm to provider RGodfrey Pick, who verbally acknowledged these results. Atherosclerosis of infrarenal abdominal aorta is noted with probable large atherosclerotic ulcer. Severe focal stenosis is seen involving proximal portion of superior mesenteric artery. NON-VASCULAR Extensive diverticulosis is seen involving the descending and sigmoid colon, with stool noted throughout the colon. Aortic Atherosclerosis (ICD10-I70.0). Electronically Signed   By: JMarijo ConceptionM.D.   On: 04/19/2022 13:06    IMPRESSION:  *Acute diverticular bleed from the sigmoid colon seen by CTA: IR has been notified, but unclear at this point if they plan to proceed with intervention.  They had recommended reversal of his blood thinning agents, but he just had a cardiac stent placed in January.  Cardiology is seeing the patient as well.  Hemoglobin is currently normal. *Coronary artery disease a stent placed in January  2023 on Brilinta  PLAN: -From a GI standpoint recommend observation, monitoring of blood counts and transfuse if needed.  If he continues to bleed briskly then will need to proceed with interventional radiology embolization.   Laban Emperor. Zehr  04/19/2022, 2:53 PM  I have taken a history, reviewed the chart and examined the patient. I performed a substantive portion of this encounter, including complete performance of at least one of the key components, in conjunction with the APP. I agree with the APP's note, impression and recommendations  61 year old male with CAD s/p DES in Jan 2023 on ASA/Brillinta  with known diverticulosis based on recent colonoscopy, presenting with  painless hematochezia, positive CT-A in the sigmoid colon.  Patient is having a diverticular bleed.  He is hemodynamically stable and his hemoglobin was stable from 0700 to 1200, but expect this drop as he continues to report ongoing bleeding.  Recommend IR directed therapy of diverticular bleed, as  this has higher chance of success compared with endoscopic therapy.  If there is a contraindication to IR interventions, then colonoscopy could be considered, but this would require bowel prep and likelihood of identifying and treating culprit lesion is low.  Dr. Silverio Decamp will assume inpatient GI responsibilities tomorrow  Nicki Reaper E. Candis Schatz, MD [facility]

## 2022-04-20 DIAGNOSIS — I25118 Atherosclerotic heart disease of native coronary artery with other forms of angina pectoris: Secondary | ICD-10-CM

## 2022-04-20 DIAGNOSIS — K922 Gastrointestinal hemorrhage, unspecified: Secondary | ICD-10-CM | POA: Diagnosis not present

## 2022-04-20 DIAGNOSIS — K5731 Diverticulosis of large intestine without perforation or abscess with bleeding: Secondary | ICD-10-CM

## 2022-04-20 DIAGNOSIS — E782 Mixed hyperlipidemia: Secondary | ICD-10-CM | POA: Diagnosis not present

## 2022-04-20 LAB — BASIC METABOLIC PANEL
Anion gap: 8 (ref 5–15)
BUN: 13 mg/dL (ref 6–20)
CO2: 25 mmol/L (ref 22–32)
Calcium: 8.6 mg/dL — ABNORMAL LOW (ref 8.9–10.3)
Chloride: 103 mmol/L (ref 98–111)
Creatinine, Ser: 0.91 mg/dL (ref 0.61–1.24)
GFR, Estimated: >60 mL/min (ref >60)
Glucose, Bld: 89 mg/dL (ref 70–99)
Potassium: 3.7 mmol/L (ref 3.5–5.1)
Sodium: 136 mmol/L (ref 135–145)

## 2022-04-20 LAB — CBC
HCT: 41.9 % (ref 39.0–52.0)
HCT: 39.6 % (ref 39.0–52.0)
Hemoglobin: 14.4 g/dL (ref 13.0–17.0)
Hemoglobin: 13.5 g/dL (ref 13.0–17.0)
MCH: 31.7 pg (ref 26.0–34.0)
MCH: 31.5 pg (ref 26.0–34.0)
MCHC: 34.4 g/dL (ref 30.0–36.0)
MCHC: 34.1 g/dL (ref 30.0–36.0)
MCV: 92.3 fL (ref 80.0–100.0)
MCV: 92.3 fL (ref 80.0–100.0)
Platelets: 271 10*3/uL (ref 150–400)
Platelets: 276 10*3/uL (ref 150–400)
RBC: 4.54 MIL/uL (ref 4.22–5.81)
RBC: 4.29 MIL/uL (ref 4.22–5.81)
RDW: 12.9 % (ref 11.5–15.5)
RDW: 13 % (ref 11.5–15.5)
WBC: 10.3 10*3/uL (ref 4.0–10.5)
WBC: 6.1 10*3/uL (ref 4.0–10.5)
nRBC: 0 % (ref 0.0–0.2)
nRBC: 0 % (ref 0.0–0.2)

## 2022-04-20 MED ORDER — ONDANSETRON HCL 4 MG/2ML IJ SOLN: 4.0000 mg | Freq: Four times a day (QID) | INTRAMUSCULAR | Status: DC | PRN

## 2022-04-20 MED ORDER — IPRATROPIUM-ALBUTEROL 0.5-2.5 (3) MG/3ML IN SOLN: 3.0000 mL | RESPIRATORY_TRACT | Status: DC | PRN

## 2022-04-20 MED ORDER — HYDRALAZINE HCL 20 MG/ML IJ SOLN: 10.0000 mg | INTRAMUSCULAR | Status: DC | PRN

## 2022-04-20 MED ORDER — SENNOSIDES-DOCUSATE SODIUM 8.6-50 MG PO TABS: 1.0000 | ORAL_TABLET | Freq: Every evening | ORAL | Status: DC | PRN

## 2022-04-20 MED ORDER — TRAZODONE HCL 50 MG PO TABS
50.0000 mg | ORAL_TABLET | Freq: Every evening | ORAL | Status: DC | PRN
  Administered 2022-04-20: 50 mg via ORAL
  Filled 2022-04-20: qty 1

## 2022-04-20 NOTE — ED Notes (Signed)
Pt in room A&O x4. Updated on plan of care, Denies any pain. Pt ambulated with a steady gait to restroom. Denies N/V/D or Lightheadedness.

## 2022-04-20 NOTE — Progress Notes (Addendum)
Daily Rounding Note  04/20/2022, 10:11 AM  LOS: 0 days   SUBJECTIVE:   Chief complaint: Painless hematochezia.  Diverticular bleed.  Bleeding has slowed and volume is smaller but persistent hematochezia this morning.  Denies dizziness, abdominal pain, weakness.  On clear liquids though he is not consuming a whole lot as it is not very appetizing to him.   OBJECTIVE:         Vital signs in last 24 hours:    Temp:  [97.7 F (36.5 C)-98.8 F (37.1 C)] 98.8 F (37.1 C) (09/05 0739) Pulse Rate:  [45-57] 57 (09/05 0739) Resp:  [15-21] 16 (09/05 0739) BP: (101-135)/(55-82) 101/55 (09/05 0739) SpO2:  [85 %-97 %] 95 % (09/05 0739)   There were no vitals filed for this visit. General: Looks well.  NAD Heart: RRR Chest: Clear bilaterally.  No labored breathing or cough Abdomen: Soft.  Nondistended, nontender.  Active bowel sounds. Extremities: No CCE Neuro/Psych: Cooperative, calm, fluid speech.  No gross deficits.  No tremors.  Intake/Output from previous day: No intake/output data recorded.  Intake/Output this shift: No intake/output data recorded.  Lab Results: Recent Labs    04/19/22 0720 04/19/22 1145 04/20/22 0301  WBC 7.3 7.1 10.3  HGB 15.6 15.8 14.4  HCT 47.0 47.0 41.9  PLT 296 300 271   BMET Recent Labs    04/19/22 0720 04/20/22 0301  NA 137 136  K 3.9 3.7  CL 104 103  CO2 25 25  GLUCOSE 111* 89  BUN 20 13  CREATININE 1.07 0.91  CALCIUM 9.1 8.6*   LFT Recent Labs    04/19/22 0720  PROT 6.9  ALBUMIN 3.9  AST 37  ALT 37  ALKPHOS 75  BILITOT 0.6   PT/INR No results for input(s): "LABPROT", "INR" in the last 72 hours. Hepatitis Panel No results for input(s): "HEPBSAG", "HCVAB", "HEPAIGM", "HEPBIGM" in the last 72 hours.  Studies/Results: CT ANGIO GI BLEED  Result Date: 04/19/2022 CLINICAL DATA:  Bloody stool. EXAM: CTA ABDOMEN AND PELVIS WITHOUT AND WITH CONTRAST TECHNIQUE:  Multidetector CT imaging of the abdomen and pelvis was performed using the standard protocol during bolus administration of intravenous contrast. Multiplanar reconstructed images and MIPs were obtained and reviewed to evaluate the vascular anatomy. RADIATION DOSE REDUCTION: This exam was performed according to the departmental dose-optimization program which includes automated exposure control, adjustment of the mA and/or kV according to patient size and/or use of iterative reconstruction technique. CONTRAST:  143m OMNIPAQUE IOHEXOL 350 MG/ML SOLN COMPARISON:  None Available. FINDINGS: VASCULAR Aorta: Atherosclerosis of thoracic aorta is noted without aneurysm formation. There appears to be a large atherosclerotic ulcer in the infrarenal abdominal aorta. Celiac: Patent without evidence of aneurysm, dissection, vasculitis or significant stenosis. SMA: Severe focal stenosis is noted in the proximal portion without thrombus. Renals: Both renal arteries are patent without evidence of aneurysm, dissection, vasculitis, fibromuscular dysplasia or significant stenosis. IMA: Patent without evidence of aneurysm, dissection, vasculitis or significant stenosis. Inflow: Patent without evidence of aneurysm, dissection, vasculitis or significant stenosis. Proximal Outflow: Bilateral common femoral and visualized portions of the superficial and profunda femoral arteries are patent without evidence of aneurysm, dissection, vasculitis or significant stenosis. Veins: No obvious venous abnormality within the limitations of this arterial phase study. Review of the MIP images confirms the above findings. NON-VASCULAR Lower chest: No acute abnormality. Hepatobiliary: No focal liver abnormality is seen. No gallstones, gallbladder wall thickening, or biliary dilatation. Pancreas: Unremarkable. No pancreatic  ductal dilatation or surrounding inflammatory changes. Spleen: Normal in size without focal abnormality. Adrenals/Urinary Tract:  Adrenal glands appear normal. No hydronephrosis or renal obstruction is noted. No renal or ureteral calculi are noted. Urinary bladder is unremarkable. Stomach/Bowel: The stomach and appendix are unremarkable. There is no evidence of bowel obstruction or inflammation. Stool is noted throughout the colon. Diverticulosis of descending and sigmoid colon is noted. There is noted active contrast extravasation in the proximal sigmoid colon concerning for active gastrointestinal hemorrhage. This is best seen on image number 67 of series 16. Lymphatic: No adenopathy is noted. Reproductive: Status post prostatectomy. Other: No abdominal wall hernia or abnormality. No abdominopelvic ascites. Musculoskeletal: No acute or significant osseous findings. IMPRESSION: VASCULAR There is noted active contrast extravasation in the proximal sigmoid colon concerning for active gastrointestinal hemorrhage. Critical Value/emergent results were called by telephone at the time of interpretation on 04/19/2022 at 1:06 pm to provider Godfrey Pick , who verbally acknowledged these results. Atherosclerosis of infrarenal abdominal aorta is noted with probable large atherosclerotic ulcer. Severe focal stenosis is seen involving proximal portion of superior mesenteric artery. NON-VASCULAR Extensive diverticulosis is seen involving the descending and sigmoid colon, with stool noted throughout the colon. Aortic Atherosclerosis (ICD10-I70.0). Electronically Signed   By: Marijo Conception M.D.   On: 04/19/2022 13:06     Scheduled Meds:  allopurinol  300 mg Oral Daily   rosuvastatin  40 mg Oral QHS   Continuous Infusions:  lactated ringers 100 mL/hr at 04/20/22 0233   PRN Meds:.acetaminophen **OR** acetaminophen, hydrALAZINE, ipratropium-albuterol, ondansetron (ZOFRAN) IV, ondansetron **OR** [DISCONTINUED] ondansetron (ZOFRAN) IV, senna-docusate, traZODone  ASSESMENT:     Diverticular bleed.  Colonoscopy in 12/2019 with removal of 4 mm polyp and  severe ascending to sigmoid diverticulosis, internal hemorrhoids.  CTA: Active bleed at proximal sigmoid colon.  Extensive diverticulosis.  IR recommended acute/aggressive reversal of Brilinta, no plans as of yesterday afternoon for IR intervention.   Chronic DAPT Brilinta/ASA after 08/2021 cardiac stent.  On hold, last dose 9/3 in PM..  Cardiology following and okay with holding Brilinta but would like to start aspirin once deemed safe from standpoint of GI bleed.  Has not received any active reversal agents.  Hgb dropped from 15.8... 14.4.  Normal platelets, normal INR.   PLAN   Continue supportive care.  Leave on clears for now.  Switch to CBC to every 5 am/5 PM.    Kevin Wallace  04/20/2022, 10:11 AM Phone [phone]    Attending physician's note   I have taken a history, reviewed the chart and examined the patient. I performed a substantive portion of this encounter, including complete performance of at least one of the key components, in conjunction with the APP. I agree with the APP's note, impression and recommendations.    Painless hematochezia secondary to diverticular hemorrhage  On aspirin and Brilinta, IR recommended holding of coil embolization  Continue to monitor Possible self-limited episode of diverticular hemorrhage, no hemodynamic instability Clear liquids for today, can advance diet slowly tomorrow if hemoglobin remains stable  He is up-to-date with colorectal cancer screening No plan for colonoscopy at this point unless he continues to have recurrent episodes of hematochezia   The patient was provided an opportunity to ask questions and all were answered. The patient agreed with the plan and demonstrated an understanding of the instructions.   Damaris Hippo , MD [phone]

## 2022-04-20 NOTE — Progress Notes (Signed)
PROGRESS NOTE    Kevin Wallace  DZH:[id number] DOB: 10-27-1960 DOA: 04/19/2022 PCP: Kevin Wallace., MD   Brief Narrative:  61 y.o. male with medical history significant of CAD s/p stent in 08/2021, HLD, OSA on CPAP, and prostate CA presenting with hematochezia.  CT showed severe diverticulosis with focal active extravasation at sigmoid. IR recommendation for no acute intervention.  GI and cardiology team were consulted.  Cardiology recommended discontinuing dual antiplatelet therapy and eventually resuming only Plavix.   Assessment & Plan:  Principal Problem:   Acute lower GI bleeding Active Problems:   Prostate cancer ([name])   Hyperlipidemia   Obstructive sleep apnea   Coronary artery disease    Bright red blood per rectum, diverticular bleed CTA yesterday showed active diverticular bleed, IR did not recommend any acute intervention and recommended reversal of anticoagulation.  Case discussed with cardiology and gastroenterology.  It was decided to stop his dual antiplatelet therapy aspirin and Brilinta.  Eventually will plan to resume only Plavix as monotherapy.  For now closely monitor hemoglobin levels every 12 hours and maintain clear liquid diet.   CAD stent is post PCI January 2023 Seen by cardiology, they are comfortable with discontinuing dual antiplatelet therapy as patient is about 6 months out of his PCI and eventually resuming only Plavix.   HLD Crestor   OSA  CPAP   Prostate CA  Status post prostatectomy  DVT prophylaxis: SCDs Start: 04/19/22 1549 Code Status: Full Family Communication:  Family at Bedside.   On going eval for rectal bleeding.     Subjective: Had one episodes of small BRBPR followed by some melena. Patient states it was better.    Examination:  General exam: Appears calm and comfortable  Respiratory system: Clear to auscultation. Respiratory effort normal. Cardiovascular system: S1 & S2 heard, RRR. No JVD, murmurs, rubs, gallops or  clicks. No pedal edema. Gastrointestinal system: Abdomen is nondistended, soft and nontender. No organomegaly or masses felt. Normal bowel sounds heard. Central nervous system: Alert and oriented. No focal neurological deficits. Extremities: Symmetric 5 x 5 power. Skin: No rashes, lesions or ulcers Psychiatry: Judgement and insight appear normal. Mood & affect appropriate.     Objective: Vitals:   04/20/22 1100 04/20/22 1124 04/20/22 1205 04/20/22 1209  BP: 120/79   130/84  Pulse: (!) 51   (!) 53  Resp: 19   18  Temp:  97.9 F (36.6 C)  97.8 F (36.6 C)  TempSrc:  Oral    SpO2: 96%   97%  Weight:   86.7 kg   Height:   '5\' 11"'$  (1.803 m)    No intake or output data in the 24 hours ending 04/20/22 1257 Filed Weights   04/20/22 1205  Weight: 86.7 kg     Data Reviewed:   CBC: Recent Labs  Lab 04/19/22 0720 04/19/22 1145 04/20/22 0301  WBC 7.3 7.1 10.3  HGB 15.6 15.8 14.4  HCT 47.0 47.0 41.9  MCV 92.9 92.5 92.3  PLT 296 300 419   Basic Metabolic Panel: Recent Labs  Lab 04/19/22 0720 04/20/22 0301  NA 137 136  K 3.9 3.7  CL 104 103  CO2 25 25  GLUCOSE 111* 89  BUN 20 13  CREATININE 1.07 0.91  CALCIUM 9.1 8.6*   GFR: Estimated Creatinine Clearance: 91.9 mL/min (by C-G formula based on SCr of 0.91 mg/dL). Liver Function Tests: Recent Labs  Lab 04/19/22 0720  AST 37  ALT 37  ALKPHOS 75  BILITOT 0.6  PROT 6.9  ALBUMIN 3.9   No results for input(s): "LIPASE", "AMYLASE" in the last 168 hours. No results for input(s): "AMMONIA" in the last 168 hours. Coagulation Profile: No results for input(s): "INR", "PROTIME" in the last 168 hours. Cardiac Enzymes: No results for input(s): "CKTOTAL", "CKMB", "CKMBINDEX", "TROPONINI" in the last 168 hours. BNP (last 3 results) No results for input(s): "PROBNP" in the last 8760 hours. HbA1C: No results for input(s): "HGBA1C" in the last 72 hours. CBG: No results for input(s): "GLUCAP" in the last 168 hours. Lipid  Profile: No results for input(s): "CHOL", "HDL", "LDLCALC", "TRIG", "CHOLHDL", "LDLDIRECT" in the last 72 hours. Thyroid Function Tests: No results for input(s): "TSH", "T4TOTAL", "FREET4", "T3FREE", "THYROIDAB" in the last 72 hours. Anemia Panel: No results for input(s): "VITAMINB12", "FOLATE", "FERRITIN", "TIBC", "IRON", "RETICCTPCT" in the last 72 hours. Sepsis Labs: No results for input(s): "PROCALCITON", "LATICACIDVEN" in the last 168 hours.  No results found for this or any previous visit (from the past 240 hour(s)).       Radiology Studies: CT ANGIO GI BLEED  Result Date: 04/19/2022 CLINICAL DATA:  Bloody stool. EXAM: CTA ABDOMEN AND PELVIS WITHOUT AND WITH CONTRAST TECHNIQUE: Multidetector CT imaging of the abdomen and pelvis was performed using the standard protocol during bolus administration of intravenous contrast. Multiplanar reconstructed images and MIPs were obtained and reviewed to evaluate the vascular anatomy. RADIATION DOSE REDUCTION: This exam was performed according to the departmental dose-optimization program which includes automated exposure control, adjustment of the mA and/or kV according to patient size and/or use of iterative reconstruction technique. CONTRAST:  142m OMNIPAQUE IOHEXOL 350 MG/ML SOLN COMPARISON:  None Available. FINDINGS: VASCULAR Aorta: Atherosclerosis of thoracic aorta is noted without aneurysm formation. There appears to be a large atherosclerotic ulcer in the infrarenal abdominal aorta. Celiac: Patent without evidence of aneurysm, dissection, vasculitis or significant stenosis. SMA: Severe focal stenosis is noted in the proximal portion without thrombus. Renals: Both renal arteries are patent without evidence of aneurysm, dissection, vasculitis, fibromuscular dysplasia or significant stenosis. IMA: Patent without evidence of aneurysm, dissection, vasculitis or significant stenosis. Inflow: Patent without evidence of aneurysm, dissection, vasculitis or  significant stenosis. Proximal Outflow: Bilateral common femoral and visualized portions of the superficial and profunda femoral arteries are patent without evidence of aneurysm, dissection, vasculitis or significant stenosis. Veins: No obvious venous abnormality within the limitations of this arterial phase study. Review of the MIP images confirms the above findings. NON-VASCULAR Lower chest: No acute abnormality. Hepatobiliary: No focal liver abnormality is seen. No gallstones, gallbladder wall thickening, or biliary dilatation. Pancreas: Unremarkable. No pancreatic ductal dilatation or surrounding inflammatory changes. Spleen: Normal in size without focal abnormality. Adrenals/Urinary Tract: Adrenal glands appear normal. No hydronephrosis or renal obstruction is noted. No renal or ureteral calculi are noted. Urinary bladder is unremarkable. Stomach/Bowel: The stomach and appendix are unremarkable. There is no evidence of bowel obstruction or inflammation. Stool is noted throughout the colon. Diverticulosis of descending and sigmoid colon is noted. There is noted active contrast extravasation in the proximal sigmoid colon concerning for active gastrointestinal hemorrhage. This is best seen on image number 67 of series 16. Lymphatic: No adenopathy is noted. Reproductive: Status post prostatectomy. Other: No abdominal wall hernia or abnormality. No abdominopelvic ascites. Musculoskeletal: No acute or significant osseous findings. IMPRESSION: VASCULAR There is noted active contrast extravasation in the proximal sigmoid colon concerning for active gastrointestinal hemorrhage. Critical Value/emergent results were called by telephone at the time of interpretation on 04/19/2022 at 1:06 pm to  provider Godfrey Pick , who verbally acknowledged these results. Atherosclerosis of infrarenal abdominal aorta is noted with probable large atherosclerotic ulcer. Severe focal stenosis is seen involving proximal portion of superior  mesenteric artery. NON-VASCULAR Extensive diverticulosis is seen involving the descending and sigmoid colon, with stool noted throughout the colon. Aortic Atherosclerosis (ICD10-I70.0). Electronically Signed   By: Marijo Conception M.D.   On: 04/19/2022 13:06        Scheduled Meds:  allopurinol  300 mg Oral Daily   rosuvastatin  40 mg Oral QHS   Continuous Infusions:  lactated ringers 100 mL/hr at 04/20/22 1238     LOS: 0 days   Time spent= 35 mins    Olene Godfrey Arsenio Loader, MD [facility]  If 7PM-7AM, please contact night-coverage  04/20/2022, 12:57 PM

## 2022-04-20 NOTE — Progress Notes (Signed)
Pt refused CPAP for the evening. RT will cont to monitor as needed.

## 2022-04-20 NOTE — Plan of Care (Signed)
  Problem: Education: Goal: Knowledge of General Education information will improve Description Including pain rating scale, medication(s)/side effects and non-pharmacologic comfort measures Outcome: Progressing   

## 2022-04-20 NOTE — Progress Notes (Signed)
New Admission Note:   Arrival Method: stretcher Mental Orientation: Alert and oriented  Telemetry: Box 11 Assessment: Completed Skin: intact abrasion to right shin  WV:[id number] AC  Pain: 0/10  Tubes: none  Safety Measures: Safety Fall Prevention Plan has been given, discussed and signed Admission: Completed [facility] Orientation: Patient has been orientated to the room, unit and staff.  Family: wife and daughter   Orders have been reviewed and implemented. Will continue to monitor the patient. Call light has been placed within reach and bed alarm has been activated.   Redmond Whittley RN [facility] Phone: [phone]

## 2022-04-20 NOTE — Progress Notes (Addendum)
Rounding Note    Patient Name: Kevin Wallace Date of Encounter: 04/20/2022  [place] Cardiologist: Quay Burow, MD   Subjective   Reports his GI bleeding has improved but still passing blood this morning.  Inpatient Medications    Scheduled Meds:  allopurinol  300 mg Oral Daily   rosuvastatin  40 mg Oral QHS   Continuous Infusions:  lactated ringers 100 mL/hr at 04/20/22 0233   PRN Meds: acetaminophen **OR** acetaminophen, hydrALAZINE, ondansetron **OR** ondansetron (ZOFRAN) IV   Vital Signs    Vitals:   04/20/22 0400 04/20/22 0500 04/20/22 0600 04/20/22 0739  BP: 125/70 111/76 112/75 (!) 101/55  Pulse: (!) 57 (!) 51 (!) 56 (!) 57  Resp: 17 15 (!) 21 16  Temp:    98.8 F (37.1 C)  TempSrc:    Oral  SpO2: 93% 93% (!) 85% 95%   No intake or output data in the 24 hours ending 04/20/22 0856    12/24/2021    3:34 PM 12/23/2021    4:50 PM 12/01/2021   11:21 AM  Last 3 Weights  Weight (lbs) 191 lb 6.4 oz 197 lb 5 oz 193 lb 9.6 oz  Weight (kg) 86.818 kg 89.5 kg 87.816 kg      Telemetry    Sinus bradycardia- Personally Reviewed  ECG    No new tracing  Physical Exam   GEN: No acute distress.   Neck: No JVD Cardiac: RRR, no murmurs, rubs, or gallops.  Respiratory: Clear to auscultation bilaterally. GI: Soft, nontender, non-distended  MS: No edema; No deformity. Neuro:  Nonfocal  Psych: Normal affect   Labs    High Sensitivity Troponin:  No results for input(s): "TROPONINIHS" in the last 720 hours.   Chemistry Recent Labs  Lab 04/19/22 0720 04/20/22 0301  NA 137 136  K 3.9 3.7  CL 104 103  CO2 25 25  GLUCOSE 111* 89  BUN 20 13  CREATININE 1.07 0.91  CALCIUM 9.1 8.6*  PROT 6.9  --   ALBUMIN 3.9  --   AST 37  --   ALT 37  --   ALKPHOS 75  --   BILITOT 0.6  --   GFRNONAA >60 >60  ANIONGAP 8 8    Lipids No results for input(s): "CHOL", "TRIG", "HDL", "LABVLDL", "LDLCALC", "CHOLHDL" in the last 168 hours.  Hematology Recent  Labs  Lab 04/19/22 0720 04/19/22 1145 04/20/22 0301  WBC 7.3 7.1 10.3  RBC 5.06 5.08 4.54  HGB 15.6 15.8 14.4  HCT 47.0 47.0 41.9  MCV 92.9 92.5 92.3  MCH 30.8 31.1 31.7  MCHC 33.2 33.6 34.4  RDW 13.0 12.8 12.9  PLT 296 300 271   Thyroid No results for input(s): "TSH", "FREET4" in the last 168 hours.  BNPNo results for input(s): "BNP", "PROBNP" in the last 168 hours.  DDimer No results for input(s): "DDIMER" in the last 168 hours.   Radiology    CT ANGIO GI BLEED  Result Date: 04/19/2022 CLINICAL DATA:  Bloody stool. EXAM: CTA ABDOMEN AND PELVIS WITHOUT AND WITH CONTRAST TECHNIQUE: Multidetector CT imaging of the abdomen and pelvis was performed using the standard protocol during bolus administration of intravenous contrast. Multiplanar reconstructed images and MIPs were obtained and reviewed to evaluate the vascular anatomy. RADIATION DOSE REDUCTION: This exam was performed according to the departmental dose-optimization program which includes automated exposure control, adjustment of the mA and/or kV according to patient size and/or use of iterative reconstruction technique. CONTRAST:  132m  OMNIPAQUE IOHEXOL 350 MG/ML SOLN COMPARISON:  None Available. FINDINGS: VASCULAR Aorta: Atherosclerosis of thoracic aorta is noted without aneurysm formation. There appears to be a large atherosclerotic ulcer in the infrarenal abdominal aorta. Celiac: Patent without evidence of aneurysm, dissection, vasculitis or significant stenosis. SMA: Severe focal stenosis is noted in the proximal portion without thrombus. Renals: Both renal arteries are patent without evidence of aneurysm, dissection, vasculitis, fibromuscular dysplasia or significant stenosis. IMA: Patent without evidence of aneurysm, dissection, vasculitis or significant stenosis. Inflow: Patent without evidence of aneurysm, dissection, vasculitis or significant stenosis. Proximal Outflow: Bilateral common femoral and visualized portions of the  superficial and profunda femoral arteries are patent without evidence of aneurysm, dissection, vasculitis or significant stenosis. Veins: No obvious venous abnormality within the limitations of this arterial phase study. Review of the MIP images confirms the above findings. NON-VASCULAR Lower chest: No acute abnormality. Hepatobiliary: No focal liver abnormality is seen. No gallstones, gallbladder wall thickening, or biliary dilatation. Pancreas: Unremarkable. No pancreatic ductal dilatation or surrounding inflammatory changes. Spleen: Normal in size without focal abnormality. Adrenals/Urinary Tract: Adrenal glands appear normal. No hydronephrosis or renal obstruction is noted. No renal or ureteral calculi are noted. Urinary bladder is unremarkable. Stomach/Bowel: The stomach and appendix are unremarkable. There is no evidence of bowel obstruction or inflammation. Stool is noted throughout the colon. Diverticulosis of descending and sigmoid colon is noted. There is noted active contrast extravasation in the proximal sigmoid colon concerning for active gastrointestinal hemorrhage. This is best seen on image number 67 of series 16. Lymphatic: No adenopathy is noted. Reproductive: Status post prostatectomy. Other: No abdominal wall hernia or abnormality. No abdominopelvic ascites. Musculoskeletal: No acute or significant osseous findings. IMPRESSION: VASCULAR There is noted active contrast extravasation in the proximal sigmoid colon concerning for active gastrointestinal hemorrhage. Critical Value/emergent results were called by telephone at the time of interpretation on 04/19/2022 at 1:06 pm to provider Godfrey Pick , who verbally acknowledged these results. Atherosclerosis of infrarenal abdominal aorta is noted with probable large atherosclerotic ulcer. Severe focal stenosis is seen involving proximal portion of superior mesenteric artery. NON-VASCULAR Extensive diverticulosis is seen involving the descending and sigmoid  colon, with stool noted throughout the colon. Aortic Atherosclerosis (ICD10-I70.0). Electronically Signed   By: Marijo Conception M.D.   On: 04/19/2022 13:06    Cardiac Studies   N/a   Patient Profile     61 y.o. male  a hx of CAD with NSTEMI 08/2021 with stent to mLAD other nonobstructive CAD treated medically and stable EF, HTN, HLD and hx prostate cancer who was seen 04/19/2022 for the evaluation of  antiplatelet recommendation with acute GI bleed at the request of Dr. Doren Custard.   Assessment & Plan    Acute GI bleed Diverticulosis -- Presented with acute GI bleed, FOBT positive.  Reports several episodes of large stools with bright red blood and clots prior to admission.  Hemoglobin 15.6>> 15.8>> 14.4.  Seen by GI with recommendation for IR directed therapy of diverticular bleed.  CT showed severe diverticulosis with focal active extravasation at sigmoid. IR recommendation for no acute intervention.  Patient reports his bleeding has slowed significantly but still passing blood this morning. -- follow up recs from GI/IR  CAD s/p stent to LAD 08/2021 -- Placed on DAPT with aspirin/Brilinta and tolerated well up until recently.  DAPT has been held currently.  Ideally need to place back on aspirin once safe from a GI standpoint, +/- Plavix -- Continue Crestor 40 mg daily  Hyperlipidemia -- Crestor 40 mg daily  Bradycardia -- Asymptomatic  For questions or updates, please contact [place] Please consult [email or web address] for contact info under        Signed, Reino Bellis, NP  04/20/2022, 8:56 AM    I have examined the patient and reviewed assessment and plan and discussed with patient.  Agree with above as stated.    Patient has been taking Brilinta 90 mg twice daily as well as a baby aspirin daily.  Now with likely diverticular bleed.  I personally reviewed his cardiac cath films.  He had a long stent placed in the mid LAD.  At the proximal end of the stent, there is a  moderate size diagonal which arises which does have a significant stenosis.  Since he is over 6 months after his stent, we have more flexibility in terms of his antiplatelet therapy.  Continue with plans to hold antiplatelet therapy for at least a few days.  In terms of going forward and reducing his risk of bleeding, I think it would be reasonable to use clopidogrel 75 mg daily.  Would not use Brilinta as this medicine may be associated with higher bleeding risk.  Would hold aspirin as well.  Hopefully, on clopidogrel monotherapy, his bleeding will not return.  We will follow along.  Larae Grooms

## 2022-04-21 DIAGNOSIS — K5731 Diverticulosis of large intestine without perforation or abscess with bleeding: Secondary | ICD-10-CM | POA: Diagnosis not present

## 2022-04-21 DIAGNOSIS — K922 Gastrointestinal hemorrhage, unspecified: Secondary | ICD-10-CM | POA: Diagnosis not present

## 2022-04-21 LAB — CBC
HCT: 38.6 % — ABNORMAL LOW (ref 39.0–52.0)
Hemoglobin: 13.2 g/dL (ref 13.0–17.0)
MCH: 31.4 pg (ref 26.0–34.0)
MCHC: 34.2 g/dL (ref 30.0–36.0)
MCV: 91.9 fL (ref 80.0–100.0)
Platelets: 263 10*3/uL (ref 150–400)
RBC: 4.2 MIL/uL — ABNORMAL LOW (ref 4.22–5.81)
RDW: 12.8 % (ref 11.5–15.5)
WBC: 6.6 10*3/uL (ref 4.0–10.5)
nRBC: 0 % (ref 0.0–0.2)

## 2022-04-21 LAB — BASIC METABOLIC PANEL
Anion gap: 7 (ref 5–15)
BUN: 8 mg/dL (ref 6–20)
CO2: 29 mmol/L (ref 22–32)
Calcium: 8.9 mg/dL (ref 8.9–10.3)
Chloride: 103 mmol/L (ref 98–111)
Creatinine, Ser: 1.01 mg/dL (ref 0.61–1.24)
GFR, Estimated: >60 mL/min (ref >60)
Glucose, Bld: 98 mg/dL (ref 70–99)
Potassium: 4.1 mmol/L (ref 3.5–5.1)
Sodium: 139 mmol/L (ref 135–145)

## 2022-04-21 LAB — MAGNESIUM: Magnesium: 2.2 mg/dL (ref 1.7–2.4)

## 2022-04-21 MED ORDER — CLOPIDOGREL BISULFATE 75 MG PO TABS: 75.0000 mg | ORAL_TABLET | Freq: Every day | ORAL | 0 refills | Status: DC

## 2022-04-21 NOTE — Plan of Care (Signed)
  Problem: Clinical Measurements: Goal: Diagnostic test results will improve Outcome: Progressing   Problem: Clinical Measurements: Goal: Cardiovascular complication will be avoided Outcome: Progressing   Problem: Nutrition: Goal: Adequate nutrition will be maintained Outcome: Progressing   Problem: Coping: Goal: Level of anxiety will decrease Outcome: Progressing

## 2022-04-21 NOTE — Progress Notes (Addendum)
Daily Rounding Note  04/21/2022, 1:31 PM  LOS: 0 days   SUBJECTIVE:   Chief complaint:   Painless hematochezia, diverticular bleed.  Latest BM ~ 10 to 11 PM last noc.  Was dark, black w halo of red blood Feeling well.  No dizziness, no weakness, no CP, no abd pain, no SOB   OBJECTIVE:         Vital signs in last 24 hours:    Temp:  [97.2 F (36.2 C)-98.4 F (36.9 C)] 98.1 F (36.7 C) (09/06 1021) Pulse Rate:  [48-52] 48 (09/06 1021) Resp:  [18] 18 (09/06 1021) BP: (111-118)/(60-79) 112/68 (09/06 1021) SpO2:  [93 %-98 %] 93 % (09/06 1021) Last BM Date : 04/20/22 Filed Weights   04/20/22 1205  Weight: 86.7 kg   General: Looks well.  Vigorous.  NAD. Heart: RRR. Chest: No labored breathing or cough. Abdomen: Soft without distention.   Extremities: No CCE Neuro/Psych: Pleasant, calm.  Fully alert and oriented.  Fluid speech.                                     Intake/Output from previous day: 09/05 0701 - 09/06 0700 In: 4267.1 [P.O.:840; I.V.:3427.1] Out: 775 [Urine:775]  Intake/Output this shift: No intake/output data recorded.  Lab Results: Recent Labs    04/20/22 0301 04/20/22 1606 04/21/22 1006  WBC 10.3 6.1 6.6  HGB 14.4 13.5 13.2  HCT 41.9 39.6 38.6*  PLT 271 276 263   BMET Recent Labs    04/19/22 0720 04/20/22 0301 04/21/22 1006  NA 137 136 139  K 3.9 3.7 4.1  CL 104 103 103  CO2 '25 25 29  '$ GLUCOSE 111* 89 98  BUN '20 13 8  '$ CREATININE 1.07 0.91 1.01  CALCIUM 9.1 8.6* 8.9   LFT Recent Labs    04/19/22 0720  PROT 6.9  ALBUMIN 3.9  AST 37  ALT 37  ALKPHOS 75  BILITOT 0.6   PT/INR No results for input(s): "LABPROT", "INR" in the last 72 hours. Hepatitis Panel No results for input(s): "HEPBSAG", "HCVAB", "HEPAIGM", "HEPBIGM" in the last 72 hours.  Studies/Results: No results found.  ASSESMENT:   Painless hematochezia, presumed diverticular bleed.  CTA confirmed active  bleeding at proximal sigmoid and extensive diverticulosis.  IR did not pursue or recommend intervention favoring reversal of his anticoagulation.  Colonoscopy in 12/2019 ([facility]).    Chronic DAPT, Brilinta/aspirin.  Cardiac stent 08/2021.  These are on hold.  Her cardiologist's note yesterday the plan is to restart the patient on Plavix and not restart Brilinta.  Drop in Hgb 15.6 ... 13.2 but no anemia.   PLAN   GI will sign off.    ?  Timing for restart of assay and starting Plavix?    Okay to have patient switch to a heart healthy diet, orders placed.  Orders are for soft diet.    Azucena Freed  04/21/2022, 1:31 PM Phone [phone]    Attending physician's note   I have taken a history, reviewed the chart and examined the patient. I performed a substantive portion of this encounter, including complete performance of at least one of the key components, in conjunction with the APP. I agree with the APP's note, impression and recommendations.    No further bleeding  Okay to resume Plavix in 2 days  Continue with soft diet for  additional 1 to 2 weeks GI follow-up as needed Okay to discharge home from GI standpoint, available if have any questions   The patient was provided an opportunity to ask questions and all were answered. The patient agreed with the plan and demonstrated an understanding of the instructions.   Damaris Hippo , MD [phone]

## 2022-04-21 NOTE — Progress Notes (Signed)
  Transition of Care [facility]) Screening Note   Patient Details  Name: Niraj Kudrna Date of Birth: 1961-02-06   Transition of Care [facility]) CM/SW Contact:    Tom-Johnson, Renea Ee, RN Phone Number: 04/21/2022, 1:58 PM  Transition of Care Department [facility]) has reviewed patient and no TOC needs or recommendations have been identified at this time. TOC will continue to monitor patient advancement through interdisciplinary progression rounds. If new patient transition needs arise, please place a TOC consult.

## 2022-04-24 NOTE — Discharge Summary (Signed)
Physician Discharge Summary   Patient: Kevin Wallace MRN: [id number] DOB: [birth year]  Admit date:     04/19/2022  Discharge date: 04/21/2022  Discharge Physician: Berle Mull  PCP: Ginger Organ., MD  Recommendations at discharge: Follow-up with PCP and GI Follow-up with cardiology   Follow-up Information     Ginger Organ., MD. Schedule an appointment as soon as possible for a visit in 1 week(s).   Specialty: Internal Medicine Why: with cbc Contact information: [place] [address] [phone]         Lorretta Harp, MD. Schedule an appointment as soon as possible for a visit in 1 month(s).   Specialties: Cardiology, Radiology Contact information: [address] [place] [address] [phone]                Discharge Diagnoses: Principal Problem:   Lower GI bleed Active Problems:   Prostate cancer [facility])   Hyperlipidemia   Obstructive sleep apnea   Coronary artery disease  Hospital Course: 61 y.o. male with medical history significant of CAD s/p stent in 08/2021, HLD, OSA on CPAP, and prostate CA presenting with hematochezia.  CT showed severe diverticulosis with focal active extravasation at sigmoid. IR recommendation for no acute intervention.  GI and cardiology team were consulted.  Cardiology recommended discontinuing dual antiplatelet therapy and eventually resuming only Plavix.   Assessment and Plan  Bright red blood per rectum, diverticular bleed CTA showed active diverticular bleed, IR did not recommend any acute intervention and recommended reversal of anticoagulation.  Case discussed with cardiology and gastroenterology.   It was decided to stop his dual antiplatelet therapy aspirin and Brilinta.   Eventually will plan to resume only Plavix as monotherapy.   GI recommended no indication for colonoscopy. No further bleeding seen in the hospital.  Hemoglobin remained stable.  Patient remained  hemodynamically stable without any symptoms. GI recommended patient to be discharged home with resumption of Plavix 3 days after. Patient was educated to monitor for signs and symptoms of acute bleeding.   CAD stent is post PCI January 2023 Seen by cardiology, they are comfortable with discontinuing dual antiplatelet therapy as patient is about 6 months out of his PCI and eventually resuming only Plavix.   HLD Crestor   OSA  CPAP   Prostate CA  Status post prostatectomy   Consultants:  Gastroenterology  Cardiology   Procedures performed:  none  DISCHARGE MEDICATION: Allergies as of 04/21/2022       Reactions   Colchicine Diarrhea, Nausea And Vomiting        Medication List     STOP taking these medications    aspirin EC 81 MG tablet   ticagrelor 90 MG Tabs tablet Commonly known as: BRILINTA       TAKE these medications    allopurinol 300 MG tablet Commonly known as: ZYLOPRIM Take 300 mg by mouth daily.   clopidogrel 75 MG tablet Commonly known as: Plavix Take 1 tablet (75 mg total) by mouth daily.   nitroGLYCERIN 0.4 MG SL tablet Commonly known as: NITROSTAT Place 1 tablet (0.4 mg total) under the tongue every 5 (five) minutes x 3 doses as needed for chest pain.   rosuvastatin 40 MG tablet Commonly known as: CRESTOR Take 1 tablet (40 mg total) by mouth daily. What changed: when to take this       Disposition: Home Diet recommendation: Cardiac diet  Discharge Exam: Vitals:   04/20/22 1658 04/20/22 2059  04/21/22 0445 04/21/22 1021  BP: 111/79 118/69 116/60 112/68  Pulse: (!) 51 (!) 50 (!) 52 (!) 48  Resp: '18 18 18 18  '$ Temp: 97.7 F (36.5 C) 98.4 F (36.9 C) (!) 97.2 F (36.2 C) 98.1 F (36.7 C)  TempSrc:   Oral Oral  SpO2: 98% 97% 93% 93%  Weight:      Height:       General: Appear in no distress; no visible Abnormal Neck Mass Or lumps, Conjunctiva normal Cardiovascular: S1 and S2 Present, no Murmur, Respiratory: good respiratory  effort, Bilateral Air entry present and CTA, no Crackles, no wheezes Abdomen: Bowel Sound present, Non tender  Extremities: no Pedal edema Neurology: alert and oriented to time, place, and person  Filed Weights   04/20/22 1205  Weight: 86.7 kg   Condition at discharge: stable  The results of significant diagnostics from this hospitalization (including imaging, microbiology, ancillary and laboratory) are listed below for reference.   Imaging Studies: CT ANGIO GI BLEED  Result Date: 04/19/2022 CLINICAL DATA:  Bloody stool. EXAM: CTA ABDOMEN AND PELVIS WITHOUT AND WITH CONTRAST TECHNIQUE: Multidetector CT imaging of the abdomen and pelvis was performed using the standard protocol during bolus administration of intravenous contrast. Multiplanar reconstructed images and MIPs were obtained and reviewed to evaluate the vascular anatomy. RADIATION DOSE REDUCTION: This exam was performed according to the departmental dose-optimization program which includes automated exposure control, adjustment of the mA and/or kV according to patient size and/or use of iterative reconstruction technique. CONTRAST:  143m OMNIPAQUE IOHEXOL 350 MG/ML SOLN COMPARISON:  None Available. FINDINGS: VASCULAR Aorta: Atherosclerosis of thoracic aorta is noted without aneurysm formation. There appears to be a large atherosclerotic ulcer in the infrarenal abdominal aorta. Celiac: Patent without evidence of aneurysm, dissection, vasculitis or significant stenosis. SMA: Severe focal stenosis is noted in the proximal portion without thrombus. Renals: Both renal arteries are patent without evidence of aneurysm, dissection, vasculitis, fibromuscular dysplasia or significant stenosis. IMA: Patent without evidence of aneurysm, dissection, vasculitis or significant stenosis. Inflow: Patent without evidence of aneurysm, dissection, vasculitis or significant stenosis. Proximal Outflow: Bilateral common femoral and visualized portions of the  superficial and profunda femoral arteries are patent without evidence of aneurysm, dissection, vasculitis or significant stenosis. Veins: No obvious venous abnormality within the limitations of this arterial phase study. Review of the MIP images confirms the above findings. NON-VASCULAR Lower chest: No acute abnormality. Hepatobiliary: No focal liver abnormality is seen. No gallstones, gallbladder wall thickening, or biliary dilatation. Pancreas: Unremarkable. No pancreatic ductal dilatation or surrounding inflammatory changes. Spleen: Normal in size without focal abnormality. Adrenals/Urinary Tract: Adrenal glands appear normal. No hydronephrosis or renal obstruction is noted. No renal or ureteral calculi are noted. Urinary bladder is unremarkable. Stomach/Bowel: The stomach and appendix are unremarkable. There is no evidence of bowel obstruction or inflammation. Stool is noted throughout the colon. Diverticulosis of descending and sigmoid colon is noted. There is noted active contrast extravasation in the proximal sigmoid colon concerning for active gastrointestinal hemorrhage. This is best seen on image number 67 of series 16. Lymphatic: No adenopathy is noted. Reproductive: Status post prostatectomy. Other: No abdominal wall hernia or abnormality. No abdominopelvic ascites. Musculoskeletal: No acute or significant osseous findings. IMPRESSION: VASCULAR There is noted active contrast extravasation in the proximal sigmoid colon concerning for active gastrointestinal hemorrhage. Critical Value/emergent results were called by telephone at the time of interpretation on 04/19/2022 at 1:06 pm to provider RGodfrey Pick, who verbally acknowledged these results. Atherosclerosis of infrarenal  abdominal aorta is noted with probable large atherosclerotic ulcer. Severe focal stenosis is seen involving proximal portion of superior mesenteric artery. NON-VASCULAR Extensive diverticulosis is seen involving the descending and sigmoid  colon, with stool noted throughout the colon. Aortic Atherosclerosis (ICD10-I70.0). Electronically Signed   By: Marijo Conception M.D.   On: 04/19/2022 13:06    Microbiology: Results for orders placed or performed during the hospital encounter of 08/30/21  Resp Panel by RT-PCR (Flu A&B, Covid) Nasopharyngeal Swab     Status: None   Collection Time: 08/30/21  5:16 PM   Specimen: Nasopharyngeal Swab; Nasopharyngeal(NP) swabs in vial transport medium  Result Value Ref Range Status   SARS Coronavirus 2 by RT PCR NEGATIVE NEGATIVE Final    Comment: (NOTE) SARS-CoV-2 target nucleic acids are NOT DETECTED.  The SARS-CoV-2 RNA is generally detectable in upper respiratory specimens during the acute phase of infection. The lowest concentration of SARS-CoV-2 viral copies this assay can detect is 138 copies/mL. A negative result does not preclude SARS-Cov-2 infection and should not be used as the sole basis for treatment or other patient management decisions. A negative result may occur with  improper specimen collection/handling, submission of specimen other than nasopharyngeal swab, presence of viral mutation(s) within the areas targeted by this assay, and inadequate number of viral copies(<138 copies/mL). A negative result must be combined with clinical observations, patient history, and epidemiological information. The expected result is Negative.  Fact Sheet for Patients:  EntrepreneurPulse.com.au  Fact Sheet for Healthcare Providers:  IncredibleEmployment.be  This test is no t yet approved or cleared by the [place] FDA and  has been authorized for detection and/or diagnosis of SARS-CoV-2 by FDA under an Emergency Use Authorization (EUA). This EUA will remain  in effect (meaning this test can be used) for the duration of the COVID-19 declaration under Section 564(b)(1) of the Act, 21 U.S.C.section 360bbb-3(b)(1), unless the authorization is  terminated  or revoked sooner.       Influenza A by PCR NEGATIVE NEGATIVE Final   Influenza B by PCR NEGATIVE NEGATIVE Final    Comment: (NOTE) The Xpert Xpress SARS-CoV-2/FLU/RSV plus assay is intended as an aid in the diagnosis of influenza from Nasopharyngeal swab specimens and should not be used as a sole basis for treatment. Nasal washings and aspirates are unacceptable for Xpert Xpress SARS-CoV-2/FLU/RSV testing.  Fact Sheet for Patients: EntrepreneurPulse.com.au  Fact Sheet for Healthcare Providers: IncredibleEmployment.be  This test is not yet approved or cleared by the [place] FDA and has been authorized for detection and/or diagnosis of SARS-CoV-2 by FDA under an Emergency Use Authorization (EUA). This EUA will remain in effect (meaning this test can be used) for the duration of the COVID-19 declaration under Section 564(b)(1) of the Act, 21 U.S.C. section 360bbb-3(b)(1), unless the authorization is terminated or revoked.  Performed at [facility], [place] [address] [place], [place] [address]    Labs: CBC: Recent Labs  Lab 04/19/22 0720 04/19/22 1145 04/20/22 0301 04/20/22 1606 04/21/22 1006  WBC 7.3 7.1 10.3 6.1 6.6  HGB 15.6 15.8 14.4 13.5 13.2  HCT 47.0 47.0 41.9 39.6 38.6*  MCV 92.9 92.5 92.3 92.3 91.9  PLT 296 300 271 276 867   Basic Metabolic Panel: Recent Labs  Lab 04/19/22 0720 04/20/22 0301 04/21/22 1006  NA 137 136 139  K 3.9 3.7 4.1  CL 104 103 103  CO2 '25 25 29  '$ GLUCOSE 111* 89 98  BUN '20 13 8  '$ CREATININE 1.07  0.91 1.01  CALCIUM 9.1 8.6* 8.9  MG  --   --  2.2   Liver Function Tests: Recent Labs  Lab 04/19/22 0720  AST 37  ALT 37  ALKPHOS 75  BILITOT 0.6  PROT 6.9  ALBUMIN 3.9   CBG: No results for input(s): "GLUCAP" in the last 168 hours.  Discharge time spent: greater than 30 minutes.  Signed: Berle Mull, MD [facility] 04/21/2022

## 2022-05-19 ENCOUNTER — Encounter: Payer: Self-pay | Admitting: Cardiovascular Disease

## 2022-05-21 MED ORDER — CLOPIDOGREL BISULFATE 75 MG PO TABS: 75.0000 mg | ORAL_TABLET | Freq: Every day | ORAL | 3 refills | Status: DC

## 2022-05-24 ENCOUNTER — Encounter: Payer: Self-pay | Admitting: Cardiovascular Disease

## 2022-05-24 ENCOUNTER — Ambulatory Visit
Payer: No Typology Code available for payment source | Attending: Cardiovascular Disease | Admitting: Cardiovascular Disease

## 2022-05-24 VITALS — BP 128/75 | HR 75 | Ht 71.0 in | Wt 195.0 lb

## 2022-05-24 DIAGNOSIS — G4733 Obstructive sleep apnea (adult) (pediatric): Secondary | ICD-10-CM

## 2022-05-24 DIAGNOSIS — K922 Gastrointestinal hemorrhage, unspecified: Secondary | ICD-10-CM

## 2022-05-24 DIAGNOSIS — E785 Hyperlipidemia, unspecified: Secondary | ICD-10-CM | POA: Diagnosis not present

## 2022-05-24 DIAGNOSIS — I214 Non-ST elevation (NSTEMI) myocardial infarction: Secondary | ICD-10-CM | POA: Diagnosis not present

## 2022-05-24 NOTE — Patient Instructions (Signed)
Follow-Up: At [facility], you and your health needs are our priority.  As part of our continuing mission to provide you with exceptional heart care, we have created designated Provider Care Teams.  These Care Teams include your primary Cardiologist (physician) and Advanced Practice Providers (APPs -  Physician Assistants and Nurse Practitioners) who all work together to provide you with the care you need, when you need it.  We recommend signing up for the patient portal called "MyChart".  Sign up information is provided on this After Visit Summary.  MyChart is used to connect with patients for Virtual Visits (Telemedicine).  Patients are able to view lab/test results, encounter notes, upcoming appointments, etc.  Non-urgent messages can be sent to your provider as well.   To learn more about what you can do with MyChart, go to [email or web address].    Your next appointment:   6 month(s)  The format for your next appointment:   In Person  Provider:   Dr. Claiborne Billings (sleep clinic)

## 2022-05-24 NOTE — Progress Notes (Signed)
Cardiology Office Note    Date:  05/24/2022   ID:  Kevin Wallace, DOB 02-Apr-1961, MRN [id number]  PCP:  Kevin Organ., MD  Cardiologist:  Kevin Majestic, MD (sleep); Dr. Gwenlyn Wallace  5 month F/U sleep evaluation   History of Present Illness:  Kevin Wallace is a 61 y.o. male who is followed by Dr. Gwenlyn Wallace for cardiology care.  I saw him for initial sleep evaluation on Dec 24, 2021 prior to initiating CPAP therapy.  He presents for a 88-month follow-up evaluation.   Kevin Wallace  suffered a non-ST segment elevation myocardial infarction in August 30, 2021 and cardiac catheterization revealed subtotal mid LAD stenosis with 50% ostial diagonal stenosis and scattered 20 to 30% lesions in other vessels.  He underwent successful PCI to his LAD with insertion of a 2.5 x 32 mm Synergy stent.  Subsequently, he has felt well and has been without recurrent anginal symptomatology.  He was seen by Kevin Sharp, PA on September 10, 2021 and due to complaints of snoring daytime sleepiness a sleep study was recommended.  He apparently was referred for a WatchPAT home sleep test utilizing peripheral arterial tonometry on September 17, 2021 which was interpreted by Dr. Radford Wallace.  He was Wallace to have mild overall sleep apnea with an AHI of 11.6.  Oxygen desaturated to a nadir of 81%.  He spent 17.6% of total sleep time and REM sleep but there is no data regarding the severity of his sleep apnea during REM sleep.  Apparently, he now presents to my schedule for further evaluation and treatment of his sleep sleep disordered breathing.  An Epworth Sleepiness Scale score was calculated in the office and this endorsed at 10 as shown below:   Epworth Sleepiness Scale: Situation   Chance of Dozing/Sleeping (0 = never , 1 = slight chance , 2 = moderate chance , 3 = high chance )   sitting and reading 1   watching TV 2   sitting inactive in a public place 0   being a passenger in a motor vehicle for an hour or more 2    lying down in the afternoon 3   sitting and talking to someone 0   sitting quietly after lunch (no alcohol) 2   while stopped for a few minutes in traffic as the driver 0   Total Score  10    He denied any bruxism, restless legs, hypnagogic or hypnopompic hallucinations or cataplectic events.  Mr. Kevin Wallace received a new ResMed AirSense 11 AutoSet CPAP unit on Jan 05, 2022 with [facility] as his DME company.  His initial 49-month download from May 23 to April 04, 2022 showed 86% of usage days, but he was noncompliant with usage greater than 4 hours.  Average use was only 2 hours and 8 minutes.  His pressure was set at a range of 7 to 18 cm of water with an EPR of 3.  His 95th percentile pressure was 10.5 with maximum average pressure of 11.  He had significant mask leak and his AHI was 5.1.  Apparently it appears that he had initiated therapy with a ResMed N30i mask and ultimately tried the F30i mask.  He believes he may have kept it very loose.  He can had significant leak.  He basically became frustrated and usually would take the mask off.  A more recent download from September 6 through October 5 showed only 1 day of use with  total duration at 8 minutes.  Apparently he still has his machine and was told by [facility] that his insurance allowed him to keep his machine.  Presently, he admits that he goes to bed late oftentimes at least midnight.  He often wakes up at 6 to 6:30 AM.  He has noted some occasional headache.  A new Epworth Sleepiness Scale was calculated in the office today and this endorsed at 8 argue against significant residual daytime sleepiness.  Presently, he continues to be on rosuvastatin for hyperlipidemia.  He is now on clopidogrel instead of Brilinta for antiplatelet therapy and is not on aspirin.  He apparently had a diverticular bleed resulting in the change.  He presents for follow-up evaluation.   Past Medical History:  Diagnosis Date   Allergy    CAD (coronary artery  disease)    s/p DES to mLAD 08/31/21   Cancer ([place]) 11/2019   s/p prostatectomy   Complication of anesthesia    took a long time to wear off    Gout    High cholesterol    Crestor    Past Surgical History:  Procedure Laterality Date   ANKLE ARTHROSCOPY     "had it cleaned out"   CARDIAC CATHETERIZATION     COLONOSCOPY  10/14/2014   KNEE ARTHROSCOPY  07/2019   KNEE ARTHROSCOPY  1980s   LEFT HEART CATH AND CORONARY ANGIOGRAPHY N/A 08/31/2021   Procedure: LEFT HEART CATH AND CORONARY ANGIOGRAPHY;  Surgeon: Burnell Blanks, MD;  Location: [facility];  Service: Cardiovascular;  Laterality: N/A;   LYMPHADENECTOMY Bilateral 02/28/2020   Procedure: LYMPHADENECTOMY, PELVIC;  Surgeon: Raynelle Bring, MD;  Location: [facility];  Service: Urology;  Laterality: Bilateral;   ROBOT ASSISTED LAPAROSCOPIC RADICAL PROSTATECTOMY N/A 02/28/2020   Procedure: XI ROBOTIC ASSISTED LAPAROSCOPIC RADICAL PROSTATECTOMY LEVEL 2;  Surgeon: Raynelle Bring, MD;  Location: [facility];  Service: Urology;  Laterality: N/A;    Current Medications: Outpatient Medications Prior to Visit  Medication Sig Dispense Refill   allopurinol (ZYLOPRIM) 300 MG tablet Take 300 mg by mouth daily.     clopidogrel (PLAVIX) 75 MG tablet Take 1 tablet (75 mg total) by mouth daily. 90 tablet 3   nitroGLYCERIN (NITROSTAT) 0.4 MG SL tablet Place 1 tablet (0.4 mg total) under the tongue every 5 (five) minutes x 3 doses as needed for chest pain. 25 tablet 12   rosuvastatin (CRESTOR) 40 MG tablet Take 1 tablet (40 mg total) by mouth daily. (Patient taking differently: Take 40 mg by mouth at bedtime.) 90 tablet 3   No facility-administered medications prior to visit.     Allergies:   Colchicine   Social History   Socioeconomic History   Marital status: Married    Spouse name: [name]   Number of children: 2   Years of education: 18   Highest education level: Master's degree (e.g., MA, MS, MEng, MEd, MSW, MBA)  Occupational  History   Occupation: desk work as a Freight forwarder  Tobacco Use   Smoking status: Never   Smokeless tobacco: Never  Scientific laboratory technician Use: Never used  Substance and Sexual Activity   Alcohol use: Not Currently   Drug use: No   Sexual activity: Yes    Birth control/protection: None  Other Topics Concern   Not on file  Social History Narrative   Lives with wife.     Social Determinants of Health   Financial Resource Strain: Not on file  Food Insecurity: No  Food Insecurity (04/20/2022)   Hunger Vital Sign    Worried About Running Out of Food in the Last Year: Never true    Ran Out of Food in the Last Year: Never true  Transportation Needs: No Transportation Needs (04/20/2022)   PRAPARE - Hydrologist (Medical): No    Lack of Transportation (Non-Medical): No  Physical Activity: Not on file  Stress: Not on file  Social Connections: Not on file     Family History:  The patient's family history includes Rheumatic fever (age of onset: 77) in his father.   ROS General: Negative; No fevers, chills, or night sweats;  HEENT: Negative; No changes in vision or hearing, sinus congestion, difficulty swallowing Pulmonary: Negative; No cough, wheezing, shortness of breath, hemoptysis Cardiovascular: See HPI GI: History of diverticular bleed GU: Negative; No dysuria, hematuria, or difficulty voiding Musculoskeletal: Negative; no myalgias, joint pain, or weakness Hematologic/Oncology: Negative; no easy bruising, bleeding Endocrine: Negative; no heat/cold intolerance; no diabetes Neuro: Negative; no changes in balance, headaches Skin: Negative; No rashes or skin lesions Psychiatric: Negative; No behavioral problems, depression Sleep: snoring, daytime sleepiness, morning headaches, no  bruxism, restless legs, hypnogognic hallucinations, no cataplexy Other comprehensive 14 point system review is negative.   PHYSICAL EXAM:   VS:  BP 128/75 (BP Location: Left Arm,  Patient Position: Sitting, Cuff Size: Normal)   Pulse 75   Ht $R'5\' 11"'UV$  (1.803 m)   Wt 195 lb (88.5 kg)   BMI 27.20 kg/m     Repeat blood pressure by me was 122/76 repeat blood pressure by me was 116/68  Wt Readings from Last 3 Encounters:  05/24/22 195 lb (88.5 kg)  04/20/22 191 lb 2.2 oz (86.7 kg)  12/24/21 191 lb 6.4 oz (86.8 kg)    General: Alert, oriented, no distress.  Skin: normal turgor, no rashes, warm and dry HEENT: Normocephalic, atraumatic. Pupils equal round and reactive to light; sclera anicteric; extraocular muscles intact;  Nose without nasal septal hypertrophy Mouth/Parynx benign; Mallinpatti scale 3 Neck: No JVD, no carotid bruits; normal carotid upstroke Lungs: clear to ausculatation and percussion; no wheezing or rales Chest wall: without tenderness to palpitation Heart: PMI not displaced, RRR, s1 s2 normal, 1/6 systolic murmur, no diastolic murmur, no rubs, gallops, thrills, or heaves Abdomen: soft, nontender; no hepatosplenomehaly, BS+; abdominal aorta nontender and not dilated by palpation. Back: no CVA tenderness Pulses 2+ Musculoskeletal: full range of motion, normal strength, no joint deformities Extremities: no clubbing cyanosis or edema, Homan's sign negative  Neurologic: grossly nonfocal; Cranial nerves grossly wnl Psychologic: Normal mood and affect     Studies/Labs Reviewed:   I personally reviewed his most recent ECG from April 19, 2022 which showed sinus bradycardia at 47 bpm.  Dec 24, 2021 ECG (independently read by me): Sinus bradycardia at 48; no ectopy, normal intervals   Recent Labs:    Latest Ref Rng & Units 04/21/2022   10:06 AM 04/20/2022    3:01 AM 04/19/2022    7:20 AM  BMP  Glucose 70 - 99 mg/dL 98  89  111   BUN 6 - 20 mg/dL $Remove'8  13  20   'qAspMFF$ Creatinine 0.61 - 1.24 mg/dL 1.01  0.91  1.07   Sodium 135 - 145 mmol/L 139  136  137   Potassium 3.5 - 5.1 mmol/L 4.1  3.7  3.9   Chloride 98 - 111 mmol/L 103  103  104   CO2 22 - 32 mmol/L  29  25  25   Calcium 8.9 - 10.3 mg/dL 8.9  8.6  9.1         Latest Ref Rng & Units 04/19/2022    7:20 AM 12/01/2021   12:49 PM 09/05/2021   12:55 AM  Hepatic Function  Total Protein 6.5 - 8.1 g/dL 6.9  7.5  7.1   Albumin 3.5 - 5.0 g/dL 3.9  4.8  3.9   AST 15 - 41 U/L 37  49  41   ALT 0 - 44 U/L 37  53  43   Alk Phosphatase 38 - 126 U/L 75  102  68   Total Bilirubin 0.3 - 1.2 mg/dL 0.6  0.5  0.9   Bilirubin, Direct 0.00 - 0.40 mg/dL  0.15         Latest Ref Rng & Units 04/21/2022   10:06 AM 04/20/2022    4:06 PM 04/20/2022    3:01 AM  CBC  WBC 4.0 - 10.5 K/uL 6.6  6.1  10.3   Hemoglobin 13.0 - 17.0 g/dL 13.2  13.5  14.4   Hematocrit 39.0 - 52.0 % 38.6  39.6  41.9   Platelets 150 - 400 K/uL 263  276  271    Lab Results  Component Value Date   MCV 91.9 04/21/2022   MCV 92.3 04/20/2022   MCV 92.3 04/20/2022   No results Wallace for: "TSH" Lab Results  Component Value Date   HGBA1C 5.4 09/01/2021     BNP No results Wallace for: "BNP"  ProBNP No results Wallace for: "PROBNP"   Lipid Panel     Component Value Date/Time   CHOL 123 12/01/2021 1249   TRIG 119 12/01/2021 1249   HDL 44 12/01/2021 1249   CHOLHDL 2.8 12/01/2021 1249   CHOLHDL 3.3 09/01/2021 0404   VLDL 17 09/01/2021 0404   LDLCALC 58 12/01/2021 1249   LABVLDL 21 12/01/2021 1249     RADIOLOGY: No results Wallace.   Additional studies/ records that were reviewed today include:   I reviewed the patient's catheterization data, records of Dr. Alvester Chou, as well as his [facility] sleep evaluation.  09/17/2021 FINDINGS: 1. Mild Obstructive Sleep Apnea with AHI 11.6/hr. 2. No Central Sleep Apnea with pAHIc 1.8/hr. 3. Oxygen desaturations as low as 81%. 4. Minimal snoring was present. O2 sats were < 88% for 0.3 min. 5. Total sleep time was 5 hrs and 37 min. 6. 17.6% of total sleep time was spent in REM sleep. 7. Normal sleep onset latency at 16 min. 8. Shortened REM sleep onset latency at 55 min. 9. Total awakenings  were 12.   DIAGNOSIS: Mild Obstructive Sleep Apnea (G47.33)    ASSESSMENT:    1. Obstructive sleep apnea   2. NSTEMI (non-ST elevated myocardial infarction) ([place])   3. Dyslipidemia, goal LDL below 70   4. Lower GI bleed     PLAN:  Mr. Cohl "Rush Landmark" Richert is a 61 year old gentleman who has known CAD and suffered a non-ST segment elevation myocardial infarction on August 30, 2021 and was Wallace to have 99% mid LAD stenosis which was successfully stented.  There was mild concomitant CAD.  He has a history of snoring and had noticed nonrestorative sleep with daytime sleepiness.  He was referred for an [facility] sleep study which was suggestive of at least mild overall sleep apnea with an AHI of 11.6.  I do not have any data regarding the severity of sleep apnea during REM sleep.  Minimal snoring was noted and  O2 saturation less than 88% was only short duration of 0.3 minutes.  Sleep duration was only 5 hours and 37 minutes with a shortened REM sleep onset latency at 55 minutes.  At my initial evaluation, I had a long discussion with Mr. Tickner regarding sleep apnea and its potential adverse cardiovascular consequences if left untreated.  Presently, I reviewed his data with him in detail regarding his initiation of CPAP therapy.  He is noncompliant and when he was using the device over the initial 2 to 3 months, sleep duration with CPAP remains suboptimal at only slightly over 2 hours.  I again had a long discussion with him today and discussed implications of untreated sleep apnea with reference to potential blood pressure control, risk for nocturnal arrhythmias including atrial fibrillation, potential increased inflammation, insulin resistance, GERD, and particularly with his CAD history potential for nocturnal hypoxemia contributing to nocturnal ischemia.  He has tried both in 30i and F30i mask but it appears that he was uncertain as far as what pressure to put the mask on.  It appears that he has been  keeping it very loose leading to significant leak.  He would oftentimes stare and stay awake and finally decided to just take the mask off.  After much discussion today he wishes to retry treatment with more compliance.  I am slightly reducing his pressure settings to a range of 6 to 13 cm.  I discussed optimal sleep duration of approximately 7 and 9 hours or at least 7 to 8 hours with his age of 60.  Discussed the importance of exercise.  His blood pressure today is stable.  He continues to be on Plavix for antiplatelet therapy and apparently had a diverticular bleed while on Brilinta.  He continues to be on rosuvastatin 40 mg for hyperlipidemia.  He tells me he had soft plaque.  I have suggested the next time he sees Dr. Gwenlyn Wallace that he have an LP(a) checked particularly with risk for vulnerable plaque if his plaque was noncalcified.  He will be following up with Dr. Gwenlyn Wallace in several weeks.  I will see him in 6 months for sleep reevaluation or sooner as needed.    Medication Adjustments/Labs and Tests Ordered: Current medicines are reviewed at length with the patient today.  Concerns regarding medicines are outlined above.  Medication changes, Labs and Tests ordered today are listed in the Patient Instructions below. Patient Instructions  Follow-Up: At [facility], you and your health needs are our priority.  As part of our continuing mission to provide you with exceptional heart care, we have created designated Provider Care Teams.  These Care Teams include your primary Cardiologist (physician) and Advanced Practice Providers (APPs -  Physician Assistants and Nurse Practitioners) who all work together to provide you with the care you need, when you need it.  We recommend signing up for the patient portal called "MyChart".  Sign up information is provided on this After Visit Summary.  MyChart is used to connect with patients for Virtual Visits (Telemedicine).  Patients are able to view lab/test  results, encounter notes, upcoming appointments, etc.  Non-urgent messages can be sent to your provider as well.   To learn more about what you can do with MyChart, go to [email or web address].    Your next appointment:   6 month(s)  The format for your next appointment:   In Person  Provider:   Dr. Claiborne Billings (sleep clinic)        Signed, Kevin Majestic,  MD , Tomasa Hose Diplomate, American Board of Sleep Medicine  05/24/2022 9:10 [place] [address], [place], [place], [place]  [address] Phone: [phone]

## 2022-06-02 ENCOUNTER — Ambulatory Visit
Payer: No Typology Code available for payment source | Attending: Cardiovascular Disease | Admitting: Cardiovascular Disease

## 2022-06-02 ENCOUNTER — Encounter: Payer: Self-pay | Admitting: Cardiovascular Disease

## 2022-06-02 DIAGNOSIS — E782 Mixed hyperlipidemia: Secondary | ICD-10-CM

## 2022-06-02 DIAGNOSIS — G4733 Obstructive sleep apnea (adult) (pediatric): Secondary | ICD-10-CM | POA: Diagnosis not present

## 2022-06-02 DIAGNOSIS — I10 Essential (primary) hypertension: Secondary | ICD-10-CM

## 2022-06-02 DIAGNOSIS — I2510 Atherosclerotic heart disease of native coronary artery without angina pectoris: Secondary | ICD-10-CM

## 2022-06-02 NOTE — Assessment & Plan Note (Signed)
History of hyperlipidemia on statin therapy with lipid profile performed/18/23 revealing total cholesterol 123, LDL 58 and HDL 44.

## 2022-06-02 NOTE — Assessment & Plan Note (Signed)
History of CAD status post non-STEMI 08/30/2021.  Cardiac catheterization was performed by Dr. Angelena Form revealing 99% mid LAD stenosis after a diagonal branch which was successfully stented using a 2.5 mm x 33 mm long Synergy drug-eluting stent with an excellent result.  He had scattered nonobstructive disease otherwise with preserved ejection fraction.  He was on aspirin and Brilinta but unfortunately recently had a diverticular bleed.  Antiplatelet therapy was interrupted and restarted with monotherapy using clopidogrel alone.  He denies chest pain or shortness of breath.

## 2022-06-02 NOTE — Assessment & Plan Note (Signed)
History of obstructive sleep apnea followed by Dr. Claiborne Billings on CPAP which she apparently is not tolerating.  I have asked him to have Dr. Claiborne Billings weigh him regarding the inspire device.

## 2022-06-02 NOTE — Patient Instructions (Signed)
Medication Instructions:  Your physician recommends that you continue on your current medications as directed. Please refer to the Current Medication list given to you today.  *If you need a refill on your cardiac medications before your next appointment, please call your pharmacy*   Follow-Up: At [facility], you and your health needs are our priority.  As part of our continuing mission to provide you with exceptional heart care, we have created designated Provider Care Teams.  These Care Teams include your primary Cardiologist (physician) and Advanced Practice Providers (APPs -  Physician Assistants and Nurse Practitioners) who all work together to provide you with the care you need, when you need it.  We recommend signing up for the patient portal called "MyChart".  Sign up information is provided on this After Visit Summary.  MyChart is used to connect with patients for Virtual Visits (Telemedicine).  Patients are able to view lab/test results, encounter notes, upcoming appointments, etc.  Non-urgent messages can be sent to your provider as well.   To learn more about what you can do with MyChart, go to [email or web address].    Your next appointment:   12 month(s)  The format for your next appointment:   In Person  Provider:   Jonathan Berry, MD   

## 2022-06-02 NOTE — Assessment & Plan Note (Signed)
History of essential hypertension blood pressure measured today at 122/82.  He is not on antihypertensive medications.

## 2022-06-02 NOTE — Progress Notes (Signed)
06/02/2022 [name]   [birth year]  [id number]  Primary Physician Ginger Organ., MD Primary Cardiologist: Lorretta Harp MD Lupe Carney, [place]  HPI:  Kevin Wallace is a 61 y.o.  mildly overweight but fit appearing married Caucasian male father of 2 children who works as a Publishing copy (desk job).  I last saw him in the office 12/01/2021.  His cardiac risk factors notable for just hyperlipidemia.  There is no family history for heart disease.  He had a non-STEMI on 08/30/2021 and underwent cardiac catheterization the following day by Dr. Angelena Form revealing a 99% mid LAD lesion after a diagonal branch which was successfully stented using a 2.5 mm x 33 mm long [place] drug-eluting stent with an excellent result.  He had scattered nonobstructive disease otherwise with preserved ejection fraction.  He was discharged home the following day on aspirin and Brilinta.  He has had 1 nosebleed but none since.  He complains of easy bruising.  He apparently also had a recent home sleep study which was positive.  Interestingly, he did have a coronary calcium score performed by his PCP 09/05/2020 which was low (6) with a small amount of calcium in the LAD territory.  He is fairly active and plays golf and walks.  Since I saw him 6 months ago he was admitted with a diverticular bleed.  His aspirin and Brilinta were held and clopidogrel was restarted several days later.  Apparently his hemoglobin has remained stable.  Otherwise, he is remained completely asymptomatic.   Current Meds  Medication Sig   allopurinol (ZYLOPRIM) 300 MG tablet Take 300 mg by mouth daily.   clopidogrel (PLAVIX) 75 MG tablet Take 1 tablet (75 mg total) by mouth daily.   nitroGLYCERIN (NITROSTAT) 0.4 MG SL tablet Place 1 tablet (0.4 mg total) under the tongue every 5 (five) minutes x 3 doses as needed for chest pain.   rosuvastatin (CRESTOR) 40 MG tablet Take 1 tablet (40 mg total) by mouth  daily. (Patient taking differently: Take 40 mg by mouth at bedtime.)     Allergies  Allergen Reactions   Colchicine Diarrhea and Nausea And Vomiting    Social History   Socioeconomic History   Marital status: Married    Spouse name: [name]   Number of children: 2   Years of education: 18   Highest education level: Master's degree (e.g., MA, MS, MEng, MEd, MSW, MBA)  Occupational History   Occupation: desk work as a Freight forwarder  Tobacco Use   Smoking status: Never   Smokeless tobacco: Never  Scientific laboratory technician Use: Never used  Substance and Sexual Activity   Alcohol use: Not Currently   Drug use: No   Sexual activity: Yes    Birth control/protection: None  Other Topics Concern   Not on file  Social History Narrative   Lives with wife.     Social Determinants of Health   Financial Resource Strain: Not on file  Food Insecurity: No Food Insecurity (04/20/2022)   Hunger Vital Sign    Worried About Running Out of Food in the Last Year: Never true    Ran Out of Food in the Last Year: Never true  Transportation Needs: No Transportation Needs (04/20/2022)   PRAPARE - Hydrologist (Medical): No    Lack of Transportation (Non-Medical): No  Physical Activity: Not on file  Stress: Not on file  Social Connections: Not on file  Intimate Partner Violence: Not At Risk (04/20/2022)   Humiliation, Afraid, Rape, and Kick questionnaire    Fear of Current or Ex-Partner: No    Emotionally Abused: No    Physically Abused: No    Sexually Abused: No     Review of Systems: General: negative for chills, fever, night sweats or weight changes.  Cardiovascular: negative for chest pain, dyspnea on exertion, edema, orthopnea, palpitations, paroxysmal nocturnal dyspnea or shortness of breath Dermatological: negative for rash Respiratory: negative for cough or wheezing Urologic: negative for hematuria Abdominal: negative for nausea, vomiting, diarrhea, bright red blood per  rectum, melena, or hematemesis Neurologic: negative for visual changes, syncope, or dizziness All other systems reviewed and are otherwise negative except as noted above.    Blood pressure 122/82, pulse 65, height 6' (1.829 m), weight 192 lb 12.8 oz (87.5 kg), SpO2 97 %.  General appearance: alert and no distress Neck: no adenopathy, no carotid bruit, no JVD, supple, symmetrical, trachea midline, and thyroid not enlarged, symmetric, no tenderness/mass/nodules Lungs: clear to auscultation bilaterally Heart: regular rate and rhythm, S1, S2 normal, no murmur, click, rub or gallop Extremities: extremities normal, atraumatic, no cyanosis or edema Pulses: 2+ and symmetric Skin: Skin color, texture, turgor normal. No rashes or lesions Neurologic: Grossly normal  EKG not performed today  ASSESSMENT AND PLAN:   Coronary artery disease History of CAD status post non-STEMI 08/30/2021.  Cardiac catheterization was performed by Dr. Angelena Form revealing 99% mid LAD stenosis after a diagonal branch which was successfully stented using a 2.5 mm x 33 mm long [place] drug-eluting stent with an excellent result.  He had scattered nonobstructive disease otherwise with preserved ejection fraction.  He was on aspirin and Brilinta but unfortunately recently had a diverticular bleed.  Antiplatelet therapy was interrupted and restarted with monotherapy using clopidogrel alone.  He denies chest pain or shortness of breath.  HTN (hypertension) History of essential hypertension blood pressure measured today at 122/82.  He is not on antihypertensive medications.  Obstructive sleep apnea History of obstructive sleep apnea followed by Dr. Claiborne Billings on CPAP which she apparently is not tolerating.  I have asked him to have Dr. Claiborne Billings weigh him regarding the inspire device.  Hyperlipidemia History of hyperlipidemia on statin therapy with lipid profile performed/18/23 revealing total cholesterol 123, LDL 58 and HDL  44.     Lorretta Harp MD FACP,FACC,FAHA, [facility] 06/02/2022 11:06 AM

## 2022-06-05 ENCOUNTER — Telehealth: Payer: Self-pay | Admitting: Physician Assistant

## 2022-06-05 NOTE — Telephone Encounter (Addendum)
    Patient called in to on call service to report one episode of chest "discomfort" while working out in the yard. He did take 1 SL NTG with no issues. He was admitted in 08/2021 with NSTEMI and underwent PCI to mLAD. He was discharged on aspirin and Brillinta. Unfortunately, he was subsequently readmitted with a diverticular bleed and changed to Plavix. He has had no further issues with bleeding. Chest pain today was nothing like when he came in in Jan with ACS. BP was mildly elevated during episode but improved to normal after taking nitro.   I reassured him that as long as chest pain is an isolated event responding to SL NTG and not increasing in severity or frequency that he should be very stable. Should his symptoms worsen, he will call us back to be seen for stress testing. If having rest pain or not responding to SL NTG, he will come into ER immediately. Will CC Dr. Gwenlyn Found as a FYI.    Angelena Form PA-C  [facility]

## 2022-06-26 ENCOUNTER — Other Ambulatory Visit: Payer: Self-pay | Admitting: Physician Assistant

## 2022-07-03 ENCOUNTER — Encounter: Payer: Self-pay | Admitting: Cardiovascular Disease

## 2022-07-05 ENCOUNTER — Other Ambulatory Visit (INDEPENDENT_AMBULATORY_CARE_PROVIDER_SITE_OTHER): Payer: No Typology Code available for payment source

## 2022-07-05 ENCOUNTER — Encounter (HOSPITAL_BASED_OUTPATIENT_CLINIC_OR_DEPARTMENT_OTHER): Payer: Self-pay | Admitting: Family

## 2022-07-05 ENCOUNTER — Telehealth: Payer: Self-pay | Admitting: Cardiovascular Disease

## 2022-07-05 ENCOUNTER — Ambulatory Visit (INDEPENDENT_AMBULATORY_CARE_PROVIDER_SITE_OTHER): Payer: No Typology Code available for payment source | Admitting: Family

## 2022-07-05 VITALS — BP 128/80 | HR 52 | Ht 71.0 in | Wt 197.7 lb

## 2022-07-05 DIAGNOSIS — I25118 Atherosclerotic heart disease of native coronary artery with other forms of angina pectoris: Secondary | ICD-10-CM

## 2022-07-05 DIAGNOSIS — I10 Essential (primary) hypertension: Secondary | ICD-10-CM

## 2022-07-05 DIAGNOSIS — G4733 Obstructive sleep apnea (adult) (pediatric): Secondary | ICD-10-CM

## 2022-07-05 DIAGNOSIS — E785 Hyperlipidemia, unspecified: Secondary | ICD-10-CM | POA: Diagnosis not present

## 2022-07-05 DIAGNOSIS — R55 Syncope and collapse: Secondary | ICD-10-CM | POA: Diagnosis not present

## 2022-07-05 NOTE — Telephone Encounter (Signed)
Spoke to patient he stated he had a episode of chest pain and light headed this past Sat while driving to [place].Stated he pulled off side of road and ask wife to drive.Stated he took a NTG and passed out.Wife called 42 and was taken to [facility] hospital.Stated he was told to see cardiologist soon.Appointment scheduled with Laurann Montana NP this morning at 11:20 AM.

## 2022-07-05 NOTE — Telephone Encounter (Signed)
See previous mychart note.

## 2022-07-05 NOTE — Progress Notes (Signed)
Office Visit    Patient Name: Kevin Wallace Date of Encounter: 07/05/2022  PCP:  Ginger Organ., MD    [facility]  Cardiologist:  Quay Burow, MD  Advanced Practice Provider:  No care team member to display Electrophysiologist:  None   Chief Complaint    Kevin Wallace is a 61 y.o. male presents today for chest pain, syncope   Past Medical History    Past Medical History:  Diagnosis Date   Allergy    CAD (coronary artery disease)    s/p DES to mLAD 08/31/21   Cancer ([place]) 11/2019   s/p prostatectomy   Complication of anesthesia    took a long time to wear off    Gout    High cholesterol    Crestor   Past Surgical History:  Procedure Laterality Date   ANKLE ARTHROSCOPY     "had it cleaned out"   CARDIAC CATHETERIZATION     COLONOSCOPY  10/14/2014   KNEE ARTHROSCOPY  07/2019   KNEE ARTHROSCOPY  1980s   LEFT HEART CATH AND CORONARY ANGIOGRAPHY N/A 08/31/2021   Procedure: LEFT HEART CATH AND CORONARY ANGIOGRAPHY;  Surgeon: Burnell Blanks, MD;  Location: [place] [facility];  Service: Cardiovascular;  Laterality: N/A;   LYMPHADENECTOMY Bilateral 02/28/2020   Procedure: LYMPHADENECTOMY, PELVIC;  Surgeon: Raynelle Bring, MD;  Location: [facility];  Service: Urology;  Laterality: Bilateral;   ROBOT ASSISTED LAPAROSCOPIC RADICAL PROSTATECTOMY N/A 02/28/2020   Procedure: XI ROBOTIC ASSISTED LAPAROSCOPIC RADICAL PROSTATECTOMY LEVEL 2;  Surgeon: Raynelle Bring, MD;  Location: [facility];  Service: Urology;  Laterality: N/A;    Allergies  Allergies  Allergen Reactions   Colchicine Diarrhea and Nausea And Vomiting    History of Present Illness    Kevin Wallace is a 61 y.o. male with a hx of hyperlipidemia, CAD s/p NSTEMI 08/30/2021 with DES-mid LAD, hyper diverticular bleed, OSA last seen 06/02/22.  Prior coronary calcium score by PCP 09/05/2020 with coronary calcium score of 6 small amount of calcification in the LAD.  Presented with  NSTEMI 08/30/2021 with cardiac catheterization revealing 99% mid LAD lesion after diagonal branch which successfully stented.  Otherwise scattered nonobstructive disease with preserved LVEF.  Discharged on aspirin, Brilinta.  He did have diverticular bleed 04/2022 requiring admission and was transition to Plavix alone.  He was last seen 06/02/2022 doing overall well from cardiac perspective.  Was having difficulty tolerating CPAP and was recommended to discuss inspire device with Dr. Claiborne Billings.  He presents today after contacting the office noting an episode of chest pain leading him to take a nitroglycerin with subsequent syncope review.  Evaluated by outside hospital  Notes episodes of lightheadedness and feeling head feels "unclear". This has been ongoing for about a month. Reports as a constant feeling. Drinks 2 glasses of water per day and snacks, no formal meal routine. Had had two episodes  with loss of consciousness 2-3 minutes. Both with nitroglycerin prior. Notes was unable to see during one episode.  Also with midsternal piercing chest discomfort different than anginal equivalent. Discomfort occurs both at rest and with activity. BP at home 130s/80s. More recently 140s-150s/80s. No hypotension at home. Heart rate in the 50s routinely at home. Notes fatigue over last few months. Not feeling well, not sleeping well. Does note not using CPAP due to difficulty with mask.   EKGs/Labs/Other Studies Reviewed:   The following studies were reviewed today:  LHC 08/31/21    Prox RCA lesion is  20% stenosed.   Mid RCA lesion is 20% stenosed.   Ost Cx to Prox Cx lesion is 30% stenosed.   1st Mrg lesion is 30% stenosed.   2nd Diag lesion is 50% stenosed.   Mid LAD lesion is 99% stenosed.   A drug-eluting stent was successfully placed using a SYNERGY XD 2.50X32.   Post intervention, there is a 0% residual stenosis.   The left ventricular systolic function is normal.   LV end diastolic pressure is normal.    The left ventricular ejection fraction is 50-55% by visual estimate.   There is no mitral valve regurgitation.   Severe mid LAD stenosis just beyond a moderate caliber diagonal branch. The diagonal branch has moderate ostial stenosis.  Successful PTCA/DES x 1 mid LAD Mild proximal Circumflex stenosis Large dominant RCA with mild proximal and mild distal stenosis Preserved LV systolic function with hypokinesis of the apex.    Recommendations: Will continue DAPT with ASA and Brilinta for one year. Continue statin. No beta blocker currently secondary to bradycardia. Echo in the am.    Diagnostic Dominance: Right  Intervention      Echo 09/01/21  1. Left ventricular ejection fraction, by estimation, is 50 to 55%. The  left ventricle has low normal function. The left ventricle demonstrates  regional wall motion abnormalities (see scoring diagram/findings for  description). Left ventricular diastolic   parameters were normal.   2. Right ventricular systolic function is normal. The right ventricular  size is normal. Tricuspid regurgitation signal is inadequate for assessing  PA pressure.   3. The mitral valve is grossly normal. Trivial mitral valve  regurgitation. No evidence of mitral stenosis.   4. The aortic valve is tricuspid. There is mild calcification of the  aortic valve. Aortic valve regurgitation is not visualized. Aortic valve  sclerosis is present, with no evidence of aortic valve stenosis.   5. The inferior vena cava is normal in size with greater than 50%  respiratory variability, suggesting right atrial pressure of 3 mmHg  EKG:  EKG is ordered today.  The ekg ordered today demonstrates SB 52 bpm with no acute ST/T wave changes.   Recent Labs: 04/19/2022: ALT 37 04/21/2022: BUN 8; Creatinine, Ser 1.01; Hemoglobin 13.2; Magnesium 2.2; Platelets 263; Potassium 4.1; Sodium 139  Recent Lipid Panel    Component Value Date/Time   CHOL 123 12/01/2021 1249   TRIG 119  12/01/2021 1249   HDL 44 12/01/2021 1249   CHOLHDL 2.8 12/01/2021 1249   CHOLHDL 3.3 09/01/2021 0404   VLDL 17 09/01/2021 0404   LDLCALC 58 12/01/2021 1249    Home Medications   Current Meds  Medication Sig   allopurinol (ZYLOPRIM) 300 MG tablet Take 300 mg by mouth daily.   clopidogrel (PLAVIX) 75 MG tablet Take 1 tablet (75 mg total) by mouth daily.   nitroGLYCERIN (NITROSTAT) 0.4 MG SL tablet Place 1 tablet (0.4 mg total) under the tongue every 5 (five) minutes x 3 doses as needed for chest pain.   rosuvastatin (CRESTOR) 40 MG tablet Take 1 tablet (40 mg total) by mouth at bedtime.     Review of Systems      All other systems reviewed and are otherwise negative except as noted above.  Physical Exam    VS:  BP 128/80   Pulse (!) 52   Ht '5\' 11"'$  (1.803 m)   Wt 197 lb 11.2 oz (89.7 kg)   SpO2 98%   BMI 27.57 kg/m  , BMI Body  mass index is 27.57 kg/m.  Wt Readings from Last 3 Encounters:  07/05/22 197 lb 11.2 oz (89.7 kg)  06/02/22 192 lb 12.8 oz (87.5 kg)  05/24/22 195 lb (88.5 kg)     GEN: Well nourished, well developed, in no acute distress. HEENT: normal. Neck: Supple, no JVD, carotid bruits, or masses. Cardiac: RRR, no murmurs, rubs, or gallops. No clubbing, cyanosis, edema.  Radials/PT 2+ and equal bilaterally.  Respiratory:  Respirations regular and unlabored, clear to auscultation bilaterally. GI: Soft, nontender, nondistended. MS: No deformity or atrophy. Skin: Warm and dry, no rash. Neuro:  Strength and sensation are intact. Psych: Normal affect.  Assessment & Plan    Syncope / Lightheadedness / Bradycardia - Occurred after taking nitroglycerin. ED workup at [facility] unremarkable per his report, have requested records. Poor PO intake, suspect orthostatic hypotension. Known bradycardia, not on AV nodal blocking agent. Echo 09/01/21 LVEF 50-55%, RV normal, aortic valve sclerosis without stenosis. Plan for carotid duplex, 14 day live telemetry monitor to rule  out carotid stenosis or arrhythmia. Education provided on orthostatic precautions: Stay well hydrated, eat regular meals, wear compression socks, make position changes slowly. If workup unremarkable, consider referral to neurology. Reviewed [place] DMV recommendations to not drive for 6 months.  Addendum 07/07/22: Received notes from [facility] 07/02/22. Na 137, K 5.5, creatinine 1.0, AST 31, ALT 29, Hb 16, troponin 8 (normal), troponin x2 and d-dimer negative. CXR nonacute. EKG SB 49 bpm with noa cute ST/T wave changes.   CAD s/p DES-LAD 08/30/2021- Occasional midsternal sharp chest discomfort at rest or with activity that self resolves after seconds. Different than anginal equivalent. Reiterated to take nitroglycerin for chest pain lasting > 5 minutes. GDMT aspirin, rosuvastatin. Will defer BB due to baseline bradycardia. Defer Amlodipine due to concern for hypotension, as above. Heart healthy diet and regular cardiovascular exercise encouraged.  Discussed possible stress test vs cardiac PET - as symptoms are overall atypical and EKG with no acute ST/T wave changes will defer today.  Discussed possible referral to Dr. Conception Chancy, PsyD for stress regarding cardiac conditions. He will contact us if interested in referral.   Hypertension- BP well controlled not on antihypertensive regimen. Concern for orthostasis, as above.  OSA- CPAP compliance encouraged. Follows with Dr. Claiborne Billings. He is agreeable to re-trial CPAP with taking Melatonin QHS for a few days to establish routine. Scheduled for follow up with Dr. Claiborne Billings in January.  HLD, LDL goal <70-11/2021 LDL 58. Continue Rosuvastatin '40mg'$  daily.         Disposition: Follow up in 1 month(s) with Quay Burow, MD or APP.  Signed, Loel Dubonnet, NP 07/05/2022, 12:05 PM [place]

## 2022-07-05 NOTE — Patient Instructions (Signed)
Medication Instructions:  Continue your current medications.   May try Melatonin 5-'10mg'$  thirty minutes prior to sleep to try to adjust to CPAP.   *If you need a refill on your cardiac medications before your next appointment, please call your pharmacy*  Lab Work: None ordered today.   Testing/Procedures:  Your physician has requested that you have a carotid duplex. This test is an ultrasound of the carotid arteries in your neck. It looks at blood flow through these arteries that supply the brain with blood. Allow one hour for this exam. There are no restrictions or special instructions.   Your physician has recommended that you wear a Zio monitor.   This monitor is a medical device that records the heart's electrical activity. Doctors most often use these monitors to diagnose arrhythmias. Arrhythmias are problems with the speed or rhythm of the heartbeat. The monitor is a small device applied to your chest. You can wear one while you do your normal daily activities. While wearing this monitor if you have any symptoms to push the button and record what you felt. Once you have worn this monitor for the period of time provider prescribed (Usually 14 days), you will return the monitor device in the postage paid box. Once it is returned they will download the data collected and provide [place] with a report which the provider will then review and we will call you with those results. Important tips:  Avoid showering during the first 24 hours of wearing the monitor. Avoid excessive sweating to help maximize wear time. Do not submerge the device, no hot tubs, and no swimming pools. Keep any lotions or oils away from the patch. After 24 hours you may shower with the patch on. Take brief showers with your back facing the shower head.  Do not remove patch once it has been placed because that will interrupt data and decrease adhesive wear time. Push the button when you have any symptoms and write down what  you were feeling. Once you have completed wearing your monitor, remove and place into box which has postage paid and place in your outgoing mailbox.  If for some reason you have misplaced your box then call our office and we can provide another box and/or mail it off for you.      Follow-Up: At [facility], you and your health needs are our priority.  As part of our continuing mission to provide you with exceptional heart care, we have created designated Provider Care Teams.  These Care Teams include your primary Cardiologist (physician) and Advanced Practice Providers (APPs -  Physician Assistants and Nurse Practitioners) who all work together to provide you with the care you need, when you need it.  We recommend signing up for the patient portal called "MyChart".  Sign up information is provided on this After Visit Summary.  MyChart is used to connect with patients for Virtual Visits (Telemedicine).  Patients are able to view lab/test results, encounter notes, upcoming appointments, etc.  Non-urgent messages can be sent to your provider as well.   To learn more about what you can do with MyChart, go to [email or web address].    Your next appointment:   1 month(s)  The format for your next appointment:   In Person  Provider:   Quay Burow, MD  or Advanced Practice Provider   Other Instructions  Heart Healthy Diet Recommendations: A low-salt diet is recommended. Meats should be grilled, baked, or boiled. Avoid fried foods. Focus on lean  protein sources like fish or chicken with vegetables and fruits. The American Heart Association is a Microbiologist!  American Heart Association Diet and Lifeystyle Recommendations   Exercise recommendations: The American Heart Association recommends 150 minutes of moderate intensity exercise weekly. Try 30 minutes of moderate intensity exercise 4-5 times per week. This could include walking, jogging, or swimming.   Orthostatic  Hypotension Blood pressure is a measurement of how strongly, or weakly, your circulating blood is pressing against the walls of your arteries. Orthostatic hypotension is a drop in blood pressure that can happen when you change positions, such as when you go from lying down to standing. Arteries are blood vessels that carry blood from your heart throughout your body. When blood pressure is too low, you may not get enough blood to your brain or to the rest of your organs. Orthostatic hypotension can cause light-headedness, sweating, rapid heartbeat, blurred vision, and fainting. These symptoms require further investigation into the cause. What are the causes? Orthostatic hypotension can be caused by many things, including: Sudden changes in posture, such as standing up quickly after you have been sitting or lying down. Loss of blood (anemia) or loss of body fluids (dehydration). Heart problems, neurologic problems, or hormone problems. Pregnancy. Aging. The risk for this condition increases as you get older. Severe infection (sepsis). Certain medicines, such as medicines for high blood pressure or medicines that make the body lose excess fluids (diuretics). What are the signs or symptoms? Symptoms of this condition may include: Weakness, light-headedness, or dizziness. Sweating. Blurred vision. Tiredness (fatigue). Rapid heartbeat. Fainting, in severe cases. How is this diagnosed? This condition is diagnosed based on: Your symptoms and medical history. Your blood pressure measurements. Your health care provider will check your blood pressure when you are: Lying down. Sitting. Standing. A blood pressure reading is recorded as two numbers, such as "120 over 80" (or 120/80). The first ("top") number is called the systolic pressure. It is a measure of the pressure in your arteries as your heart beats. The second ("bottom") number is called the diastolic pressure. It is a measure of the pressure  in your arteries when your heart relaxes between beats. Blood pressure is measured in a unit called mmHg. Healthy blood pressure for most adults is 120/80 mmHg. Orthostatic hypotension is defined as a 20 mmHg drop in systolic pressure or a 10 mmHg drop in diastolic pressure within 3 minutes of standing. Other information or tests that may be used to diagnose orthostatic hypotension include: Your other vital signs, such as your heart rate and temperature. Blood tests. An electrocardiogram (ECG) or echocardiogram. A Holter monitor. This is a device you wear that records your heart rhythm continuously, usually for 24-48 hours. Tilt table test. For this test, you will be safely secured to a table that moves you from a lying position to an upright position. Your heart rhythm and blood pressure will be monitored during the test. How is this treated? This condition may be treated by: Changing your diet. This may involve eating more salt (sodium) or drinking more water. Changing the dosage of certain medicines you are taking that might be lowering your blood pressure. Correcting the underlying reason for the orthostatic hypotension. Wearing compression stockings. Taking medicines to raise your blood pressure. Avoiding actions that trigger symptoms. Follow these instructions at home: Medicines Take over-the-counter and prescription medicines only as told by your health care provider. Follow instructions from your health care provider about changing the dosage of your current  medicines, if this applies. Do not stop or adjust any of your medicines on your own. Eating and drinking  Drink enough fluid to keep your urine pale yellow. Eat extra salt only as directed. Do not add extra salt to your diet unless advised by your health care provider. Eat frequent, small meals. Avoid standing up suddenly after eating. General instructions  Get up slowly from lying down or sitting positions. This gives your  blood pressure a chance to adjust. Avoid hot showers and excessive heat as directed by your health care provider. Engage in regular physical activity as directed by your health care provider. If you have compression stockings, wear them as told. Keep all follow-up visits. This is important. Contact a health care provider if: You have a fever for more than 2-3 days. You feel more thirsty than usual. You feel dizzy or weak. Get help right away if: You have chest pain. You have a fast or irregular heartbeat. You become sweaty or feel light-headed. You feel short of breath. You faint. You have any symptoms of a stroke. "BE FAST" is an easy way to remember the main warning signs of a stroke: B - Balance. Signs are dizziness, sudden trouble walking, or loss of balance. E - Eyes. Signs are trouble seeing or a sudden change in vision. F - Face. Signs are sudden weakness or numbness of the face, or the face or eyelid drooping on one side. A - Arms. Signs are weakness or numbness in an arm. This happens suddenly and usually on one side of the body. S - Speech. Signs are sudden trouble speaking, slurred speech, or trouble understanding what people say. T - Time. Time to call emergency services. Write down what time symptoms started. You have other signs of a stroke, such as: A sudden, severe headache with no known cause. Nausea or vomiting. Seizure. These symptoms may represent a serious problem that is an emergency. Do not wait to see if the symptoms will go away. Get medical help right away. Call your local emergency services (911 in the [place] Do not drive yourself to the hospital. Summary Orthostatic hypotension is a sudden drop in blood pressure. It can cause light-headedness, sweating, rapid heartbeat, blurred vision, and fainting. Orthostatic hypotension can be diagnosed by having your blood pressure taken while lying down, sitting, and then standing. Treatment may involve changing your  diet, wearing compression stockings, sitting up slowly, adjusting your medicines, or correcting the underlying reason for the orthostatic hypotension. Get help right away if you have chest pain, a fast or irregular heartbeat, or symptoms of a stroke. This information is not intended to replace advice given to you by your health care provider. Make sure you discuss any questions you have with your health care provider. Document Revised: 10/16/2020 Document Reviewed: 10/16/2020 Elsevier Patient Education  [place].

## 2022-07-05 NOTE — Telephone Encounter (Signed)
Pt wants to let Dr. Gwenlyn Found or RN know that he sent a mychart message and he wants to know if he should come in to be seen

## 2022-07-13 ENCOUNTER — Encounter (HOSPITAL_BASED_OUTPATIENT_CLINIC_OR_DEPARTMENT_OTHER): Payer: Self-pay

## 2022-07-26 ENCOUNTER — Ambulatory Visit (INDEPENDENT_AMBULATORY_CARE_PROVIDER_SITE_OTHER): Payer: No Typology Code available for payment source

## 2022-07-26 DIAGNOSIS — R55 Syncope and collapse: Secondary | ICD-10-CM

## 2022-08-03 ENCOUNTER — Telehealth (HOSPITAL_BASED_OUTPATIENT_CLINIC_OR_DEPARTMENT_OTHER): Payer: Self-pay

## 2022-08-03 DIAGNOSIS — R002 Palpitations: Secondary | ICD-10-CM

## 2022-08-03 NOTE — Telephone Encounter (Signed)
EP ref placed, patient aware they will reach out for scheduling.

## 2022-08-03 NOTE — Addendum Note (Signed)
Addended by: Gerald Stabs on: 08/03/2022 09:59 AM   Modules accepted: Orders

## 2022-08-03 NOTE — Telephone Encounter (Addendum)
Seen by patient Kevin Wallace on 08/02/2022  9:44 PM; orders placed and follow up mychart message sent to patient.    ----- Message from Loel Dubonnet, NP sent at 08/02/2022  9:40 PM EST ----- Monitor with predominantly normal sinus rhythm. No significant pauses nor atrial fibrillation. There were episodes of a fast heart beat called SVT (supraventricular tachycardia) which were very short. Of the 14 times this occurred he only pressed the button 3 of those times. Prevent from happening by staying well hydrated, avoiding caffeine, managing stress well.   Unable to add AV nodal blocking therapy due to bradycardia. If he is noting persistent palpitations, refer to EP. Otherwise, follow up as scheduled with Dr. Claiborne Billings.

## 2022-08-16 DIAGNOSIS — K551 Chronic vascular disorders of intestine: Secondary | ICD-10-CM

## 2022-08-16 HISTORY — DX: Chronic vascular disorders of intestine: K55.1

## 2022-08-19 ENCOUNTER — Encounter: Payer: Self-pay | Admitting: Family

## 2022-08-24 ENCOUNTER — Ambulatory Visit (HOSPITAL_COMMUNITY)
Admission: EM | Admit: 2022-08-24 | Discharge: 2022-08-24 | Disposition: A | Discharge status: Home / Self Care | Payer: No Typology Code available for payment source | Attending: Emergency Medicine | Admitting: Emergency Medicine

## 2022-08-24 ENCOUNTER — Encounter (HOSPITAL_COMMUNITY): Payer: Self-pay | Admitting: Emergency Medicine

## 2022-08-24 DIAGNOSIS — Z23 Encounter for immunization: Secondary | ICD-10-CM

## 2022-08-24 DIAGNOSIS — S61210A Laceration without foreign body of right index finger without damage to nail, initial encounter: Secondary | ICD-10-CM

## 2022-08-24 MED ORDER — SILVER NITRATE-POT NITRATE 75-25 % EX MISC
CUTANEOUS | Status: AC
  Filled 2022-08-24: qty 10

## 2022-08-24 MED ORDER — CEPHALEXIN 500 MG PO CAPS: 500.0000 mg | ORAL_CAPSULE | Freq: Two times a day (BID) | ORAL | 0 refills | Status: DC

## 2022-08-24 MED ORDER — TETANUS-DIPHTH-ACELL PERTUSSIS 5-2.5-18.5 LF-MCG/0.5 IM SUSY
0.5000 mL | PREFILLED_SYRINGE | Freq: Once | INTRAMUSCULAR | Status: AC
  Administered 2022-08-24: 0.5 mL via INTRAMUSCULAR

## 2022-08-24 MED ORDER — TETANUS-DIPHTH-ACELL PERTUSSIS 5-2.5-18.5 LF-MCG/0.5 IM SUSY
PREFILLED_SYRINGE | INTRAMUSCULAR | Status: AC
  Filled 2022-08-24: qty 0.5

## 2022-08-24 NOTE — ED Triage Notes (Signed)
Pt states he cut his right index finger on something in the basement when he was working.  States he is on plavix and it  was bleeding for 4 hours

## 2022-08-24 NOTE — Discharge Instructions (Addendum)
We updated your Tdap injection today.   Please keep compression bandage placed on finger until tomorrow.   I have given you a written prescription for an antibiotic, if you begin having increased redness, increased pain, streaking from the site of injury, or abnormal drainage, please start taking the antibiotic.   You may follow-up with this office if necessary.

## 2022-08-24 NOTE — ED Provider Notes (Signed)
[facility]    CSN: [id number] Arrival date & time: 08/24/22  1933      History   Chief Complaint Chief Complaint  Patient presents with   Laceration    HPI Kevin Wallace is a 62 y.o. male.  Patient presents due to laceration to right index finger approximately 4 hours ago.  He reports laceration occurred when he was in his basement doing some housework when he cut his finger on an unknown item. Patient reports that he is concerned due to constant bleeding since onset approximately 4 hours ago.  Patient reports he is attempted to place a compression bandage on his finger but his finger is still "oozing blood".  Patient reports that the area is throbbing.  He denies any numbness or tingling in his right upper extremity.  He reports that he takes a blood thinner, Plavix.  He is unaware as to when his last Tdap booster was.  Patient has family member at bedside.   Laceration   Past Medical History:  Diagnosis Date   Allergy    CAD (coronary artery disease)    s/p DES to mLAD 08/31/21   Cancer ([place]) 11/2019   s/p prostatectomy   Complication of anesthesia    took a long time to wear off    Gout    High cholesterol    Crestor    Patient Active Problem List   Diagnosis Date Noted   Lower GI bleed 04/19/2022   Coronary artery disease 04/19/2022   Diverticular hemorrhage    S/P drug eluting coronary stent placement    Hyperlipidemia 12/01/2021   Obstructive sleep apnea 12/01/2021   NSTEMI (non-ST elevated myocardial infarction) ([place]) 08/30/2021   HTN (hypertension) 08/30/2021   Prostate cancer ([place]) 02/28/2020   Chest pain 07/14/2015    Past Surgical History:  Procedure Laterality Date   ANKLE ARTHROSCOPY     "had it cleaned out"   CARDIAC CATHETERIZATION     COLONOSCOPY  10/14/2014   KNEE ARTHROSCOPY  07/2019   KNEE ARTHROSCOPY  1980s   LEFT HEART CATH AND CORONARY ANGIOGRAPHY N/A 08/31/2021   Procedure: LEFT HEART CATH AND CORONARY ANGIOGRAPHY;   Surgeon: Burnell Blanks, MD;  Location: [facility];  Service: Cardiovascular;  Laterality: N/A;   LYMPHADENECTOMY Bilateral 02/28/2020   Procedure: LYMPHADENECTOMY, PELVIC;  Surgeon: Raynelle Bring, MD;  Location: [facility];  Service: Urology;  Laterality: Bilateral;   ROBOT ASSISTED LAPAROSCOPIC RADICAL PROSTATECTOMY N/A 02/28/2020   Procedure: XI ROBOTIC ASSISTED LAPAROSCOPIC RADICAL PROSTATECTOMY LEVEL 2;  Surgeon: Raynelle Bring, MD;  Location: [facility];  Service: Urology;  Laterality: N/A;       Home Medications    Prior to Admission medications   Medication Sig Start Date End Date Taking? Authorizing Provider  cephALEXin (KEFLEX) 500 MG capsule Take 1 capsule (500 mg total) by mouth 2 (two) times daily. 08/24/22  Yes Flossie Dibble, NP  allopurinol (ZYLOPRIM) 300 MG tablet Take 300 mg by mouth daily. 11/28/19   [provider]  clopidogrel (PLAVIX) 75 MG tablet Take 1 tablet (75 mg total) by mouth daily. 05/21/22 05/21/23  Lorretta Harp, MD  nitroGLYCERIN (NITROSTAT) 0.4 MG SL tablet Place 1 tablet (0.4 mg total) under the tongue every 5 (five) minutes x 3 doses as needed for chest pain. 09/01/21   Bhagat, Crista Luria, PA  rosuvastatin (CRESTOR) 40 MG tablet Take 1 tablet (40 mg total) by mouth at bedtime. 06/28/22   [name], PA  Family History Family History  Problem Relation Age of Onset   Rheumatic fever Father 39       Died from complications of RF but might have been a heart attack   Colon cancer Neg Hx    Rectal cancer Neg Hx    Stomach cancer Neg Hx    Esophageal cancer Neg Hx     Social History Social History   Tobacco Use   Smoking status: Never   Smokeless tobacco: Never  Vaping Use   Vaping Use: Never used  Substance Use Topics   Alcohol use: Not Currently   Drug use: No     Allergies   Colchicine   Review of Systems Review of Systems Per HPI  Physical Exam Triage Vital Signs ED Triage Vitals  Enc Vitals  Group     BP 08/24/22 1945 (!) 178/84     Pulse Rate 08/24/22 1945 60     Resp 08/24/22 1945 16     Temp 08/24/22 1945 98 F (36.7 C)     Temp Source 08/24/22 1945 Oral     SpO2 08/24/22 1945 97 %     Weight --      Height --      Head Circumference --      Peak Flow --      Pain Score 08/24/22 1947 4     Pain Loc --      Pain Edu? --      Excl. in [place]? --    No data found.  Updated Vital Signs BP (!) 164/97 (BP Location: Left Arm)   Pulse 60   Temp 98 F (36.7 C) (Oral)   Resp 16   SpO2 97%       Physical Exam Vitals and nursing note reviewed.  Constitutional:      Appearance: Normal appearance.  Musculoskeletal:       Arms:     Comments: RT index finger: Approximately 1/2 cm in diameter of avulsed skin noted, bleeding present upon exam, no pulsation noted. Tenderness upon palpation.   Neurological:     Mental Status: He is alert.      UC Treatments / Results  Labs (all labs ordered are listed, but only abnormal results are displayed) Labs Reviewed - No data to display  EKG   Radiology No results found.  Procedures Laceration Repair  Date/Time: 08/24/2022 8:28 PM  Performed by: Flossie Dibble, NP Authorized by: Flossie Dibble, NP   Consent:    Consent obtained:  Verbal   Consent given by:  Patient   Risks, benefits, and alternatives were discussed: yes     Risks discussed:  Infection, pain and poor cosmetic result   Alternatives discussed:  Observation Universal protocol:    Procedure explained and questions answered to patient or proxy's satisfaction: yes     Patient identity confirmed:  Verbally with patient Anesthesia:    Anesthesia method:  None Laceration details:    Location:  Finger   Finger location:  R index finger   Length (cm):  0.5   Depth (mm):  1 Pre-procedure details:    Preparation:  Patient was prepped and draped in usual sterile fashion Exploration:    Hemostasis achieved with:  Cautery   Contaminated: no    Treatment:    Area cleansed with:  Saline and chlorhexidine   Amount of cleaning:  Extensive   Irrigation solution:  Sterile saline   Irrigation volume:  10 mL   Irrigation method:  Syringe   Visualized foreign bodies/material removed: no   Repair type:    Repair type:  Simple Post-procedure details:    Dressing:  Non-adherent dressing and adhesive bandage   Procedure completion:  Tolerated well, no immediate complications  (including critical care time)  Medications Ordered in UC Medications  Tdap (BOOSTRIX) injection 0.5 mL (0.5 mLs Intramuscular Given 08/24/22 2005)    Initial Impression / Assessment and Plan / UC Course  I have reviewed the triage vital signs and the nursing notes.  Pertinent labs & imaging results that were available during my care of the patient were reviewed by me and considered in my medical decision making (see chart for details).     Patient was evaluated for laceration of right index finger.  Patient was made aware that suturing did not appear necessary upon assessment.  Hemostasis occurred by cauterization.  Pressure dressing was applied after.  Patient was made aware of management at home and when follow-up will be necessary.  Written prescription for Keflex was given, patient was educated on signs of infection and when to start Keflex prescription if necessary.  Tdap injection was updated.  Patient verbalized understanding of instructions.   Charting was provided using a a verbal dictation system, charting was proofread for errors, errors may occur which could change the meaning of the information charted.   Final Clinical Impressions(s) / UC Diagnoses   Final diagnoses:  Laceration of right index finger without foreign body without damage to nail, initial encounter     Discharge Instructions      We updated your Tdap injection today.   Please keep compression bandage placed on finger until tomorrow.   I have given you a written prescription  for an antibiotic, if you begin having increased redness, increased pain, streaking from the site of injury, or abnormal drainage, please start taking the antibiotic.   You may follow-up with this office if necessary.      ED Prescriptions     Medication Sig Dispense Auth. Provider   cephALEXin (KEFLEX) 500 MG capsule Take 1 capsule (500 mg total) by mouth 2 (two) times daily. 10 capsule Flossie Dibble, NP      PDMP not reviewed this encounter.   Flossie Dibble, NP 08/24/22 2033

## 2022-08-25 ENCOUNTER — Other Ambulatory Visit: Payer: Self-pay | Admitting: Internal Medicine

## 2022-08-25 ENCOUNTER — Other Ambulatory Visit: Payer: Self-pay

## 2022-08-25 ENCOUNTER — Ambulatory Visit (HOSPITAL_COMMUNITY)
Admission: EM | Admit: 2022-08-25 | Discharge: 2022-08-25 | Disposition: A | Discharge status: Home / Self Care | Payer: No Typology Code available for payment source | Attending: Family Medicine | Admitting: Family Medicine

## 2022-08-25 ENCOUNTER — Encounter (HOSPITAL_COMMUNITY): Payer: Self-pay | Admitting: Emergency Medicine

## 2022-08-25 DIAGNOSIS — I7121 Aneurysm of the ascending aorta, without rupture: Secondary | ICD-10-CM

## 2022-08-25 DIAGNOSIS — T148XXA Other injury of unspecified body region, initial encounter: Secondary | ICD-10-CM | POA: Diagnosis not present

## 2022-08-25 NOTE — ED Provider Notes (Addendum)
[name]    CSN: [id number] Arrival date & time: 08/25/22  0998      History   Chief Complaint Chief Complaint  Patient presents with   Finger Injury    HPI Kevin Wallace is a 62 y.o. male.   HPI For continued bleeding from a finger wound he sustained yesterday.  He was seen here for an avulsion abrasion injury of the right index finger on the finger pad.  There was not a suturable wound.  He does take Plavix and there was some difficulty getting the injury to quit bleeding.  Last night pressure and silver nitrate cautery was used to obtain some hemostasis.  He was to return if he saw any bleeding when he change the bandage this morning.  Past Medical History:  Diagnosis Date   Allergy    CAD (coronary artery disease)    s/p DES to mLAD 08/31/21   Cancer ([place]) 11/2019   s/p prostatectomy   Complication of anesthesia    took a long time to wear off    Gout    High cholesterol    Crestor    Patient Active Problem List   Diagnosis Date Noted   Lower GI bleed 04/19/2022   Coronary artery disease 04/19/2022   Diverticular hemorrhage    S/P drug eluting coronary stent placement    Hyperlipidemia 12/01/2021   Obstructive sleep apnea 12/01/2021   NSTEMI (non-ST elevated myocardial infarction) ([place]) 08/30/2021   HTN (hypertension) 08/30/2021   Prostate cancer ([place]) 02/28/2020   Chest pain 07/14/2015    Past Surgical History:  Procedure Laterality Date   ANKLE ARTHROSCOPY     "had it cleaned out"   CARDIAC CATHETERIZATION     COLONOSCOPY  10/14/2014   KNEE ARTHROSCOPY  07/2019   KNEE ARTHROSCOPY  1980s   LEFT HEART CATH AND CORONARY ANGIOGRAPHY N/A 08/31/2021   Procedure: LEFT HEART CATH AND CORONARY ANGIOGRAPHY;  Surgeon: Burnell Blanks, MD;  Location: [facility];  Service: Cardiovascular;  Laterality: N/A;   LYMPHADENECTOMY Bilateral 02/28/2020   Procedure: LYMPHADENECTOMY, PELVIC;  Surgeon: Raynelle Bring, MD;  Location: [facility];   Service: Urology;  Laterality: Bilateral;   ROBOT ASSISTED LAPAROSCOPIC RADICAL PROSTATECTOMY N/A 02/28/2020   Procedure: XI ROBOTIC ASSISTED LAPAROSCOPIC RADICAL PROSTATECTOMY LEVEL 2;  Surgeon: Raynelle Bring, MD;  Location: [facility];  Service: Urology;  Laterality: N/A;       Home Medications    Prior to Admission medications   Medication Sig Start Date End Date Taking? Authorizing Provider  allopurinol (ZYLOPRIM) 300 MG tablet Take 300 mg by mouth daily. 11/28/19   [provider]  cephALEXin (KEFLEX) 500 MG capsule Take 1 capsule (500 mg total) by mouth 2 (two) times daily. 08/24/22   Flossie Dibble, NP  clopidogrel (PLAVIX) 75 MG tablet Take 1 tablet (75 mg total) by mouth daily. 05/21/22 05/21/23  Lorretta Harp, MD  nitroGLYCERIN (NITROSTAT) 0.4 MG SL tablet Place 1 tablet (0.4 mg total) under the tongue every 5 (five) minutes x 3 doses as needed for chest pain. 09/01/21   Bhagat, Crista Luria, PA  rosuvastatin (CRESTOR) 40 MG tablet Take 1 tablet (40 mg total) by mouth at bedtime. 06/28/22   [name], PA    Family History Family History  Problem Relation Age of Onset   Rheumatic fever Father 50       Died from complications of RF but might have been a heart attack   Colon cancer Neg Hx  Rectal cancer Neg Hx    Stomach cancer Neg Hx    Esophageal cancer Neg Hx     Social History Social History   Tobacco Use   Smoking status: Never   Smokeless tobacco: Never  Vaping Use   Vaping Use: Never used  Substance Use Topics   Alcohol use: Not Currently   Drug use: No     Allergies   Colchicine   Review of Systems Review of Systems   Physical Exam Triage Vital Signs ED Triage Vitals  Enc Vitals Group     BP 08/25/22 0841 (!) 170/92     Pulse Rate 08/25/22 0841 64     Resp 08/25/22 0841 18     Temp 08/25/22 0841 98 F (36.7 C)     Temp Source 08/25/22 0841 Oral     SpO2 08/25/22 0841 96 %     Weight --      Height --      Head  Circumference --      Peak Flow --      Pain Score 08/25/22 0838 3     Pain Loc --      Pain Edu? --      Excl. in [place]? --    No data found.  Updated Vital Signs BP (!) 148/82 (BP Location: Right Arm)   Pulse 64   Temp 98 F (36.7 C) (Oral)   Resp 18   SpO2 96%   Visual Acuity Right Eye Distance:   Left Eye Distance:   Bilateral Distance:    Right Eye Near:   Left Eye Near:    Bilateral Near:     Physical Exam Vitals reviewed.  Constitutional:      General: He is not in acute distress.    Appearance: He is not ill-appearing, toxic-appearing or diaphoretic.  Skin:    Comments: The 6 deeper center portion of the avulsion/abrasion is not bleeding and has the black eschar from the silver nitrate placed yesterday evening.  This morning there is some very slow oozing from the proximal pole of the wound and a little bit from the ulnar side of the wound  Neurological:     Mental Status: He is alert.      UC Treatments / Results  Labs (all labs ordered are listed, but only abnormal results are displayed) Labs Reviewed - No data to display  EKG   Radiology No results found.  Procedures Procedures (including critical care time)  Medications Ordered in UC Medications - No data to display  Initial Impression / Assessment and Plan / UC Course  I have reviewed the triage vital signs and the nursing notes.  Pertinent labs & imaging results that were available during my care of the patient were reviewed by me and considered in my medical decision making (see chart for details).       The patient applied pressure while I got the dressing supplies.  Silver nitrate cautery was used to obtain hemostasis of the proximal pole and the ulnar side of the wound with good results.  Bacitracin and nonstick Telfa dressing is applied with a pressure bandage. We discussed wound care.  I like him to take the bandage off about 6:00 this evening or in about 9 or 10 hours.  That way if  it is bleeding again he can return to get rechecked.   Final Clinical Impressions(s) / UC Diagnoses   Final diagnoses:  Bleeding from wound     Discharge  Instructions      Patient will return this evening if still oozing blood.      ED Prescriptions   None    PDMP not reviewed this encounter.   Barrett Henle, MD 08/25/22 5208    Barrett Henle, MD 08/25/22 [phone]

## 2022-08-25 NOTE — Discharge Instructions (Signed)
Patient will return this evening if still oozing blood.

## 2022-08-25 NOTE — ED Triage Notes (Signed)
Patient seen last night 08/24/2022 for a right index finger injury.  Patient takes plavix.  Patient was seen and treated and instructed to return if site continued bleeding.

## 2022-08-26 ENCOUNTER — Ambulatory Visit: Payer: No Typology Code available for payment source | Admitting: Cardiovascular Disease

## 2022-09-01 ENCOUNTER — Ambulatory Visit
Payer: No Typology Code available for payment source | Attending: Cardiovascular Disease | Admitting: Cardiovascular Disease

## 2022-09-01 ENCOUNTER — Encounter: Payer: Self-pay | Admitting: Cardiovascular Disease

## 2022-09-01 VITALS — BP 128/84 | HR 67 | Ht 71.0 in | Wt 204.6 lb

## 2022-09-01 DIAGNOSIS — G4733 Obstructive sleep apnea (adult) (pediatric): Secondary | ICD-10-CM

## 2022-09-01 DIAGNOSIS — E782 Mixed hyperlipidemia: Secondary | ICD-10-CM

## 2022-09-01 DIAGNOSIS — I25119 Atherosclerotic heart disease of native coronary artery with unspecified angina pectoris: Secondary | ICD-10-CM

## 2022-09-01 DIAGNOSIS — I10 Essential (primary) hypertension: Secondary | ICD-10-CM | POA: Diagnosis not present

## 2022-09-01 NOTE — Assessment & Plan Note (Signed)
History of obstructive sleep apnea on CPAP which he does not wear or tolerate followed by Dr. Claiborne Billings.  I am going to refer him to Dr. Radford Pax to discuss the inspire device.

## 2022-09-01 NOTE — Patient Instructions (Addendum)
Medication Instructions:  Your physician recommends that you continue on your current medications as directed. Please refer to the Current Medication list given to you today.  *If you need a refill on your cardiac medications before your next appointment, please call your pharmacy*   Follow-Up: At [facility], you and your health needs are our priority.  As part of our continuing mission to provide you with exceptional heart care, we have created designated Provider Care Teams.  These Care Teams include your primary Cardiologist (physician) and Advanced Practice Providers (APPs -  Physician Assistants and Nurse Practitioners) who all work together to provide you with the care you need, when you need it.  We recommend signing up for the patient portal called "MyChart".  Sign up information is provided on this After Visit Summary.  MyChart is used to connect with patients for Virtual Visits (Telemedicine).  Patients are able to view lab/test results, encounter notes, upcoming appointments, etc.  Non-urgent messages can be sent to your provider as well.   To learn more about what you can do with MyChart, go to [email or web address].    Your next appointment:   6 month(s)  Provider:   Laurann Montana, NP       Then, Quay Burow, MD will plan to see you again in 12 month(s).

## 2022-09-01 NOTE — Assessment & Plan Note (Signed)
History of hyperlipidemia on high-dose statin therapy with lipid profile performed 08/17/2022 revealing total cholesterol 136, LDL 67 and HDL 46.

## 2022-09-01 NOTE — Assessment & Plan Note (Signed)
History of essential hypertension blood pressure measured today at 128/84.  He is not on antihypertensive medications.

## 2022-09-01 NOTE — Progress Notes (Signed)
09/01/2022 [name]   05/26/1961  [id number]  Primary Physician Ginger Organ., MD Primary Cardiologist: Lorretta Harp MD Lupe Carney, [place]  HPI:  Kevin Wallace is a 61 y.o.  mildly overweight but fit appearing married Caucasian male father of 2 children who works as a Publishing copy (desk job).  I last saw him in the office 06/02/2022.  His cardiac risk factors notable for just hyperlipidemia.  There is no family history for heart disease.  He had a non-STEMI on 08/30/2021 and underwent cardiac catheterization the following day by Dr. Angelena Form revealing a 99% mid LAD lesion after a diagonal branch which was successfully stented using a 2.5 mm x 33 mm long Goodman drug-eluting stent with an excellent result.  He had scattered nonobstructive disease otherwise with preserved ejection fraction.  He was discharged home the following day on aspirin and Brilinta.  He has had 1 nosebleed but none since.  He complains of easy bruising.  He apparently also had a recent home sleep study which was positive.  Interestingly, he did have a coronary calcium score performed by his PCP 09/05/2020 which was low (6) with a small amount of calcium in the LAD territory.  He is fairly active and plays golf and walks.   Since I saw him 3 months ago he did have an episode of chest pain and syncope possibly related taking nitroglycerin.  He did wear an event monitor which showed short runs of NSVT and PSVT several which were associated with symptoms.  He also had an admission with a GI bleed at which time his Brilinta was transitioned to clopidogrel.  He has sleep apnea followed by Dr. Claiborne Billings but he is intolerant to CPAP.   Current Meds  Medication Sig   allopurinol (ZYLOPRIM) 300 MG tablet Take 300 mg by mouth daily.   cephALEXin (KEFLEX) 500 MG capsule Take 1 capsule (500 mg total) by mouth 2 (two) times daily.   clopidogrel (PLAVIX) 75 MG tablet Take 1 tablet (75 mg  total) by mouth daily.   nitroGLYCERIN (NITROSTAT) 0.4 MG SL tablet Place 1 tablet (0.4 mg total) under the tongue every 5 (five) minutes x 3 doses as needed for chest pain.   rosuvastatin (CRESTOR) 40 MG tablet Take 1 tablet (40 mg total) by mouth at bedtime.     Allergies  Allergen Reactions   Colchicine Diarrhea and Nausea And Vomiting    Social History   Socioeconomic History   Marital status: Married    Spouse name: [name]   Number of children: 2   Years of education: 18   Highest education level: Master's degree (e.g., MA, MS, MEng, MEd, MSW, MBA)  Occupational History   Occupation: desk work as a Freight forwarder  Tobacco Use   Smoking status: Never   Smokeless tobacco: Never  Scientific laboratory technician Use: Never used  Substance and Sexual Activity   Alcohol use: Not Currently   Drug use: No   Sexual activity: Yes    Birth control/protection: None  Other Topics Concern   Not on file  Social History Narrative   Lives with wife.     Social Determinants of Health   Financial Resource Strain: Not on file  Food Insecurity: No Food Insecurity (04/20/2022)   Hunger Vital Sign    Worried About Running Out of Food in the Last Year: Never true    Ran Out of Food in the Last Year: Never true  Transportation Needs: No Transportation Needs (04/20/2022)   PRAPARE - Hydrologist (Medical): No    Lack of Transportation (Non-Medical): No  Physical Activity: Not on file  Stress: Not on file  Social Connections: Not on file  Intimate Partner Violence: Not At Risk (04/20/2022)   Humiliation, Afraid, Rape, and Kick questionnaire    Fear of Current or Ex-Partner: No    Emotionally Abused: No    Physically Abused: No    Sexually Abused: No     Review of Systems: General: negative for chills, fever, night sweats or weight changes.  Cardiovascular: negative for chest pain, dyspnea on exertion, edema, orthopnea, palpitations, paroxysmal nocturnal dyspnea or shortness of  breath Dermatological: negative for rash Respiratory: negative for cough or wheezing Urologic: negative for hematuria Abdominal: negative for nausea, vomiting, diarrhea, bright red blood per rectum, melena, or hematemesis Neurologic: negative for visual changes, syncope, or dizziness All other systems reviewed and are otherwise negative except as noted above.    Blood pressure 128/84, pulse 67, height '5\' 11"'$  (1.803 m), weight 204 lb 9.6 oz (92.8 kg), SpO2 95 %.  General appearance: alert and no distress Neck: no adenopathy, no carotid bruit, no JVD, supple, symmetrical, trachea midline, and thyroid not enlarged, symmetric, no tenderness/mass/nodules Lungs: clear to auscultation bilaterally Heart: regular rate and rhythm, S1, S2 normal, no murmur, click, rub or gallop Extremities: extremities normal, atraumatic, no cyanosis or edema Pulses: 2+ and symmetric Skin: Skin color, texture, turgor normal. No rashes or lesions Neurologic: Grossly normal  EKG not performed today  ASSESSMENT AND PLAN:   Coronary artery disease History of CAD status post mid LAD stenting by Dr. Angelena Form 08/31/2021.  He had minimal nonobstructive CAD otherwise.  He was on DAPT with Brilinta but has had GI bleed and this was transitioned to Plavix monotherapy.  He had occasional atypical chest pain.  HTN (hypertension) History of essential hypertension blood pressure measured today at 128/84.  He is not on antihypertensive medications.  Hyperlipidemia History of hyperlipidemia on high-dose statin therapy with lipid profile performed 08/17/2022 revealing total cholesterol 136, LDL 67 and HDL 46.  Obstructive sleep apnea History of obstructive sleep apnea on CPAP which he does not wear or tolerate followed by Dr. Claiborne Billings.  I am going to refer him to Dr. Radford Pax to discuss the inspire device.     Lorretta Harp MD FACP,FACC,FAHA, [facility] 09/01/2022 4:46 PM

## 2022-09-01 NOTE — Assessment & Plan Note (Signed)
History of CAD status post mid LAD stenting by Dr. Angelena Form 08/31/2021.  He had minimal nonobstructive CAD otherwise.  He was on DAPT with Brilinta but has had GI bleed and this was transitioned to Plavix monotherapy.  He had occasional atypical chest pain.

## 2022-09-03 ENCOUNTER — Encounter: Payer: Self-pay | Admitting: Cardiovascular Disease

## 2022-09-19 ENCOUNTER — Encounter: Payer: Self-pay | Admitting: Cardiovascular Disease

## 2022-09-19 DIAGNOSIS — R079 Chest pain, unspecified: Secondary | ICD-10-CM

## 2022-09-20 NOTE — Addendum Note (Signed)
Addended by: Beatrix Fetters on: 09/20/2022 04:34 PM   Modules accepted: Orders

## 2022-09-21 LAB — TROPONIN T: Troponin T (Highly Sensitive): 16 ng/L (ref 0–22)

## 2022-09-27 ENCOUNTER — Observation Stay (HOSPITAL_COMMUNITY)
Admission: EM | Admit: 2022-09-27 | Discharge: 2022-10-01 | Disposition: A | Discharge status: Home / Self Care | Payer: No Typology Code available for payment source | Attending: Emergency Medicine | Admitting: Emergency Medicine

## 2022-09-27 ENCOUNTER — Encounter (HOSPITAL_COMMUNITY): Payer: Self-pay | Admitting: Emergency Medicine

## 2022-09-27 DIAGNOSIS — E782 Mixed hyperlipidemia: Secondary | ICD-10-CM

## 2022-09-27 DIAGNOSIS — K551 Chronic vascular disorders of intestine: Secondary | ICD-10-CM | POA: Insufficient documentation

## 2022-09-27 DIAGNOSIS — Z7902 Long term (current) use of antithrombotics/antiplatelets: Secondary | ICD-10-CM | POA: Diagnosis not present

## 2022-09-27 DIAGNOSIS — Z955 Presence of coronary angioplasty implant and graft: Secondary | ICD-10-CM

## 2022-09-27 DIAGNOSIS — K5731 Diverticulosis of large intestine without perforation or abscess with bleeding: Principal | ICD-10-CM

## 2022-09-27 DIAGNOSIS — K922 Gastrointestinal hemorrhage, unspecified: Secondary | ICD-10-CM | POA: Diagnosis not present

## 2022-09-27 DIAGNOSIS — Z79899 Other long term (current) drug therapy: Secondary | ICD-10-CM | POA: Insufficient documentation

## 2022-09-27 DIAGNOSIS — D62 Acute posthemorrhagic anemia: Secondary | ICD-10-CM | POA: Diagnosis not present

## 2022-09-27 DIAGNOSIS — K625 Hemorrhage of anus and rectum: Secondary | ICD-10-CM | POA: Diagnosis present

## 2022-09-27 DIAGNOSIS — K921 Melena: Secondary | ICD-10-CM

## 2022-09-27 DIAGNOSIS — M109 Gout, unspecified: Secondary | ICD-10-CM | POA: Insufficient documentation

## 2022-09-27 DIAGNOSIS — D72829 Elevated white blood cell count, unspecified: Secondary | ICD-10-CM

## 2022-09-27 DIAGNOSIS — Z8546 Personal history of malignant neoplasm of prostate: Secondary | ICD-10-CM | POA: Insufficient documentation

## 2022-09-27 DIAGNOSIS — I2510 Atherosclerotic heart disease of native coronary artery without angina pectoris: Secondary | ICD-10-CM | POA: Diagnosis not present

## 2022-09-27 DIAGNOSIS — E785 Hyperlipidemia, unspecified: Secondary | ICD-10-CM | POA: Diagnosis present

## 2022-09-27 DIAGNOSIS — I700 Atherosclerosis of aorta: Secondary | ICD-10-CM | POA: Diagnosis not present

## 2022-09-27 LAB — COMPREHENSIVE METABOLIC PANEL
ALT: 38 U/L (ref 0–44)
ALT: 36 U/L (ref 0–44)
AST: 45 U/L — ABNORMAL HIGH (ref 15–41)
AST: 41 U/L (ref 15–41)
Albumin: 3.6 g/dL (ref 3.5–5.0)
Albumin: 3.1 g/dL — ABNORMAL LOW (ref 3.5–5.0)
Alkaline Phosphatase: 86 U/L (ref 38–126)
Alkaline Phosphatase: 70 U/L (ref 38–126)
Anion gap: 9 (ref 5–15)
Anion gap: 6 (ref 5–15)
BUN: 14 mg/dL (ref 8–23)
BUN: 15 mg/dL (ref 8–23)
CO2: 23 mmol/L (ref 22–32)
CO2: 24 mmol/L (ref 22–32)
Calcium: 8.8 mg/dL — ABNORMAL LOW (ref 8.9–10.3)
Calcium: 8 mg/dL — ABNORMAL LOW (ref 8.9–10.3)
Chloride: 103 mmol/L (ref 98–111)
Chloride: 105 mmol/L (ref 98–111)
Creatinine, Ser: 1.14 mg/dL (ref 0.61–1.24)
Creatinine, Ser: 1.01 mg/dL (ref 0.61–1.24)
GFR, Estimated: >60 mL/min (ref >60)
GFR, Estimated: >60 mL/min (ref >60)
Glucose, Bld: 124 mg/dL — ABNORMAL HIGH (ref 70–99)
Glucose, Bld: 117 mg/dL — ABNORMAL HIGH (ref 70–99)
Potassium: 4.3 mmol/L (ref 3.5–5.1)
Potassium: 4.5 mmol/L (ref 3.5–5.1)
Sodium: 135 mmol/L (ref 135–145)
Sodium: 135 mmol/L (ref 135–145)
Total Bilirubin: 0.6 mg/dL (ref 0.3–1.2)
Total Bilirubin: 0.4 mg/dL (ref 0.3–1.2)
Total Protein: 6.5 g/dL (ref 6.5–8.1)
Total Protein: 5.6 g/dL — ABNORMAL LOW (ref 6.5–8.1)

## 2022-09-27 LAB — CBC WITH DIFFERENTIAL/PLATELET
Abs Immature Granulocytes: 0.04 10*3/uL (ref 0.00–0.07)
Basophils Absolute: 0 10*3/uL (ref 0.0–0.1)
Basophils Relative: 0 %
Eosinophils Absolute: 0.1 10*3/uL (ref 0.0–0.5)
Eosinophils Relative: 1 %
HCT: 41.8 % (ref 39.0–52.0)
Hemoglobin: 14.3 g/dL (ref 13.0–17.0)
Immature Granulocytes: 0 %
Lymphocytes Relative: 12 %
Lymphs Abs: 1.2 10*3/uL (ref 0.7–4.0)
MCH: 31 pg (ref 26.0–34.0)
MCHC: 34.2 g/dL (ref 30.0–36.0)
MCV: 90.7 fL (ref 80.0–100.0)
Monocytes Absolute: 1 10*3/uL (ref 0.1–1.0)
Monocytes Relative: 9 %
Neutro Abs: 8.4 10*3/uL — ABNORMAL HIGH (ref 1.7–7.7)
Neutrophils Relative %: 78 %
Platelets: 263 10*3/uL (ref 150–400)
RBC: 4.61 MIL/uL (ref 4.22–5.81)
RDW: 13.1 % (ref 11.5–15.5)
WBC: 10.7 10*3/uL — ABNORMAL HIGH (ref 4.0–10.5)
nRBC: 0 % (ref 0.0–0.2)

## 2022-09-27 LAB — CBC
HCT: 49 % (ref 39.0–52.0)
Hemoglobin: 16 g/dL (ref 13.0–17.0)
MCH: 30.1 pg (ref 26.0–34.0)
MCHC: 32.7 g/dL (ref 30.0–36.0)
MCV: 92.1 fL (ref 80.0–100.0)
Platelets: 347 10*3/uL (ref 150–400)
RBC: 5.32 MIL/uL (ref 4.22–5.81)
RDW: 13.1 % (ref 11.5–15.5)
WBC: 11.7 10*3/uL — ABNORMAL HIGH (ref 4.0–10.5)
nRBC: 0 % (ref 0.0–0.2)

## 2022-09-27 LAB — MAGNESIUM: Magnesium: 2 mg/dL (ref 1.7–2.4)

## 2022-09-27 LAB — PROTIME-INR
INR: 1 (ref 0.8–1.2)
Prothrombin Time: 13.3 seconds (ref 11.4–15.2)

## 2022-09-27 LAB — TYPE AND SCREEN
ABO/RH(D): A POS
Antibody Screen: NEGATIVE

## 2022-09-27 LAB — HEMOGLOBIN AND HEMATOCRIT, BLOOD
HCT: 40.9 % (ref 39.0–52.0)
HCT: 41.7 % (ref 39.0–52.0)
HCT: 40.1 % (ref 39.0–52.0)
Hemoglobin: 13.9 g/dL (ref 13.0–17.0)
Hemoglobin: 13.8 g/dL (ref 13.0–17.0)
Hemoglobin: 13.9 g/dL (ref 13.0–17.0)

## 2022-09-27 LAB — APTT: aPTT: 31 seconds (ref 24–36)

## 2022-09-27 LAB — CBG MONITORING, ED: Glucose-Capillary: 122 mg/dL — ABNORMAL HIGH (ref 70–99)

## 2022-09-27 MED ORDER — ROSUVASTATIN CALCIUM 20 MG PO TABS
40.0000 mg | ORAL_TABLET | Freq: Every day | ORAL | Status: DC
  Administered 2022-09-27 – 2022-09-30 (×4): 40 mg via ORAL
  Filled 2022-09-27 (×5): qty 2

## 2022-09-27 MED ORDER — SODIUM CHLORIDE 0.9 % IV BOLUS
1000.0000 mL | Freq: Once | INTRAVENOUS | Status: AC
  Administered 2022-09-27: 1000 mL via INTRAVENOUS

## 2022-09-27 MED ORDER — SODIUM CHLORIDE 0.9 % IV SOLN: INTRAVENOUS | Status: DC

## 2022-09-27 MED ORDER — ACETAMINOPHEN 325 MG PO TABS
650.0000 mg | ORAL_TABLET | Freq: Four times a day (QID) | ORAL | Status: DC | PRN
  Administered 2022-09-28: 650 mg via ORAL
  Filled 2022-09-27: qty 2

## 2022-09-27 MED ORDER — ALLOPURINOL 300 MG PO TABS
300.0000 mg | ORAL_TABLET | Freq: Every day | ORAL | Status: DC
  Administered 2022-09-27 – 2022-10-01 (×4): 300 mg via ORAL
  Filled 2022-09-27 (×2): qty 1
  Filled 2022-09-27: qty 3
  Filled 2022-09-27: qty 1

## 2022-09-27 MED ORDER — ACETAMINOPHEN 650 MG RE SUPP: 650.0000 mg | Freq: Four times a day (QID) | RECTAL | Status: DC | PRN

## 2022-09-27 NOTE — ED Triage Notes (Signed)
Pt reports he started having rectal bleeding every hour since about 7pm.  He started to feel light headed and had a syncopal episode.  Pt became very pale but was only unresponsive for about 20 seconds.  He did have a bowel movement.  Pt placed on bed.

## 2022-09-27 NOTE — Care Plan (Signed)
Pt arrived to unit via stretcher accompanied by RN.  HE is ambulatory to bed.  Lungs CTA, RRR.  Bowel sounds present.  No bleeding fom rectum since around 0200.  No dizziness when standing at present.  NADN.  [place], RN

## 2022-09-27 NOTE — Progress Notes (Signed)
PROGRESS NOTE  Kevin Wallace [id number] DOB: [birth year]   PCP: Kevin Wallace., MD  Patient is from: Home.  Lives with his wife.  DOA: 09/27/2022 LOS: 0  Chief complaints Chief Complaint  Patient presents with   Rectal Bleeding   Loss of Consciousness     Brief Narrative / Interim history: 62 year old M with history of CAD s/p DES to LAD in 08/2021, previous GI bleed thought to be diverticular and hyperlipidemia presenting with 7 episodes of bright red blood per rectum for about 6 hours prior to arrival to ED, and admitted for rectal bleed.  Patient is on Plavix.  No abdominal pain.  Hgb 16.  Hemodynamically stable.  GI consulted.   Subjective: Seen and examined earlier this morning.  No major events overnight of this morning.  Last bowel movement about 2 AM last night, and nonbloody.  Denies abdominal pain, chest pain, dyspnea, dizziness, nausea or vomiting.  Denies NSAID use.  Objective: Vitals:   09/27/22 0600 09/27/22 0758 09/27/22 0807 09/27/22 1237  BP: 124/72  135/72 127/78  Pulse: 60  64 66  Resp: 15  13 16  $ Temp: 98.5 F (36.9 C)  (!) 97.2 F (36.2 C)   TempSrc: Oral     SpO2: 97% 97% 97% 97%  Weight:      Height:        Examination:  GENERAL: No apparent distress.  Nontoxic. HEENT: MMM.  Vision and hearing grossly intact.  NECK: Supple.  No apparent JVD.  RESP:  No IWOB.  Fair aeration bilaterally. CVS:  RRR. Heart sounds normal.  ABD/GI/GU: BS+. Abd soft, NTND.  MSK/EXT:  Moves extremities. No apparent deformity. No edema.  SKIN: no apparent skin lesion or wound NEURO: Awake, alert and oriented appropriately.  No apparent focal neuro deficit. PSYCH: Calm. Normal affect.   Procedures:  None  Microbiology summarized: None  Assessment and plan: Principal Problem:   Acute lower GI bleeding Active Problems:   HLD (hyperlipidemia)   Hematochezia   Leukocytosis  Acute lower GI bleeding: Painless bleeding likely diverticular.  Hospitalized  for the same about 5 months ago. Recent Labs    10/13/21 1128 04/19/22 0720 04/19/22 1145 04/20/22 0301 04/20/22 1606 04/21/22 1006 09/27/22 0142 09/27/22 0400 09/27/22 0949  HGB 17.0 15.6 15.8 14.4 13.5 13.2 16.0 14.3 13.9  -GI following-appreciate recommendation -Monitor H&H.  Transfuse for Hgb less than 8 given CAD. -On clear liquid diet per GI. -Discontinue IV fluid. -CT angio if significant profuse bleeding -Continue holding Plavix.  History of CAD s/p LAD in 08/2021.  On Plavix at home. -Continue holding Plavix -Continue home Crestor  Hyperlipidemia -Continue home Crestor  History of gout -Continue home allopurinol  Leukocytosis: Likely demargination.  Resolved.  Body mass index is 27.2 kg/m.          DVT prophylaxis:  SCDs Start: 09/27/22 [id number]  Code Status: Full code Family Communication: Updated patient's wife and daughter at bedside. Level of care: Telemetry Medical Status is: Observation The patient remains OBS appropriate and will d/c before 2 midnights.   Final disposition: Home once medically cleared Consultants:  GI  30 minutes with more than 50% spent in reviewing records, counseling patient/family and coordinating care.   Sch Meds:  Scheduled Meds:  allopurinol  300 mg Oral Daily   rosuvastatin  40 mg Oral QHS   Continuous Infusions:   PRN Meds:.acetaminophen **OR** acetaminophen  Antimicrobials: Anti-infectives (From admission, onward)    None  I have personally reviewed the following labs and images: CBC: Recent Labs  Lab 09/27/22 0142 09/27/22 0400 09/27/22 0949  WBC 11.7* 10.7*  --   NEUTROABS  --  8.4*  --   HGB 16.0 14.3 13.9  HCT 49.0 41.8 40.9  MCV 92.1 90.7  --   PLT 347 263  --    BMP &GFR Recent Labs  Lab 09/27/22 0142 09/27/22 0400  NA 135 135  K 4.3 4.5  CL 103 105  CO2 23 24  GLUCOSE 124* 117*  BUN 14 15  CREATININE 1.14 1.01  CALCIUM 8.8* 8.0*  MG  --  2.0   Estimated Creatinine  Clearance: 81.8 mL/min (by C-G formula based on SCr of 1.01 mg/dL). Liver & Pancreas: Recent Labs  Lab 09/27/22 0142 09/27/22 0400  AST 45* 41  ALT 38 36  ALKPHOS 86 70  BILITOT 0.6 0.4  PROT 6.5 5.6*  ALBUMIN 3.6 3.1*   No results for input(s): "LIPASE", "AMYLASE" in the last 168 hours. No results for input(s): "AMMONIA" in the last 168 hours. Diabetic: No results for input(s): "HGBA1C" in the last 72 hours. Recent Labs  Lab 09/27/22 0140  GLUCAP 122*   Cardiac Enzymes: No results for input(s): "CKTOTAL", "CKMB", "CKMBINDEX", "TROPONINI" in the last 168 hours. No results for input(s): "PROBNP" in the last 8760 hours. Coagulation Profile: Recent Labs  Lab 09/27/22 0142  INR 1.0   Thyroid Function Tests: No results for input(s): "TSH", "T4TOTAL", "FREET4", "T3FREE", "THYROIDAB" in the last 72 hours. Lipid Profile: No results for input(s): "CHOL", "HDL", "LDLCALC", "TRIG", "CHOLHDL", "LDLDIRECT" in the last 72 hours. Anemia Panel: No results for input(s): "VITAMINB12", "FOLATE", "FERRITIN", "TIBC", "IRON", "RETICCTPCT" in the last 72 hours. Urine analysis: No results found for: "COLORURINE", "APPEARANCEUR", "LABSPEC", "PHURINE", "GLUCOSEU", "HGBUR", "BILIRUBINUR", "KETONESUR", "PROTEINUR", "UROBILINOGEN", "NITRITE", "LEUKOCYTESUR" Sepsis Labs: Invalid input(s): "PROCALCITONIN", "LACTICIDVEN"  Microbiology: No results found for this or any previous visit (from the past 240 hour(s)).  Radiology Studies: No results found.    Kevin Wallace Kevin Wallace  If 7PM-7AM, please contact night-coverage [email or web address] 09/27/2022, 12:49 PM

## 2022-09-27 NOTE — ED Notes (Signed)
Patient walked to the bathroom patient is now back in bed on the monitor with family at bedside and call bell in reach

## 2022-09-27 NOTE — ED Notes (Signed)
Pt ambulated to restroom independently without any complaints.

## 2022-09-27 NOTE — Consult Note (Addendum)
Consultation  Referring Provider:     Dr. Cyndia Skeeters Primary Care Physician:  Ginger Organ., MD Primary Gastroenterologist: Dr. Hilarie Fredrickson       Reason for Consultation: GI bleed            HPI:   Kevin Wallace is a 62 y.o. male with a past medical history significant for lower GI bleed in September 2023, CAD status post DES to the LAD in January 2023, hyperlipidemia and others as listed below, who was admitted to the hospital in 09/27/2022 for suspected acute lower GI bleed after presenting from home to the ED complaining of bright red blood per rectum.    At time of admission patient described 1 day of recurrent episodes of hematochezia with the most recent episode just before presenting to the ED yesterday evening.  Patient is on daily Plavix after having a drug-eluting stent placed to the LAD in January 2023.    Previous hospitalization in September 2023 for acute lower GI bleed with similar presentation, felt to be diverticular in nature.    Today, patient is seen with his wife and his daughter by his bedside, they do help with his history.  Apparently he started with bleeding yesterday around 7:00 and had 7-8 episodes last around 1 to 2:00 in the morning while in the ER.  Explains that while in the ER he did have an episode where he briefly lost consciousness for a second or so per his daughter.  This was while he was sitting down in the chair being triaged.  He tells me has not had any bleeding since then.  Does describe that he started with congestion and a headache on Saturday of this past weekend 09/25/2022, the symptoms are better and he was not sure if it was related at all.  He has been taking his Plavix with his last dose yesterday morning 09/26/2022.  Denies any history of constipation, not on a fiber supplement.    Patient's wife is due to travel to the [place] tomorrow for a wedding.    Denies fever, chills, weight loss, nausea, vomiting or abdominal pain.  ED course: BP and heart  rate improved with 1 L normal saline, CMP with a sodium 135, BUN 14, creatinine 1.14 compared to most recent prior value 1.01 on 04/21/2022, liver enzymes within normal limits, hemoglobin 16, platelets 347, INR 1.0  GI history: 04/19/2022-9/ 6/23 admission for diverticular bleed: During that hospitalization breathing stopped, CTA confirmed active bleeding at proximal sigmoid and extensive diverticulosis, IR did not pursue or recommend intervention favoring reversal of his anticoagulation 12/2019 colonoscopy with one 4 mm polyp at the ileocecal valve, severe diverticulosis from the ascending to sigmoid colon no evidence of diverticular bleeding as well as internal hemorrhoids  Past Medical History:  Diagnosis Date   Allergy    CAD (coronary artery disease)    s/p DES to mLAD 08/31/21   Cancer ([place]) 11/2019   s/p prostatectomy   Complication of anesthesia    took a long time to wear off    Gout    High cholesterol    Crestor    Past Surgical History:  Procedure Laterality Date   ANKLE ARTHROSCOPY     "had it cleaned out"   CARDIAC CATHETERIZATION     COLONOSCOPY  10/14/2014   KNEE ARTHROSCOPY  07/2019   KNEE ARTHROSCOPY  1980s   LEFT HEART CATH AND CORONARY ANGIOGRAPHY N/A 08/31/2021   Procedure: LEFT HEART CATH AND CORONARY  ANGIOGRAPHY;  Surgeon: Burnell Blanks, MD;  Location: [facility];  Service: Cardiovascular;  Laterality: N/A;   LYMPHADENECTOMY Bilateral 02/28/2020   Procedure: LYMPHADENECTOMY, PELVIC;  Surgeon: Raynelle Bring, MD;  Location: [facility];  Service: Urology;  Laterality: Bilateral;   ROBOT ASSISTED LAPAROSCOPIC RADICAL PROSTATECTOMY N/A 02/28/2020   Procedure: XI ROBOTIC ASSISTED LAPAROSCOPIC RADICAL PROSTATECTOMY LEVEL 2;  Surgeon: Raynelle Bring, MD;  Location: [facility];  Service: Urology;  Laterality: N/A;    Family History  Problem Relation Age of Onset   Rheumatic fever Father 67       Died from complications of RF but might have been a heart attack    Colon cancer Neg Hx    Rectal cancer Neg Hx    Stomach cancer Neg Hx    Esophageal cancer Neg Hx     Social History   Tobacco Use   Smoking status: Never   Smokeless tobacco: Never  Vaping Use   Vaping Use: Never used  Substance Use Topics   Alcohol use: Not Currently   Drug use: No    Prior to Admission medications   Medication Sig Start Date End Date Taking? Authorizing Provider  allopurinol (ZYLOPRIM) 300 MG tablet Take 300 mg by mouth daily. 11/28/19   [provider]  cephALEXin (KEFLEX) 500 MG capsule Take 1 capsule (500 mg total) by mouth 2 (two) times daily. 08/24/22   Flossie Dibble, NP  clopidogrel (PLAVIX) 75 MG tablet Take 1 tablet (75 mg total) by mouth daily. 05/21/22 05/21/23  Lorretta Harp, MD  nitroGLYCERIN (NITROSTAT) 0.4 MG SL tablet Place 1 tablet (0.4 mg total) under the tongue every 5 (five) minutes x 3 doses as needed for chest pain. 09/01/21   Bhagat, Crista Luria, PA  rosuvastatin (CRESTOR) 40 MG tablet Take 1 tablet (40 mg total) by mouth at bedtime. 06/28/22   [name], PA    Current Facility-Administered Medications  Medication Dose Route Frequency Provider Last Rate Last Admin   0.9 %  sodium chloride infusion   Intravenous Continuous Howerter, Justin B, DO 100 mL/hr at 09/27/22 0403 New Bag at 09/27/22 0403   acetaminophen (TYLENOL) tablet 650 mg  650 mg Oral Q6H PRN Howerter, Justin B, DO       Or   acetaminophen (TYLENOL) suppository 650 mg  650 mg Rectal Q6H PRN Howerter, Justin B, DO       Current Outpatient Medications  Medication Sig Dispense Refill   allopurinol (ZYLOPRIM) 300 MG tablet Take 300 mg by mouth daily.     cephALEXin (KEFLEX) 500 MG capsule Take 1 capsule (500 mg total) by mouth 2 (two) times daily. 10 capsule 0   clopidogrel (PLAVIX) 75 MG tablet Take 1 tablet (75 mg total) by mouth daily. 90 tablet 3   nitroGLYCERIN (NITROSTAT) 0.4 MG SL tablet Place 1 tablet (0.4 mg total) under the tongue every 5  (five) minutes x 3 doses as needed for chest pain. 25 tablet 12   rosuvastatin (CRESTOR) 40 MG tablet Take 1 tablet (40 mg total) by mouth at bedtime. 60 tablet 4    Allergies as of 09/27/2022 - Review Complete 09/27/2022  Allergen Reaction Noted   Colchicine Diarrhea and Nausea And Vomiting 11/05/2012     Review of Systems:    Constitutional: No weight loss, fever or chills Skin: No rash Cardiovascular: No chest pain  Respiratory: No SOB Gastrointestinal: See HPI and otherwise negative Genitourinary: No dysuria  Neurological: +recent headache and syncope Musculoskeletal: No  new muscle or joint pain Hematologic: No bruising Psychiatric: No history of depression or anxiety    Physical Exam:  Vital signs in last 24 hours: Temp:  [97.2 F (36.2 C)-98.7 F (37.1 C)] 97.2 F (36.2 C) (02/12 0807) Pulse Rate:  [60-97] 64 (02/12 0807) Resp:  [13-18] 13 (02/12 0807) BP: (99-135)/(70-77) 135/72 (02/12 0807) SpO2:  [96 %-99 %] 97 % (02/12 0807) Weight:  [88.5 kg] 88.5 kg (02/12 0141)   General:   Pleasant Caucasian male appears to be in NAD, Well developed, Well nourished, alert and cooperative Head:  Normocephalic and atraumatic. Eyes:   PEERL, EOMI. No icterus. Conjunctiva pink. Ears:  Normal auditory acuity. Neck:  Supple Throat: Oral cavity and pharynx without inflammation, swelling or lesion. Teeth in good condition. Lungs: Respirations even and unlabored. Lungs clear to auscultation bilaterally.   No wheezes, crackles, or rhonchi.  Heart: Normal S1, S2. No MRG. Regular rate and rhythm. No peripheral edema, cyanosis or pallor.  Abdomen:  Soft, nondistended, Mild RLQ ttp. No rebound or guarding. Normal bowel sounds. No appreciable masses or hepatomegaly. Rectal:  Not performed.  Msk:  Symmetrical without gross deformities. Peripheral pulses intact.  Extremities:  Without edema, no deformity or joint abnormality. Normal ROM, normal sensation. Neurologic:  Alert and  oriented  x4;  grossly normal neurologically.  Skin:   Dry and intact without significant lesions or rashes. Psychiatric: Demonstrates good judgement and reason without abnormal affect or behaviors.   LAB RESULTS: Recent Labs    09/27/22 0142 09/27/22 0400  WBC 11.7* 10.7*  HGB 16.0 14.3  HCT 49.0 41.8  PLT 347 263   BMET Recent Labs    09/27/22 0142 09/27/22 0400  NA 135 135  K 4.3 4.5  CL 103 105  CO2 23 24  GLUCOSE 124* 117*  BUN 14 15  CREATININE 1.14 1.01  CALCIUM 8.8* 8.0*   LFT Recent Labs    09/27/22 0400  PROT 5.6*  ALBUMIN 3.1*  AST 41  ALT 36  ALKPHOS 70  BILITOT 0.4   PT/INR Recent Labs    09/27/22 0142  LABPROT 13.3  INR 1.0     Impression / Plan:   Impression: 1.  Lower GI bleed: Hemoglobin 16.0--> 14.3 overnight (baseline seems between 13-15), CMP with a normal BUN and creatinine, describes starting with 7-8 episodes of bright red blood around 7 PM yesterday with the last around 1-2 o'clock this morning, previous to this no constipation, on Plavix as below, recent admission for the same with diverticular bleeding seen on CTA but not treated; likely diverticular bleed 2.  Chronic anticoagulation: Status post DES to the LAD 08/2021 on Plavix-currently on hold  Plan: 1.  Discussed in general that high-fiber diet of 25 to 35 g/day would be beneficial for the patient in the future.  Also water, half his body weight in ounces of water a day and exercise. 2.  Continue to monitor hemoglobin and transfusion as needed less than 7 3.  Discussed with patient that if he has no further bleeding over the next 24 hours then likely he can be discharged tomorrow. 4.  Continue to hold Plavix for now.  Did explain to the patient that this might be worth a discussion with his cardiologist to see whether or not he could back off on therapy. 5.  If patient has recurrent GI bleeding while here would recommend a CTA for further evaluation. 6.  Will start the patient on a clear  liquid diet for  now  Thank you for your kind consultation, we will continue to follow.  Lavone Nian [facility]  09/27/2022, 10:18 AM   I have taken a history, reviewed the chart and examined the patient. I performed a substantive portion of this encounter, including complete performance of at least one of the key components, in conjunction with the APP. I agree with the APP's note, impression and recommendations  Patient's clinical history very consistent with recurrent diverticular bleed, previously had localized to sigmoid.  CT-A not performed this admission.  His bleeding appears to have slowed significantly, as his last bowel movement was almost 12 hours ago. Recommend observation overnight.  If no further bleeding, can be discharged home tomorrow.  No plans for endoscopic evaluation, as patient's last colonoscopy was in 2021 and demonstrated severe diverticulosis.  If recurrent bleeding is a problem, patient can consider further de-escalation of anti-platelets with cardiologist.  Recommended high fiber diet or daily fiber supplement as adjunctive measure to potentially reduce further diverticular complications.   Priya Matsen E. Candis Schatz, MD [facility]astroenter[facility]

## 2022-09-27 NOTE — ED Notes (Signed)
Pt with moderate sized BRB stool in brief. Pericare provided. Linens changed. PA aware

## 2022-09-27 NOTE — ED Notes (Signed)
ED TO INPATIENT HANDOFF REPORT  ED Nurse Name and Phone #: [phone]  S Name/Age/Gender Kevin Genre 62 y.o. male Room/Bed: 038C/038C  Code Status   Code Status: Full Code  Home/SNF/Other Home Patient oriented to: self, place, time, and situation Is this baseline? Yes   Triage Complete: Triage complete  Chief Complaint Acute lower GI bleeding [K92.2]  Triage Note Pt reports he started having rectal bleeding every hour since about 7pm.  He started to feel light headed and had a syncopal episode.  Pt became very pale but was only unresponsive for about 20 seconds.  He did have a bowel movement.  Pt placed on bed.     Allergies Allergies  Allergen Reactions   Colchicine Diarrhea and Nausea And Vomiting    Level of Care/Admitting Diagnosis ED Disposition     ED Disposition  Admit   Condition  --   Comment  Hospital Area: [facility] [[id number]]  Level of Care: Telemetry Medical [104]  May place patient in observation at [facility] or [facility] if equivalent level of care is available:: No  Covid Evaluation: Asymptomatic - no recent exposure (last 10 days) testing not required  Diagnosis: Acute lower GI bleeding [id number]  Admitting Physician: Rhetta Mura [id number]  Attending Physician: Rhetta Mura [id number]          B Medical/Surgery History Past Medical History:  Diagnosis Date   Allergy    CAD (coronary artery disease)    s/p DES to mLAD 08/31/21   Cancer ([place]) 11/2019   s/p prostatectomy   Complication of anesthesia    took a long time to wear off    Gout    High cholesterol    Crestor   Past Surgical History:  Procedure Laterality Date   ANKLE ARTHROSCOPY     "had it cleaned out"   CARDIAC CATHETERIZATION     COLONOSCOPY  10/14/2014   KNEE ARTHROSCOPY  07/2019   KNEE ARTHROSCOPY  1980s   LEFT HEART CATH AND CORONARY ANGIOGRAPHY N/A 08/31/2021   Procedure: LEFT HEART CATH AND CORONARY ANGIOGRAPHY;  Surgeon:  Burnell Blanks, MD;  Location: [facility];  Service: Cardiovascular;  Laterality: N/A;   LYMPHADENECTOMY Bilateral 02/28/2020   Procedure: LYMPHADENECTOMY, PELVIC;  Surgeon: Raynelle Bring, MD;  Location: [facility];  Service: Urology;  Laterality: Bilateral;   ROBOT ASSISTED LAPAROSCOPIC RADICAL PROSTATECTOMY N/A 02/28/2020   Procedure: XI ROBOTIC ASSISTED LAPAROSCOPIC RADICAL PROSTATECTOMY LEVEL 2;  Surgeon: Raynelle Bring, MD;  Location[facility];  Service: Urology;  Laterality: N/A;     A IV Location/Drains/Wounds Patient Lines/Drains/Airways Status     Active Line/Drains/Airways     Name Placement date Placement time Site Days   Peripheral IV 09/27/22 20 G Right Antecubital 09/27/22  0146  Antecubital  less than 1            Intake/Output Last 24 hours  Intake/Output Summary (Last 24 hours) at 09/27/2022 1348 Last data filed at 09/27/2022 1014 Gross per 24 hour  Intake --  Output 300 ml  Net -300 ml    Labs/Imaging Results for orders placed or performed during the hospital encounter of 09/27/22 (from the past 48 hour(s))  CBG monitoring, ED     Status: Abnormal   Collection Time: 09/27/22  1:40 AM  Result Value Ref Range   Glucose-Capillary 122 (H) 70 - 99 mg/dL    Comment: Glucose reference range applies only to samples taken after fasting for at least  8 hours.  Comprehensive metabolic panel     Status: Abnormal   Collection Time: 09/27/22  1:42 AM  Result Value Ref Range   Sodium 135 135 - 145 mmol/L   Potassium 4.3 3.5 - 5.1 mmol/L   Chloride 103 98 - 111 mmol/L   CO2 23 22 - 32 mmol/L   Glucose, Bld 124 (H) 70 - 99 mg/dL    Comment: Glucose reference range applies only to samples taken after fasting for at least 8 hours.   BUN 14 8 - 23 mg/dL   Creatinine, Ser 1.14 0.61 - 1.24 mg/dL   Calcium 8.8 (L) 8.9 - 10.3 mg/dL   Total Protein 6.5 6.5 - 8.1 g/dL   Albumin 3.6 3.5 - 5.0 g/dL   AST 45 (H) 15 - 41 U/L   ALT 38 0 - 44 U/L   Alkaline  Phosphatase 86 38 - 126 U/L   Total Bilirubin 0.6 0.3 - 1.2 mg/dL   GFR, Estimated >60 >60 mL/min    Comment: (NOTE) Calculated using the CKD-EPI Creatinine Equation (2021)    Anion gap 9 5 - 15    Comment: Performed at [facility] [address] [place], [place] [address]  CBC     Status: Abnormal   Collection Time: 09/27/22  1:42 AM  Result Value Ref Range   WBC 11.7 (H) 4.0 - 10.5 K/uL   RBC 5.32 4.22 - 5.81 MIL/uL   Hemoglobin 16.0 13.0 - 17.0 g/dL   HCT 49.0 39.0 - 52.0 %   MCV 92.1 80.0 - 100.0 fL   MCH 30.1 26.0 - 34.0 pg   MCHC 32.7 30.0 - 36.0 g/dL   RDW 13.1 11.5 - 15.5 %   Platelets 347 150 - 400 K/uL   nRBC 0.0 0.0 - 0.2 %    Comment: Performed at [facility], [place] [address], [place], [place] [address]  Type and screen [place]     Status: None   Collection Time: 09/27/22  1:42 AM  Result Value Ref Range   ABO/RH(D) A POS    Antibody Screen NEG    Sample Expiration      09/30/2022,2359 Performed at [facility], [place] [address], [place], [place] [address]   Protime-INR - (order if Patient is taking Coumadin / Warfarin)     Status: None   Collection Time: 09/27/22  1:42 AM  Result Value Ref Range   Prothrombin Time 13.3 11.4 - 15.2 seconds   INR 1.0 0.8 - 1.2    Comment: (NOTE) INR goal varies based on device and disease states. Performed at [facility], [place] [address], [place], [place] [address]   CBC with Differential/Platelet     Status: Abnormal   Collection Time: 09/27/22  4:00 AM  Result Value Ref Range   WBC 10.7 (H) 4.0 - 10.5 K/uL   RBC 4.61 4.22 - 5.81 MIL/uL   Hemoglobin 14.3 13.0 - 17.0 g/dL   HCT 41.8 39.0 - 52.0 %   MCV 90.7 80.0 - 100.0 fL   MCH 31.0 26.0 - 34.0 pg   MCHC 34.2 30.0 - 36.0 g/dL   RDW 13.1 11.5 - 15.5 %   Platelets 263 150 - 400 K/uL   nRBC 0.0 0.0 - 0.2 %   Neutrophils Relative % 78 %   Neutro Abs 8.4 (H) 1.7 - 7.7 K/uL   Lymphocytes Relative 12 %   Lymphs Abs 1.2 0.7 -  4.0 K/uL   Monocytes Relative 9 %   Monocytes Absolute 1.0 0.1 - 1.0 K/uL   Eosinophils Relative 1 %   Eosinophils Absolute 0.1 0.0 - 0.5 K/uL   Basophils Relative 0 %   Basophils Absolute 0.0 0.0 - 0.1 K/uL   Immature Granulocytes 0 %   Abs Immature Granulocytes 0.04 0.00 - 0.07 K/uL    Comment: Performed at [facility], [place] [address], [place], [place] [address]  Comprehensive metabolic panel     Status: Abnormal   Collection Time: 09/27/22  4:00 AM  Result Value Ref Range   Sodium 135 135 - 145 mmol/L   Potassium 4.5 3.5 - 5.1 mmol/L   Chloride 105 98 - 111 mmol/L   CO2 24 22 - 32 mmol/L   Glucose, Bld 117 (H) 70 - 99 mg/dL    Comment: Glucose reference range applies only to samples taken after fasting for at least 8 hours.   BUN 15 8 - 23 mg/dL   Creatinine, Ser 1.01 0.61 - 1.24 mg/dL   Calcium 8.0 (L) 8.9 - 10.3 mg/dL   Total Protein 5.6 (L) 6.5 - 8.1 g/dL   Albumin 3.1 (L) 3.5 - 5.0 g/dL   AST 41 15 - 41 U/L   ALT 36 0 - 44 U/L   Alkaline Phosphatase 70 38 - 126 U/L   Total Bilirubin 0.4 0.3 - 1.2 mg/dL   GFR, Estimated >60 >60 mL/min    Comment: (NOTE) Calculated using the CKD-EPI Creatinine Equation (2021)    Anion gap 6 5 - 15    Comment: Performed at [facility], [place] [address], [place], [place] [address]  Magnesium     Status: None   Collection Time: 09/27/22  4:00 AM  Result Value Ref Range   Magnesium 2.0 1.7 - 2.4 mg/dL    Comment: Performed at [facility] [address], [place], [place] [address]  Hemoglobin and hematocrit, blood     Status: None   Collection Time: 09/27/22  9:49 AM  Result Value Ref Range   Hemoglobin 13.9 13.0 - 17.0 g/dL   HCT 40.9 39.0 - 52.0 %    Comment: Performed at [facility] [address] [place], [place] [address]   No results found.  Pending Labs Unresulted Labs (From admission, onward)     Start     Ordered   09/28/22 0500  CBC  Tomorrow morning,   R        09/27/22 1250   09/27/22 1251   Hemoglobin and hematocrit, blood  Now then every 8 hours,   R (with TIMED occurrences)      09/27/22 1250   09/27/22 0415  APTT  Once,   AD        09/27/22 0415            Vitals/Pain Today's Vitals   09/27/22 0758 09/27/22 0759 09/27/22 0807 09/27/22 1237  BP:   135/72 127/78  Pulse:   64 66  Resp:   13 16  Temp:   (!) 97.2 F (36.2 C)   TempSrc:      SpO2: 97%  97% 97%  Weight:      Height:      PainSc:  0-No pain      Isolation Precautions No active isolations  Medications Medications  acetaminophen (TYLENOL) tablet 650 mg (has no administration in time range)    Or  acetaminophen (TYLENOL) suppository 650 mg (  has no administration in time range)  allopurinol (ZYLOPRIM) tablet 300 mg (300 mg Oral Given 09/27/22 1336)  rosuvastatin (CRESTOR) tablet 40 mg (has no administration in time range)  sodium chloride 0.9 % bolus 1,000 mL (0 mLs Intravenous Stopped 09/27/22 0331)    Mobility walks     Focused Assessments Rectal bleed   R Recommendations: See Admitting Provider Note  Report given to:   Additional Notes: Pt is A & 0 x 4. They are monitoring in H and H. Not planning on scoping him. He said the last time he had a BM with blood was yesterday and 0100. Have not had a BM for me. Questionable D/C tomorrow on a clear liquid diet.

## 2022-09-27 NOTE — H&P (Signed)
History and Physical      Ove Amat [id number] DOB: 30-Dec-1960 DOA: 09/27/2022  PCP: Ginger Organ., MD  Patient coming from: home   I have personally briefly reviewed patient's old medical records in [place]  Chief Complaint: bright red blood per rectum  HPI: Kordale Oba is a 62 y.o. male with medical history significant for lower gastrointestinal bleed in September 2023, coronary artery disease status post DES to the LAD in January 2023, hyperlipidemia, who is admitted to [facility] on 09/27/2022 with suspected acute lower gastrointestinal bleed after presenting from home to [facility] ED complaining of bright red blood per rectum.   The patient reports 1 day of recurrent episodes of hematochezia, with most recent such episode occurring in the emergency department this evening.  Denies any associated melena, abdominal pain.  No recent trauma.  Denies any associated nausea, vomiting, hematemesis.  In the setting of a history of coronary artery disease status for drug-eluting stent to the LAD in January 2023, he is on daily Plavix, with most recent dose occurring on the morning of 09/26/2022, but otherwise on no additional blood thinners.  Denies any recent chest pain, shortness of breath, palpitations, diaphoresis, dizziness, presyncope, or syncope.  Denies any routine or recent consumption of alcohol or any known history of underlying liver disease.  No use of NSAIDs.  Of note, the patient was hospitalized in September 2023 for acute lower gastrointestinal bleed with similar presentation to that with which she presents this evening.  It was felt to be diverticular in nature.  Of note, per chart review, at the time of the September 2023 hospitalization, his initial hemoglobin was found to be 15.6, which slightly trended down over the course of the hospitalization, the final hemoglobin associate with his previous hospitalization to have been 13.2.    ED Course:   Vital signs in the ED were notable for the following: Afebrile; initial heart rates in the 90s, sessile improving into the 60s following interval administration of 1 L NS bolus; systolic blood pressures initially in the low 100s, subsequently increasing into the 130s following aforementioned IV fluid administration; respiratory rate 18, oxygen saturation 96 to 99% on room air.  Labs were notable for the following: CMP notable for the following: Sodium 135, BUN 14, creatinine 1.14 compared to most recent prior value of 1.01 on 04/21/2022, liver enzymes within normal limits.  CBC notable for white blood cell count 11,700, hemoglobin 16 assist with nursing/chronic problems as well as on elevated RDW, platelet count 347.  INR 1.0.  Type and screen initiated.  EDP contacted the on-call [facility] physician, Dr. Carlean Purl, Who will formally consult and see the patient in the morning, with additional recommendations pending at that time.  While in the ED, the following were administered: Normal saline x 1 L bolus.  Subsequently, the patient was admitted for further evaluation and management of presenting suspected acute lower gastrointestinal bleed.    Review of Systems: As per HPI otherwise 10 point review of systems negative.   Past Medical History:  Diagnosis Date   Allergy    CAD (coronary artery disease)    s/p DES to mLAD 08/31/21   Cancer ([place]) 11/2019   s/p prostatectomy   Complication of anesthesia    took a long time to wear off    Gout    High cholesterol    Crestor    Past Surgical History:  Procedure Laterality Date   ANKLE ARTHROSCOPY     "  had it cleaned out"   CARDIAC CATHETERIZATION     COLONOSCOPY  10/14/2014   KNEE ARTHROSCOPY  07/2019   KNEE ARTHROSCOPY  1980s   LEFT HEART CATH AND CORONARY ANGIOGRAPHY N/A 08/31/2021   Procedure: LEFT HEART CATH AND CORONARY ANGIOGRAPHY;  Surgeon: Burnell Blanks, MD;  Location: [facility];  Service: Cardiovascular;   Laterality: N/A;   LYMPHADENECTOMY Bilateral 02/28/2020   Procedure: LYMPHADENECTOMY, PELVIC;  Surgeon: Raynelle Bring, MD;  Location: [facility];  Service: Urology;  Laterality: Bilateral;   ROBOT ASSISTED LAPAROSCOPIC RADICAL PROSTATECTOMY N/A 02/28/2020   Procedure: XI ROBOTIC ASSISTED LAPAROSCOPIC RADICAL PROSTATECTOMY LEVEL 2;  Surgeon: Raynelle Bring, MD;  Location: [facility];  Service: Urology;  Laterality: N/A;    Social History:  reports that he has never smoked. He has never used smokeless tobacco. He reports that he does not currently use alcohol. He reports that he does not use drugs.   Allergies  Allergen Reactions   Colchicine Diarrhea and Nausea And Vomiting    Family History  Problem Relation Age of Onset   Rheumatic fever Father 53       Died from complications of RF but might have been a heart attack   Colon cancer Neg Hx    Rectal cancer Neg Hx    Stomach cancer Neg Hx    Esophageal cancer Neg Hx     Family history reviewed and not pertinent    Prior to Admission medications   Medication Sig Start Date End Date Taking? Authorizing Provider  allopurinol (ZYLOPRIM) 300 MG tablet Take 300 mg by mouth daily. 11/28/19   [provider]  cephALEXin (KEFLEX) 500 MG capsule Take 1 capsule (500 mg total) by mouth 2 (two) times daily. 08/24/22   Flossie Dibble, NP  clopidogrel (PLAVIX) 75 MG tablet Take 1 tablet (75 mg total) by mouth daily. 05/21/22 05/21/23  Lorretta Harp, MD  nitroGLYCERIN (NITROSTAT) 0.4 MG SL tablet Place 1 tablet (0.4 mg total) under the tongue every 5 (five) minutes x 3 doses as needed for chest pain. 09/01/21   Bhagat, Crista Luria, PA  rosuvastatin (CRESTOR) 40 MG tablet Take 1 tablet (40 mg total) by mouth at bedtime. 06/28/22   [name], PA     Objective    Physical Exam: Vitals:   09/27/22 0129 09/27/22 0141 09/27/22 0245 09/27/22 0330  BP: 99/74  135/72 129/77  Pulse: 97  62 62  Resp: 18  18 16  $ Temp: 98.7 F (37.1  C)     SpO2: 99%  96% 96%  Weight:  88.5 kg    Height:  5' 11"$  (1.803 m)      General: appears to be stated age; alert, oriented Skin: warm, dry, no rash Head:  AT/[place] Mouth:  Oral mucosa membranes appear dry, normal dentition Neck: supple; trachea midline Heart:  RRR; did not appreciate any M/R/G Lungs: CTAB, did not appreciate any wheezes, rales, or rhonchi Abdomen: + BS; soft, ND, NT Vascular: 2+ pedal pulses b/l; 2+ radial pulses b/l Extremities: no peripheral edema, no muscle wasting Neuro: strength and sensation intact in upper and lower extremities b/l    Labs on Admission: I have personally reviewed following labs and imaging studies  CBC: Recent Labs  Lab 09/27/22 0142  WBC 11.7*  HGB 16.0  HCT 49.0  MCV 92.1  PLT [id number]   Basic Metabolic Panel: Recent Labs  Lab 09/27/22 0142  NA 135  K 4.3  CL 103  CO2 23  GLUCOSE 124*  BUN 14  CREATININE 1.14  CALCIUM 8.8*   GFR: Estimated Creatinine Clearance: 72.5 mL/min (by C-G formula based on SCr of 1.14 mg/dL). Liver Function Tests: Recent Labs  Lab 09/27/22 0142  AST 45*  ALT 38  ALKPHOS 86  BILITOT 0.6  PROT 6.5  ALBUMIN 3.6   No results for input(s): "LIPASE", "AMYLASE" in the last 168 hours. No results for input(s): "AMMONIA" in the last 168 hours. Coagulation Profile: Recent Labs  Lab 09/27/22 0142  INR 1.0   Cardiac Enzymes: No results for input(s): "CKTOTAL", "CKMB", "CKMBINDEX", "TROPONINI" in the last 168 hours. BNP (last 3 results) No results for input(s): "PROBNP" in the last 8760 hours. HbA1C: No results for input(s): "HGBA1C" in the last 72 hours. CBG: Recent Labs  Lab 09/27/22 0140  GLUCAP 122*   Lipid Profile: No results for input(s): "CHOL", "HDL", "LDLCALC", "TRIG", "CHOLHDL", "LDLDIRECT" in the last 72 hours. Thyroid Function Tests: No results for input(s): "TSH", "T4TOTAL", "FREET4", "T3FREE", "THYROIDAB" in the last 72 hours. Anemia Panel: No results for input(s):  "VITAMINB12", "FOLATE", "FERRITIN", "TIBC", "IRON", "RETICCTPCT" in the last 72 hours. Urine analysis: No results found for: "COLORURINE", "APPEARANCEUR", "LABSPEC", "PHURINE", "GLUCOSEU", "HGBUR", "BILIRUBINUR", "KETONESUR", "PROTEINUR", "UROBILINOGEN", "NITRITE", "LEUKOCYTESUR"  Radiological Exams on Admission: No results found.    Assessment/Plan   Principal Problem:   Acute lower GI bleeding Active Problems:   HLD (hyperlipidemia)   Hematochezia   Leukocytosis     #) Acute lower GI bleed: diagnosis on the basis of 1 day of hematochezia, with evidence of gross blood per most recent such episode observed in the emergency department this evening. A non-elevated presenting BUN further substantiates suspicion of a lower GI source as opposed to presentation representing a rapid upper gastrointestinal bleed.  Hemoglobin appears to be at baseline, noting presenting value of 16.0.   He is on daily Plavix as an outpatient in the setting of drug-eluting stent to the LAD in January 2023, noting that the duration of his more into platelet use via Plavix may be trying to close given that he is now outside of 1 year from the timing of placement of this drug-eluting stent.  Otherwise, not on any blood thinners as an outpatient, including no concomitant aspirin.  No reported history of alcohol abuse. No known history of liver disease to warrant SBP prophylaxis. INR 1.0.  Differential appears to favor diverticular bleed given hospitalization in September 2023 for acute lower gastrointestinal bleed, felt to be diverticular in nature at that time.  Following aforementioned 1 L normal saline, patient appears hemodynamically stable with normotensive/non-tachycardic vital signs , and is currently asymptomatic.   Given suspected lower GI source, there does not appear to be an indication for initiation of Protonix drip. Of note, patient was typed and screened in the ED today.  EDP consulted on-call  gastroenterologist, with additional recs pending at this time, as further detailed above.  There does not appear to be an indication for PBC transfusion at this time, but will closely monitor in setting of vital signs, H&H trend, clinical status to further monitor for this possibility, as further detailed below.   Plan: NPO. Refraining from pharmacologic DVT prophylaxis. Monitor on telemetry. Monitor continuous pulse-ox. Maintain at least 2 large bore IV's. Q4H H&H's have been ordered through 1300 on 2/12. Will closely monitor these ensuing Hgb levels and correlate these data points with the patient's overall clinical picture including vital signs to determine need for subsequent transfusion. [facility] consulted, and  will see the patient in the morning, with additional recommendations pending at this time.  Hold home Plavix for now.  Add on PTT.  Normal saline at 100 cc/h.             #) Leukocytosis: Presenting CBC reflects mildly elevated white cell count of 11,700. Suspect an element of hemoconcentration in the setting of acute gastrointestinal bleed. This may also be reactive/inflammatory in nature in the setting of presenting acute lower gastrointestinal bleed. No evidence to suggest underlying infectious process at this time, including no report of any acute urinary or respiratory symptoms.  No additional SIRS criteria met at this time. Overall, criteria for sepsis are not currently met. Appears hemodynamically stable.  Therefore, will refrain from initiation of antibiotics at this time.   Plan: Repeat CBC with diff in the morning.  Monitor strict I's and O's, daily weights.  Further evaluation management of presenting acute lower gastrointestinal bleed, as above.  Continuous normal saline, as above.              #) Hyperlipidemia: documented h/o such. On high intensity rosuvastatin and as outpatient.   Plan: In the setting of current n.p.o. status, will hold home statin for  now.         DVT prophylaxis: SCD's   Code Status: Full code Family Communication: none Disposition Plan: Per Rounding Team Consults called:  EDP contacted on-call [place] gastroenterology, Dr. Carlean Purl, who will formally consult and see the patient in the morning, as further detailed above;  Admission status: Observation     I SPENT GREATER THAN 75  MINUTES IN CLINICAL CARE TIME/MEDICAL DECISION-MAKING IN COMPLETING THIS ADMISSION.      [place] DO [facility]  From [place]   09/27/2022, 3:32 AM

## 2022-09-27 NOTE — ED Provider Notes (Signed)
[facility] Provider Note   CSN: [id number] Arrival date & time: 09/27/22  0116     History  Chief Complaint  Patient presents with   Rectal Bleeding   Loss of Consciousness    Kevin Wallace is a 62 y.o. male.  62 y/o male with hx of CAD (s/p LAD stent in 08/2021), HTN, HLD, OSA, gout, prostate cancer presents to the ED for evaluation of hematochezia onset at 1900.  States he has having hematochezia approximately every hour since onset.  Does report associated lightheadedness.  Was reported to have had a brief syncopal episode while in triage which lasted approximately 20 seconds.  Reports that his symptoms were preceded by URI symptoms.  He has not had any nausea, vomiting, abdominal pain, diarrhea, fevers.  Hx of diverticular bleed in September 2023. Resolved w/o intervention. On plavix, but no other chronic anticoagulant. Follows with [facility]  The history is provided by the patient. No language interpreter was used.  Rectal Bleeding Loss of Consciousness      Home Medications Prior to Admission medications   Medication Sig Start Date End Date Taking? Authorizing Provider  allopurinol (ZYLOPRIM) 300 MG tablet Take 300 mg by mouth daily. 11/28/19   [provider]  cephALEXin (KEFLEX) 500 MG capsule Take 1 capsule (500 mg total) by mouth 2 (two) times daily. 08/24/22   Flossie Dibble, NP  clopidogrel (PLAVIX) 75 MG tablet Take 1 tablet (75 mg total) by mouth daily. 05/21/22 05/21/23  Lorretta Harp, MD  nitroGLYCERIN (NITROSTAT) 0.4 MG SL tablet Place 1 tablet (0.4 mg total) under the tongue every 5 (five) minutes x 3 doses as needed for chest pain. 09/01/21   Bhagat, Crista Luria, PA  rosuvastatin (CRESTOR) 40 MG tablet Take 1 tablet (40 mg total) by mouth at bedtime. 06/28/22   [name], PA      Allergies    Colchicine    Review of Systems   Review of Systems  Cardiovascular:  Positive for syncope.   Gastrointestinal:  Positive for hematochezia.  Ten systems reviewed and are negative for acute change, except as noted in the HPI.    Physical Exam Updated Vital Signs BP 135/72   Pulse 62   Temp 98.7 F (37.1 C)   Resp 18   Ht 5' 11"$  (1.803 m)   Wt 88.5 kg   SpO2 96%   BMI 27.20 kg/m   Physical Exam Vitals and nursing note reviewed.  Constitutional:      General: He is not in acute distress.    Appearance: He is well-developed. He is not diaphoretic.     Comments: Slightly pale in appearance. Nontoxic.   HENT:     Head: Normocephalic and atraumatic.  Eyes:     General: No scleral icterus.    Conjunctiva/sclera: Conjunctivae normal.  Cardiovascular:     Rate and Rhythm: Normal rate and regular rhythm.     Pulses: Normal pulses.  Pulmonary:     Effort: Pulmonary effort is normal. No respiratory distress.     Comments: Respirations even and unlabored Abdominal:     Comments: Abdomen soft, nondistended. No focal TTP.  Genitourinary:    Comments: RN tech in room during evaluation. Chux soaked with hematochezia and clots. Musculoskeletal:        General: Normal range of motion.     Cervical back: Normal range of motion.  Skin:    General: Skin is warm and dry.  Coloration: Skin is not pale.     Findings: No erythema or rash.  Neurological:     Mental Status: He is alert and oriented to person, place, and time.  Psychiatric:        Behavior: Behavior normal.     ED Results / Procedures / Treatments   Labs (all labs ordered are listed, but only abnormal results are displayed) Labs Reviewed  COMPREHENSIVE METABOLIC PANEL - Abnormal; Notable for the following components:      Result Value   Glucose, Bld 124 (*)    Calcium 8.8 (*)    AST 45 (*)    All other components within normal limits  CBC - Abnormal; Notable for the following components:   WBC 11.7 (*)    All other components within normal limits  CBG MONITORING, ED - Abnormal; Notable for the  following components:   Glucose-Capillary 122 (*)    All other components within normal limits  PROTIME-INR  CBC WITH DIFFERENTIAL/PLATELET  COMPREHENSIVE METABOLIC PANEL  MAGNESIUM  HEMOGLOBIN AND HEMATOCRIT, BLOOD  HEMOGLOBIN AND HEMATOCRIT, BLOOD  TYPE AND SCREEN    EKG None  Radiology No results found.  Procedures Procedures    Medications Ordered in ED Medications  acetaminophen (TYLENOL) tablet 650 mg (has no administration in time range)    Or  acetaminophen (TYLENOL) suppository 650 mg (has no administration in time range)  0.9 %  sodium chloride infusion (has no administration in time range)  sodium chloride 0.9 % bolus 1,000 mL (1,000 mLs Intravenous New Bag/Given 09/27/22 0201)    ED Course/ Medical Decision Making/ A&P Clinical Course as of 09/27/22 0324  Mon Sep 27, 2022  0202 Colonoscopy 12/2019: - One 4 mm polyp at the ileocecal valve, removed with a cold snare. Resected and retrieved. - Severe diverticulosis from ascending colon to sigmoid colon. There was no evidence of diverticular bleeding. - Internal hemorrhoids [KH]  0253 Hemoglobin is 16 today which appears c/w baseline. Appears to have had 2.5g hemoglobin drop with prior lower GIB in September. Will continue to need CBC trending with ongoing hematochezia.   Consult placed to hospitalist for admission. Dr. Carlean Purl added to treatment team and notified of need for inpatient consultation via [place]. [KH]  [phone] Case discussed with Dr. Velia Meyer who will admit. [KH]    Clinical Course User Index [KH] Antonietta Breach, PA-C                             Medical Decision Making Amount and/or Complexity of Data Reviewed Labs: ordered.  Risk Decision regarding hospitalization.   This patient presents to the ED for concern of hematochezia, this involves an extensive number of treatment options, and is a complaint that carries with it a high risk of complications and morbidity.  The differential  diagnosis includes diverticular bleed vs tumor vs hemorrhoids   Co morbidities that complicate the patient evaluation  CAD HTN HLD   Additional history obtained:  External records from outside source obtained and reviewed including colonoscopy from 2021, CT angio from 04/2022 suggestive of diverticular bleed   Lab Tests:  I Ordered, and personally interpreted labs.  The pertinent results include:  Stable Hgb of 16. INR 1.0. Mild leukocytosis of 11.7 likely due to GIB.   Cardiac Monitoring:  The patient was maintained on a cardiac monitor.  I personally viewed and interpreted the cardiac monitored which showed an underlying rhythm of: NSR  Medicines ordered and prescription drug management:  I ordered medication including IVF for supportive care  Reevaluation of the patient after these medicines showed that the patient  remained stable I have reviewed the patients home medicines and have made adjustments as needed   Test Considered:  Tagged RBC scan   Consultations Obtained:  I requested consultation with the hospitalist service for admission. Gastroenterology notified of need for inpatient consultation via [place].   Problem List / ED Course:  As above   Reevaluation:  After the interventions noted above, I reevaluated the patient and found that they have : remained stable   Social Determinants of Health:  Insured patient   Dispostion:  After consideration of the diagnostic results and the patients response to treatment, I feel that the patent would benefit from admission for CBC trending, GI consultation. Dr. Velia Meyer of [place] to admit.          Final Clinical Impression(s) / ED Diagnoses Final diagnoses:  Lower GI bleed    Rx / DC Orders ED Discharge Orders     None         Antonietta Breach, PA-C 09/27/22 0325    Fatima Blank, MD 09/27/22 [phone]

## 2022-09-28 ENCOUNTER — Observation Stay (HOSPITAL_COMMUNITY): Payer: No Typology Code available for payment source

## 2022-09-28 ENCOUNTER — Encounter: Payer: Self-pay | Admitting: Cardiovascular Disease

## 2022-09-28 DIAGNOSIS — E782 Mixed hyperlipidemia: Secondary | ICD-10-CM | POA: Diagnosis not present

## 2022-09-28 DIAGNOSIS — K922 Gastrointestinal hemorrhage, unspecified: Secondary | ICD-10-CM | POA: Diagnosis not present

## 2022-09-28 DIAGNOSIS — D72829 Elevated white blood cell count, unspecified: Secondary | ICD-10-CM | POA: Diagnosis not present

## 2022-09-28 DIAGNOSIS — K921 Melena: Secondary | ICD-10-CM | POA: Diagnosis not present

## 2022-09-28 DIAGNOSIS — K5731 Diverticulosis of large intestine without perforation or abscess with bleeding: Secondary | ICD-10-CM | POA: Diagnosis not present

## 2022-09-28 HISTORY — PX: IR US GUIDE VASC ACCESS RIGHT: IMG2390

## 2022-09-28 HISTORY — PX: IR ANGIOGRAM VISCERAL SELECTIVE: IMG657

## 2022-09-28 LAB — HEMOGLOBIN AND HEMATOCRIT, BLOOD
HCT: 41.9 % (ref 39.0–52.0)
HCT: 38 % — ABNORMAL LOW (ref 39.0–52.0)
Hemoglobin: 14.4 g/dL (ref 13.0–17.0)
Hemoglobin: 13.2 g/dL (ref 13.0–17.0)

## 2022-09-28 LAB — CBC
HCT: 41.3 % (ref 39.0–52.0)
Hemoglobin: 13.6 g/dL (ref 13.0–17.0)
MCH: 30.2 pg (ref 26.0–34.0)
MCHC: 32.9 g/dL (ref 30.0–36.0)
MCV: 91.6 fL (ref 80.0–100.0)
Platelets: 276 10*3/uL (ref 150–400)
RBC: 4.51 MIL/uL (ref 4.22–5.81)
RDW: 13.3 % (ref 11.5–15.5)
WBC: 9.7 10*3/uL (ref 4.0–10.5)
nRBC: 0 % (ref 0.0–0.2)

## 2022-09-28 MED ORDER — FENTANYL CITRATE (PF) 100 MCG/2ML IJ SOLN
INTRAMUSCULAR | Status: AC | PRN
  Administered 2022-09-28: 50 ug via INTRAVENOUS

## 2022-09-28 MED ORDER — IOHEXOL 350 MG/ML SOLN
75.0000 mL | Freq: Once | INTRAVENOUS | Status: AC | PRN
  Administered 2022-09-28: 75 mL via INTRAVENOUS

## 2022-09-28 MED ORDER — LIDOCAINE HCL 1 % IJ SOLN
INTRAMUSCULAR | Status: AC
  Filled 2022-09-28: qty 20

## 2022-09-28 MED ORDER — MIDAZOLAM HCL 2 MG/2ML IJ SOLN
INTRAMUSCULAR | Status: AC
  Filled 2022-09-28: qty 2

## 2022-09-28 MED ORDER — MIDAZOLAM HCL 2 MG/2ML IJ SOLN
INTRAMUSCULAR | Status: AC | PRN
  Administered 2022-09-28: 1 mg via INTRAVENOUS

## 2022-09-28 MED ORDER — OXYCODONE HCL 5 MG PO TABS
2.5000 mg | ORAL_TABLET | Freq: Four times a day (QID) | ORAL | Status: DC | PRN
  Administered 2022-09-28: 2.5 mg via ORAL
  Filled 2022-09-28: qty 1

## 2022-09-28 MED ORDER — FENTANYL CITRATE (PF) 100 MCG/2ML IJ SOLN
INTRAMUSCULAR | Status: AC
  Filled 2022-09-28: qty 2

## 2022-09-28 MED ORDER — IOHEXOL 300 MG/ML  SOLN
150.0000 mL | Freq: Once | INTRAMUSCULAR | Status: AC | PRN
  Administered 2022-09-28: 25 mL via INTRA_ARTERIAL

## 2022-09-28 MED ORDER — ONDANSETRON HCL 4 MG/2ML IJ SOLN
INTRAMUSCULAR | Status: AC | PRN
  Administered 2022-09-28: 4 mg via INTRAVENOUS

## 2022-09-28 MED ORDER — ONDANSETRON HCL 4 MG/2ML IJ SOLN
INTRAMUSCULAR | Status: AC
  Filled 2022-09-28: qty 2

## 2022-09-28 NOTE — Progress Notes (Signed)
[facility] ROUNDING NOTE   Subjective: Patient uneventful night last night with no bowel movements.  His diet was advanced this morning in anticipation of possible discharge today.  However, late this morning, he experienced a large bloody bowel movement.  This was followed by numerous successive large bloody bowel movements with associated light-headedness.  It was recommended the patient proceed with CT-A   Objective: Vital signs in last 24 hours: Temp:  [98 F (36.7 C)-99 F (37.2 C)] 99 F (37.2 C) (02/13 1558) Pulse Rate:  [55-83] 77 (02/13 1558) Resp:  [16-17] 17 (02/13 1558) BP: (116-143)/(65-81) 143/81 (02/13 1558) SpO2:  [97 %-99 %] 98 % (02/13 1558) Last BM Date : 09/27/22 General: NAD, accompanied by wife, daughter Lungs:  CTA b/l, no w/r/r Heart:  RRR, no m/r/g Abdomen:  Soft, NT, ND, +BS Ext:  No c/c/e    Intake/Output from previous day: 02/12 0701 - 02/13 0700 In: 0  Out: 300 [Urine:300] Intake/Output this shift: Total I/O In: 240 [P.O.:240] Out: -    Lab Results: Recent Labs    09/27/22 0142 09/27/22 0400 09/27/22 0949 09/27/22 2052 09/28/22 0406 09/28/22 1210  WBC 11.7* 10.7*  --   --  9.7  --   HGB 16.0 14.3   < > 13.9 13.6 14.4  PLT 347 263  --   --  276  --   MCV 92.1 90.7  --   --  91.6  --    < > = values in this interval not displayed.   BMET Recent Labs    09/27/22 0142 09/27/22 0400  NA 135 135  K 4.3 4.5  CL 103 105  CO2 23 24  GLUCOSE 124* 117*  BUN 14 15  CREATININE 1.14 1.01  CALCIUM 8.8* 8.0*   LFT Recent Labs    09/27/22 0142 09/27/22 0400  PROT 6.5 5.6*  ALBUMIN 3.6 3.1*  AST 45* 41  ALT 38 36  ALKPHOS 86 70  BILITOT 0.6 0.4   PT/INR Recent Labs    09/27/22 0142  INR 1.0      Imaging/Other results: CT ANGIO GI BLEED  Result Date: 09/28/2022 CLINICAL DATA:  Lower gastrointestinal bleeding. EXAM: CTA ABDOMEN AND PELVIS WITHOUT AND WITH CONTRAST TECHNIQUE: Multidetector CT imaging of the  abdomen and pelvis was performed using the standard protocol during bolus administration of intravenous contrast. Multiplanar reconstructed images and MIPs were obtained and reviewed to evaluate the vascular anatomy. RADIATION DOSE REDUCTION: This exam was performed according to the departmental dose-optimization program which includes automated exposure control, adjustment of the mA and/or kV according to patient size and/or use of iterative reconstruction technique. CONTRAST:  40m OMNIPAQUE IOHEXOL 350 MG/ML SOLN COMPARISON:  April 19, 2022. FINDINGS: VASCULAR Aorta: Atherosclerosis of abdominal aorta is noted with probable large atherosclerotic ulcer. No definite aneurysm or dissection is noted. Celiac: Patent without evidence of aneurysm, dissection, vasculitis or significant stenosis. SMA: Severe stenosis is noted in the proximal portion without thrombus. Renals: Both renal arteries are patent without evidence of aneurysm, dissection, vasculitis, fibromuscular dysplasia or significant stenosis. IMA: Patent without evidence of aneurysm, dissection, vasculitis or significant stenosis. Inflow: Patent without evidence of aneurysm, dissection, vasculitis or significant stenosis. Proximal Outflow: Bilateral common femoral and visualized portions of the superficial and profunda femoral arteries are patent without evidence of aneurysm, dissection, vasculitis or significant stenosis. Veins: No obvious venous abnormality within the limitations of this arterial phase study. Review of the MIP images confirms the above findings. NON-VASCULAR Lower chest:  No acute abnormality. Hepatobiliary: No focal liver abnormality is seen. No gallstones, gallbladder wall thickening, or biliary dilatation. Pancreas: Unremarkable. No pancreatic ductal dilatation or surrounding inflammatory changes. Spleen: Normal in size without focal abnormality. Adrenals/Urinary Tract: Adrenal glands are unremarkable. Kidneys are normal, without  renal calculi, focal lesion, or hydronephrosis. Bladder is unremarkable. Stomach/Bowel: The stomach and appendix are unremarkable. There is no evidence of bowel obstruction or inflammation. Diverticulosis of sigmoid and descending colon is noted. Active contrast extravasation is seen in the proximal sigmoid colon consistent with gastrointestinal hemorrhage. Lymphatic: No adenopathy is noted. Reproductive: Status post prostatectomy. Other: No abdominal wall hernia or abnormality. No abdominopelvic ascites. Musculoskeletal: No acute or significant osseous findings. IMPRESSION: VASCULAR There is active contrast extravasation seen in the proximal sigmoid colon consistent with gastrointestinal hemorrhage. Critical Value/emergent results were called by telephone at the time of interpretation on 09/28/2022 at 5:49 pm to provider [facility] , who verbally acknowledged these results. Severe stenosis is again noted involving the origin of the superior mesenteric artery. Atherosclerosis of abdominal aorta is noted with large penetrating atherosclerotic ulcer seen in infrarenal portion. NON-VASCULAR Diverticulosis of descending and sigmoid colon is noted. Aortic Atherosclerosis (ICD10-I70.0). Electronically Signed   By: Marijo Conception M.D.   On: 09/28/2022 17:49      Assessment and Plan:  62 year old male with CAD s/p stent on Plavix, with history of diverticular bleed in September 2023 (sigmoid colon, confirmed with CT-A) which resolved without intervention, who presented again with profuse painless hematochezia.  After going nearly 16 hours without a bowel movement overnight, he again experienced profuse bleeding this afternoon.  A CT-A was completed and showed active hemorrhage in the proximal sigmoid colon.   He is to undergo embolization by IR this evening.  Diverticular hemorrhage - Embolization/coiling by IR today  GI will sign off, please contact us if he continues to bleed to consider endoscopic attempt at  hemostasis   Daryel November, MD  09/28/2022, 6:11 PM [facility]

## 2022-09-28 NOTE — TOC CM/SW Note (Signed)
  Transition of Care [facility]) Screening Note   Patient Details  Name: Kevin Wallace Date of Birth: 1961-01-16   Transition of Care Department [facility]) has reviewed patient and no TOC needs have been identified at this time. We will continue to monitor patient advancement through interdisciplinary progression rounds. If new patient transition needs arise, please place a TOC consult.

## 2022-09-28 NOTE — Care Plan (Signed)
Made call to CT to check status of stat CT at 1217.  Answerer states she thought he needed an IV.  Pt has a 20g working IV in [employer].  RN told caller that no one has called to check status or question IV status.  In the meantime, pt has had 5 bloody Bms in addition to the 3 he had this am, hence the stat indication.  She states she will send for him now.  Sreece, RN

## 2022-09-28 NOTE — Progress Notes (Signed)
PROGRESS NOTE  Kevin Wallace [id number] DOB: Feb 22, 1961   PCP: Ginger Organ., MD  Patient is from: Home.  Lives with his wife.  DOA: 09/27/2022 LOS: 0  Chief complaints Chief Complaint  Patient presents with   Rectal Bleeding   Loss of Consciousness     Brief Narrative / Interim history: 62 year old M with history of CAD s/p DES to LAD in 08/2021, diverticulosis, previous GI bleed thought to be diverticular and hyperlipidemia presenting with 7 episodes of bright red blood per rectum for about 6 hours prior to arrival to ED, and admitted for rectal bleed.  Patient is on Plavix.  No abdominal pain.  Hgb 16.  Hemodynamically stable.  GI consulted and following.    Subjective: Seen and examined earlier this morning.  No major events overnight.  Has not had bowel movement yesterday or last night.  Hemoglobin was stable.  Diet advanced to soft after discussion with GI.  However, patient has had recurrent bright red blood per rectum later in the morning.  Evaluated by GI.  Diet changed back to clear liquid.  Objective: Vitals:   09/27/22 1958 09/28/22 0006 09/28/22 0455 09/28/22 0926  BP: 128/72 116/65 130/71 139/80  Pulse: 62 (!) 55 79 83  Resp: 16  16 17  $ Temp: 98.8 F (37.1 C) 98.5 F (36.9 C) 98 F (36.7 C) 98 F (36.7 C)  TempSrc: Oral Oral Oral Oral  SpO2: 98% 98% 97% 99%  Weight:      Height:        Examination:  GENERAL: No apparent distress.  Nontoxic. HEENT: MMM.  Vision and hearing grossly intact.  NECK: Supple.  No apparent JVD.  RESP:  No IWOB.  Fair aeration bilaterally. CVS:  RRR. Heart sounds normal.  ABD/GI/GU: BS+. Abd soft, NTND.  MSK/EXT:   No apparent deformity. Moves extremities. No edema.  SKIN: no apparent skin lesion or wound NEURO: Awake and alert. Oriented appropriately.  No apparent focal neuro deficit. PSYCH: Calm. Normal affect.   Procedures:  None  Microbiology summarized: None  Assessment and plan: Principal Problem:    Acute lower GI bleeding Active Problems:   HLD (hyperlipidemia)   Hematochezia   Leukocytosis  Acute lower GI bleeding:  likely diverticular.  Has history of severe diverticulosis.  Hospitalized for the same about 5 months ago.  CT angio at that time localized bleeding to sigmoid colon.  Recurrent hematochezia this morning again.  He felt lightheaded.  Vital stable. Recent Labs    04/19/22 1145 04/20/22 0301 04/20/22 1606 04/21/22 1006 09/27/22 0142 09/27/22 0400 09/27/22 0949 09/27/22 1353 09/27/22 2052 09/28/22 0406  HGB 15.8 14.4 13.5 13.2 16.0 14.3 13.9 13.8 13.9 13.6  -GI following-appreciate recommendation -Monitor H&H every 6 hours.  Transfuse for Hgb less than 8 given CAD. -CT angio GI bleed stat.  Will consult IR if positive. -Type and screen. -Continue holding Plavix.  History of CAD s/p LAD in 08/2021.  On Plavix at home. -Continue holding Plavix -Continue home Crestor  Hyperlipidemia -Continue home Crestor  History of gout -Continue home allopurinol  Leukocytosis: Likely demargination.  Resolved.  Body mass index is 27.2 kg/m.          DVT prophylaxis:  SCDs Start: 09/27/22 [id number]  Code Status: Full code Family Communication: None at bedside this morning. Level of care: Telemetry Medical Status is: Inpatient The patient will require care spanning > 2 midnights and should be moved to inpatient because: Due to recurrent hematochezia   Final  disposition: Home once medically cleared Consultants:  GI  30 minutes with more than 50% spent in reviewing records, counseling patient/family and coordinating care.   Sch Meds:  Scheduled Meds:  allopurinol  300 mg Oral Daily   rosuvastatin  40 mg Oral QHS   Continuous Infusions:   PRN Meds:.acetaminophen **OR** acetaminophen  Antimicrobials: Anti-infectives (From admission, onward)    None        I have personally reviewed the following labs and images: CBC: Recent Labs  Lab  09/27/22 0142 09/27/22 0400 09/27/22 0949 09/27/22 1353 09/27/22 2052 09/28/22 0406  WBC 11.7* 10.7*  --   --   --  9.7  NEUTROABS  --  8.4*  --   --   --   --   HGB 16.0 14.3 13.9 13.8 13.9 13.6  HCT 49.0 41.8 40.9 41.7 40.1 41.3  MCV 92.1 90.7  --   --   --  91.6  PLT 347 263  --   --   --  276   BMP &GFR Recent Labs  Lab 09/27/22 0142 09/27/22 0400  NA 135 135  K 4.3 4.5  CL 103 105  CO2 23 24  GLUCOSE 124* 117*  BUN 14 15  CREATININE 1.14 1.01  CALCIUM 8.8* 8.0*  MG  --  2.0   Estimated Creatinine Clearance: 81.8 mL/min (by C-G formula based on SCr of 1.01 mg/dL). Liver & Pancreas: Recent Labs  Lab 09/27/22 0142 09/27/22 0400  AST 45* 41  ALT 38 36  ALKPHOS 86 70  BILITOT 0.6 0.4  PROT 6.5 5.6*  ALBUMIN 3.6 3.1*   No results for input(s): "LIPASE", "AMYLASE" in the last 168 hours. No results for input(s): "AMMONIA" in the last 168 hours. Diabetic: No results for input(s): "HGBA1C" in the last 72 hours. Recent Labs  Lab 09/27/22 0140  GLUCAP 122*   Cardiac Enzymes: No results for input(s): "CKTOTAL", "CKMB", "CKMBINDEX", "TROPONINI" in the last 168 hours. No results for input(s): "PROBNP" in the last 8760 hours. Coagulation Profile: Recent Labs  Lab 09/27/22 0142  INR 1.0   Thyroid Function Tests: No results for input(s): "TSH", "T4TOTAL", "FREET4", "T3FREE", "THYROIDAB" in the last 72 hours. Lipid Profile: No results for input(s): "CHOL", "HDL", "LDLCALC", "TRIG", "CHOLHDL", "LDLDIRECT" in the last 72 hours. Anemia Panel: No results for input(s): "VITAMINB12", "FOLATE", "FERRITIN", "TIBC", "IRON", "RETICCTPCT" in the last 72 hours. Urine analysis: No results found for: "COLORURINE", "APPEARANCEUR", "LABSPEC", "PHURINE", "GLUCOSEU", "HGBUR", "BILIRUBINUR", "KETONESUR", "PROTEINUR", "UROBILINOGEN", "NITRITE", "LEUKOCYTESUR" Sepsis Labs: Invalid input(s): "PROCALCITONIN", "LACTICIDVEN"  Microbiology: No results found for this or any previous  visit (from the past 240 hour(s)).  Radiology Studies: No results found.    Kevin Wallace T. Sabin  If 7PM-7AM, please contact night-coverage [email or web address] 09/28/2022, 12:41 PM

## 2022-09-28 NOTE — Procedures (Signed)
Interventional Radiology Procedure Note  Procedure: Inferior Mesenteric Angiogram.    Indication: Acute Sigmoid Colon hemorrhage  Findings:  No active hemorrhage identified. No embolization was performed. Please refer to procedural dictation for full description.  Complications: None  EBL: < 10 mL  Miachel Roux, MD [phone]

## 2022-09-28 NOTE — Care Plan (Signed)
At this time, patient has had a total of 5 bright, flank blood bowel movements.  Significant blood each time.  Patient is asymptomatic at this time.  Latest hgb was 14.2.  Awaiting CT-A.  NADN.  Tele remains NSR. Sreece, RN

## 2022-09-28 NOTE — Care Plan (Signed)
Pt taken by bed and transport to IR.  Sreece, RN

## 2022-09-28 NOTE — Care Plan (Signed)
Pt just left for stat CT. Sreece, RN

## 2022-09-28 NOTE — Progress Notes (Signed)
CT angio positive for extravasation in proximal sigmoid colon.  Discussed with IR, Dr. Dwaine Gale.  Plan for embolization.

## 2022-09-28 NOTE — Consult Note (Signed)
Chief Complaint: Patient was seen in consultation today for  Chief Complaint  Patient presents with   Rectal Bleeding   Loss of Consciousness   at the request of * No referring provider recorded for this case *  Referring Physician(s): Dr. Wendee Beavers  Patient Status: [facility] - In-pt  History of Present Illness: Kevin Wallace is a 62 y.o. gentleman with history of CAD s/p stent placement.  He was on Plavix until two days ago.  He has a prior history of diverticular bleed in September 2023 which resolved without intervention.  He presented again with again with profuse painless hematochezia. After going nearly 16 hours without a bowel movement overnight, he again experienced profuse bleeding this afternoon.  CTA of the abdomen and pelvis shows acute extravasation at the level of the sigmoid colon.  IR consulted for mesenteric angiogram and embolization.   Past Medical History:  Diagnosis Date   Allergy    CAD (coronary artery disease)    s/p DES to mLAD 08/31/21   Cancer ([place]) 11/2019   s/p prostatectomy   Complication of anesthesia    took a long time to wear off    Gout    High cholesterol    Crestor    Past Surgical History:  Procedure Laterality Date   ANKLE ARTHROSCOPY     "had it cleaned out"   CARDIAC CATHETERIZATION     COLONOSCOPY  10/14/2014   KNEE ARTHROSCOPY  07/2019   KNEE ARTHROSCOPY  1980s   LEFT HEART CATH AND CORONARY ANGIOGRAPHY N/A 08/31/2021   Procedure: LEFT HEART CATH AND CORONARY ANGIOGRAPHY;  Surgeon: Burnell Blanks, MD;  Location: [facility];  Service: Cardiovascular;  Laterality: N/A;   LYMPHADENECTOMY Bilateral 02/28/2020   Procedure: LYMPHADENECTOMY, PELVIC;  Surgeon: Raynelle Bring, MD;  Location: [facility];  Service: Urology;  Laterality: Bilateral;   ROBOT ASSISTED LAPAROSCOPIC RADICAL PROSTATECTOMY N/A 02/28/2020   Procedure: XI ROBOTIC ASSISTED LAPAROSCOPIC RADICAL PROSTATECTOMY LEVEL 2;  Surgeon: Raynelle Bring, MD;   Location: [facility];  Service: Urology;  Laterality: N/A;    Allergies: Colchicine  Medications: Prior to Admission medications   Medication Sig Start Date End Date Taking? Authorizing Provider  allopurinol (ZYLOPRIM) 300 MG tablet Take 300 mg by mouth daily. 11/28/19  Yes [provider]  clopidogrel (PLAVIX) 75 MG tablet Take 1 tablet (75 mg total) by mouth daily. 05/21/22 05/21/23 Yes Lorretta Harp, MD  rosuvastatin (CRESTOR) 40 MG tablet Take 1 tablet (40 mg total) by mouth at bedtime. 06/28/22  Yes Bhagat, Bhavinkumar, PA  cephALEXin (KEFLEX) 500 MG capsule Take 1 capsule (500 mg total) by mouth 2 (two) times daily. Patient not taking: Reported on 09/27/2022 08/24/22   Flossie Dibble, NP  nitroGLYCERIN (NITROSTAT) 0.4 MG SL tablet Place 1 tablet (0.4 mg total) under the tongue every 5 (five) minutes x 3 doses as needed for chest pain. 09/01/21   [name], PA     Family History  Problem Relation Age of Onset   Rheumatic fever Father 21       Died from complications of RF but might have been a heart attack   Colon cancer Neg Hx    Rectal cancer Neg Hx    Stomach cancer Neg Hx    Esophageal cancer Neg Hx     Social History   Socioeconomic History   Marital status: Married    Spouse name: [name]   Number of children: 2   Years of education: 26  Highest education level: Master's degree (e.g., MA, MS, MEng, MEd, MSW, MBA)  Occupational History   Occupation: desk work as a Freight forwarder  Tobacco Use   Smoking status: Never   Smokeless tobacco: Never  Vaping Use   Vaping Use: Never used  Substance and Sexual Activity   Alcohol use: Not Currently   Drug use: No   Sexual activity: Yes    Birth control/protection: None  Other Topics Concern   Not on file  Social History Narrative   Lives with wife.     Social Determinants of Health   Financial Resource Strain: Not on file  Food Insecurity: No Food Insecurity (04/20/2022)   Hunger Vital Sign    Worried  About Running Out of Food in the Last Year: Never true    Ran Out of Food in the Last Year: Never true  Transportation Needs: No Transportation Needs (04/20/2022)   PRAPARE - Hydrologist (Medical): No    Lack of Transportation (Non-Medical): No  Physical Activity: Not on file  Stress: Not on file  Social Connections: Not on file   Review of Systems: A 12 point ROS discussed and pertinent positives are indicated in the HPI above.  All other systems are negative.  Review of Systems  Vital Signs: BP (!) 143/81 (BP Location: Right Arm)   Pulse 77   Temp 99 F (37.2 C) (Oral)   Resp 17   Ht 5' 11"$  (1.803 m)   Wt 88.5 kg   SpO2 98%   BMI 27.20 kg/m   Physical Exam Constitutional:      Appearance: Normal appearance. He is normal weight.  Cardiovascular:     Rate and Rhythm: Normal rate.  Pulmonary:     Effort: Pulmonary effort is normal.  Abdominal:     General: Abdomen is flat.     Palpations: Abdomen is soft.  Neurological:     Mental Status: He is oriented to person, place, and time.  Psychiatric:        Mood and Affect: Mood normal.    ASA 2 Mallampati 1  Imaging: CT ANGIO GI BLEED  Result Date: 09/28/2022 CLINICAL DATA:  Lower gastrointestinal bleeding. EXAM: CTA ABDOMEN AND PELVIS WITHOUT AND WITH CONTRAST TECHNIQUE: Multidetector CT imaging of the abdomen and pelvis was performed using the standard protocol during bolus administration of intravenous contrast. Multiplanar reconstructed images and MIPs were obtained and reviewed to evaluate the vascular anatomy. RADIATION DOSE REDUCTION: This exam was performed according to the departmental dose-optimization program which includes automated exposure control, adjustment of the mA and/or kV according to patient size and/or use of iterative reconstruction technique. CONTRAST:  10m OMNIPAQUE IOHEXOL 350 MG/ML SOLN COMPARISON:  April 19, 2022. FINDINGS: VASCULAR Aorta: Atherosclerosis of  abdominal aorta is noted with probable large atherosclerotic ulcer. No definite aneurysm or dissection is noted. Celiac: Patent without evidence of aneurysm, dissection, vasculitis or significant stenosis. SMA: Severe stenosis is noted in the proximal portion without thrombus. Renals: Both renal arteries are patent without evidence of aneurysm, dissection, vasculitis, fibromuscular dysplasia or significant stenosis. IMA: Patent without evidence of aneurysm, dissection, vasculitis or significant stenosis. Inflow: Patent without evidence of aneurysm, dissection, vasculitis or significant stenosis. Proximal Outflow: Bilateral common femoral and visualized portions of the superficial and profunda femoral arteries are patent without evidence of aneurysm, dissection, vasculitis or significant stenosis. Veins: No obvious venous abnormality within the limitations of this arterial phase study. Review of the MIP images confirms the above findings.  NON-VASCULAR Lower chest: No acute abnormality. Hepatobiliary: No focal liver abnormality is seen. No gallstones, gallbladder wall thickening, or biliary dilatation. Pancreas: Unremarkable. No pancreatic ductal dilatation or surrounding inflammatory changes. Spleen: Normal in size without focal abnormality. Adrenals/Urinary Tract: Adrenal glands are unremarkable. Kidneys are normal, without renal calculi, focal lesion, or hydronephrosis. Bladder is unremarkable. Stomach/Bowel: The stomach and appendix are unremarkable. There is no evidence of bowel obstruction or inflammation. Diverticulosis of sigmoid and descending colon is noted. Active contrast extravasation is seen in the proximal sigmoid colon consistent with gastrointestinal hemorrhage. Lymphatic: No adenopathy is noted. Reproductive: Status post prostatectomy. Other: No abdominal wall hernia or abnormality. No abdominopelvic ascites. Musculoskeletal: No acute or significant osseous findings. IMPRESSION: VASCULAR There is  active contrast extravasation seen in the proximal sigmoid colon consistent with gastrointestinal hemorrhage. Critical Value/emergent results were called by telephone at the time of interpretation on 09/28/2022 at 5:49 pm to provider [facility] , who verbally acknowledged these results. Severe stenosis is again noted involving the origin of the superior mesenteric artery. Atherosclerosis of abdominal aorta is noted with large penetrating atherosclerotic ulcer seen in infrarenal portion. NON-VASCULAR Diverticulosis of descending and sigmoid colon is noted. Aortic Atherosclerosis (ICD10-I70.0). Electronically Signed   By: Marijo Conception M.D.   On: 09/28/2022 17:49    Labs:  CBC: Recent Labs    04/21/22 1006 09/27/22 0142 09/27/22 0400 09/27/22 0949 09/27/22 2052 09/28/22 0406 09/28/22 1210 09/28/22 1821  WBC 6.6 11.7* 10.7*  --   --  9.7  --   --   HGB 13.2 16.0 14.3   < > 13.9 13.6 14.4 13.2  HCT 38.6* 49.0 41.8   < > 40.1 41.3 41.9 38.0*  PLT 263 347 263  --   --  276  --   --    < > = values in this interval not displayed.    COAGS: Recent Labs    09/27/22 0142 09/27/22 2052  INR 1.0  --   APTT  --  31    BMP: Recent Labs    04/20/22 0301 04/21/22 1006 09/27/22 0142 09/27/22 0400  NA 136 139 135 135  K 3.7 4.1 4.3 4.5  CL 103 103 103 105  CO2 25 29 23 24  $ GLUCOSE 89 98 124* 117*  BUN 13 8 14 15  $ CALCIUM 8.6* 8.9 8.8* 8.0*  CREATININE 0.91 1.01 1.14 1.01  GFRNONAA >60 >60 >60 >60    LIVER FUNCTION TESTS: Recent Labs    12/01/21 1249 04/19/22 0720 09/27/22 0142 09/27/22 0400  BILITOT 0.5 0.6 0.6 0.4  AST 49* 37 45* 41  ALT 53* 37 38 36  ALKPHOS 102 75 86 70  PROT 7.5 6.9 6.5 5.6*  ALBUMIN 4.8 3.9 3.6 3.1*    TUMOR MARKERS: No results for input(s): "AFPTM", "CEA", "CA199", "CHROMGRNA" in the last 8760 hours.  Assessment and Plan:  62 year old gentleman with acute lower GI bleed presents to IR for mesenteric angiogram and possible embolization  CTA  shows acute hemorrhage in the sigmoid colon.  Risks and benefits of mesenteric angiogram and embolization were discussed with the patient including, but not limited to bleeding, infection, vascular injury, bowel necrosis requiring surgical excision or contrast induced renal failure.  All of the patient's questions were answered, patient is agreeable to proceed.  Consent signed and in chart.  Thank you for this interesting consult.  I greatly enjoyed meeting Landon Noboa and look forward to participating in their care.  A copy  of this report was sent to the requesting provider on this date.  Electronically Signed: Paula Libra Matricia Begnaud, MD 09/28/2022, 7:09 PM   I spent a total of 40 Minutes  in face to face in clinical consultation, greater than 50% of which was counseling/coordinating care for acute lower GI bleed.

## 2022-09-29 DIAGNOSIS — I700 Atherosclerosis of aorta: Secondary | ICD-10-CM | POA: Diagnosis not present

## 2022-09-29 DIAGNOSIS — K922 Gastrointestinal hemorrhage, unspecified: Secondary | ICD-10-CM | POA: Diagnosis not present

## 2022-09-29 DIAGNOSIS — K551 Chronic vascular disorders of intestine: Secondary | ICD-10-CM | POA: Insufficient documentation

## 2022-09-29 DIAGNOSIS — E782 Mixed hyperlipidemia: Secondary | ICD-10-CM | POA: Diagnosis not present

## 2022-09-29 DIAGNOSIS — D62 Acute posthemorrhagic anemia: Secondary | ICD-10-CM

## 2022-09-29 LAB — HEMOGLOBIN AND HEMATOCRIT, BLOOD
HCT: 34.7 % — ABNORMAL LOW (ref 39.0–52.0)
HCT: 35.2 % — ABNORMAL LOW (ref 39.0–52.0)
HCT: 36.4 % — ABNORMAL LOW (ref 39.0–52.0)
HCT: 38.7 % — ABNORMAL LOW (ref 39.0–52.0)
Hemoglobin: 11.7 g/dL — ABNORMAL LOW (ref 13.0–17.0)
Hemoglobin: 12.1 g/dL — ABNORMAL LOW (ref 13.0–17.0)
Hemoglobin: 12.1 g/dL — ABNORMAL LOW (ref 13.0–17.0)
Hemoglobin: 12.9 g/dL — ABNORMAL LOW (ref 13.0–17.0)

## 2022-09-29 NOTE — Progress Notes (Signed)
PROGRESS NOTE  Kevin Wallace [id number] DOB: [birth year]   PCP: Ginger Organ., MD  Patient is from: Home.  Lives with his wife.  DOA: 09/27/2022 LOS: 0  Chief complaints Chief Complaint  Patient presents with   Rectal Bleeding   Loss of Consciousness     Brief Narrative / Interim history: 62 year old M with history of CAD s/p DES to LAD in 08/2021, diverticulosis, previous GI bleed thought to be diverticular and hyperlipidemia presenting with 7 episodes of bright red blood per rectum for about 6 hours prior to arrival to ED, and admitted for rectal bleed.  Patient is on Plavix.  No abdominal pain.  Hgb 16.  Hemodynamically stable.  GI consulted and following.  Patient has recurrence of hematochezia on 2/13.  CT angio showed extravasation in proximal sigmoid colon.  IR for embolization but no active hemorrhage during angiogram.  CT angio also showed severe superior mesenteric artery stenosis and aortic atherosclerosis.  Hgb trended down to 12.1.  GI following.   Subjective: Seen and examined earlier this morning.  No major events overnight.  Has not had further bowel movements since yesterday evening when I saw him this morning.  He reported few small dark blood clots later in the morning.  No chest pain or dyspnea.  Reports lightheadedness.   Objective: Vitals:   09/28/22 2015 09/28/22 2030 09/29/22 0517 09/29/22 0854  BP: 102/69 111/82 128/76 131/82  Pulse: 71 77 71 80  Resp: 12 11 15 17  $ Temp:   (!) 97.5 F (36.4 C) 98.3 F (36.8 C)  TempSrc:   Oral Oral  SpO2: 100% 96% 96% 94%  Weight:      Height:        Examination:  GENERAL: No apparent distress.  Nontoxic. HEENT: MMM.  Vision and hearing grossly intact.  NECK: Supple.  No apparent JVD.  RESP:  No IWOB.  Fair aeration bilaterally. CVS:  RRR. Heart sounds normal.  ABD/GI/GU: BS+. Abd soft, NTND.  MSK/EXT:   No apparent deformity. Moves extremities. No edema.  SKIN: no apparent skin lesion or  wound NEURO: Awake and alert. Oriented appropriately.  No apparent focal neuro deficit. PSYCH: Calm. Normal affect.   Procedures:  2/13-inferior mesenteric artery angiogram without bleeding.  Microbiology summarized: None  Assessment and plan: Principal Problem:   Acute lower GI bleeding Active Problems:   HLD (hyperlipidemia)   Diverticulosis of colon with hemorrhage   Hematochezia   Leukocytosis   Superior mesenteric artery stenosis (HCC)   Aortic atherosclerosis (HCC)   Acute blood loss anemia  Acute blood loss anemia due to acute lower GI bleeding:  likely diverticular.  Has history of severe diverticulosis.  CT angio showed extravasation to proximal sigmoid colon but no hemorrhage during inferior mesenteric artery angiogram. Recent Labs    09/27/22 0400 09/27/22 0949 09/27/22 1353 09/27/22 2052 09/28/22 0406 09/28/22 1210 09/28/22 1821 09/29/22 0019 09/29/22 0603 09/29/22 1144  HGB 14.3 13.9 13.8 13.9 13.6 14.4 13.2 12.9* 12.1* 12.1*  -GI following-appreciate recommendation -Monitor H&H every 6 hours.  Transfuse for Hgb less than 8 given CAD. -Typed and screened. -Continue holding Plavix. -Diet advanced to full liquid today.  History of CAD s/p LAD in 08/2021.  On Plavix at home. -Continue holding Plavix -Continue home Crestor  Severe superior mesenteric artery stenosis: Patient denies postprandial abdominal pain.   -Outpatient follow-up with vascular surgery  Hyperlipidemia/aortic atherosclerosis -Continue home Crestor  History of gout -Continue home allopurinol  Leukocytosis: Likely demargination.  Resolved.  Body mass index is 27.2 kg/m.          DVT prophylaxis:  SCDs Start: 09/27/22 [id number]  Code Status: Full code Family Communication: Updated patient's wife and daughter at bedside. Level of care: Med-Surg Status is: Inpatient The patient will require care spanning > 2 midnights and should be moved to inpatient because: Due to acute blood  loss anemia due to lower GI bleed   Final disposition: Home once medically cleared Consultants:  GI IR  35 minutes with more than 50% spent in reviewing records, counseling patient/family and coordinating care.   Sch Meds:  Scheduled Meds:  allopurinol  300 mg Oral Daily   rosuvastatin  40 mg Oral QHS   Continuous Infusions:   PRN Meds:.acetaminophen **OR** acetaminophen, oxyCODONE  Antimicrobials: Anti-infectives (From admission, onward)    None        I have personally reviewed the following labs and images: CBC: Recent Labs  Lab 09/27/22 0142 09/27/22 0400 09/27/22 0949 09/28/22 0406 09/28/22 1210 09/28/22 1821 09/29/22 0019 09/29/22 0603 09/29/22 1144  WBC 11.7* 10.7*  --  9.7  --   --   --   --   --   NEUTROABS  --  8.4*  --   --   --   --   --   --   --   HGB 16.0 14.3   < > 13.6 14.4 13.2 12.9* 12.1* 12.1*  HCT 49.0 41.8   < > 41.3 41.9 38.0* 38.7* 36.4* 35.2*  MCV 92.1 90.7  --  91.6  --   --   --   --   --   PLT 347 263  --  276  --   --   --   --   --    < > = values in this interval not displayed.   BMP &GFR Recent Labs  Lab 09/27/22 0142 09/27/22 0400  NA 135 135  K 4.3 4.5  CL 103 105  CO2 23 24  GLUCOSE 124* 117*  BUN 14 15  CREATININE 1.14 1.01  CALCIUM 8.8* 8.0*  MG  --  2.0   Estimated Creatinine Clearance: 81.8 mL/min (by C-G formula based on SCr of 1.01 mg/dL). Liver & Pancreas: Recent Labs  Lab 09/27/22 0142 09/27/22 0400  AST 45* 41  ALT 38 36  ALKPHOS 86 70  BILITOT 0.6 0.4  PROT 6.5 5.6*  ALBUMIN 3.6 3.1*   No results for input(s): "LIPASE", "AMYLASE" in the last 168 hours. No results for input(s): "AMMONIA" in the last 168 hours. Diabetic: No results for input(s): "HGBA1C" in the last 72 hours. Recent Labs  Lab 09/27/22 0140  GLUCAP 122*   Cardiac Enzymes: No results for input(s): "CKTOTAL", "CKMB", "CKMBINDEX", "TROPONINI" in the last 168 hours. No results for input(s): "PROBNP" in the last 8760  hours. Coagulation Profile: Recent Labs  Lab 09/27/22 0142  INR 1.0   Thyroid Function Tests: No results for input(s): "TSH", "T4TOTAL", "FREET4", "T3FREE", "THYROIDAB" in the last 72 hours. Lipid Profile: No results for input(s): "CHOL", "HDL", "LDLCALC", "TRIG", "CHOLHDL", "LDLDIRECT" in the last 72 hours. Anemia Panel: No results for input(s): "VITAMINB12", "FOLATE", "FERRITIN", "TIBC", "IRON", "RETICCTPCT" in the last 72 hours. Urine analysis: No results found for: "COLORURINE", "APPEARANCEUR", "LABSPEC", "PHURINE", "GLUCOSEU", "HGBUR", "BILIRUBINUR", "KETONESUR", "PROTEINUR", "UROBILINOGEN", "NITRITE", "LEUKOCYTESUR" Sepsis Labs: Invalid input(s): "PROCALCITONIN", "LACTICIDVEN"  Microbiology: No results found for this or any previous visit (from the past 240 hour(s)).  Radiology Studies: IR Angiogram Visceral Selective  Result Date: 09/29/2022 INDICATION: 62 year old gentleman with acute sigmoid colon hemorrhage presents to IR for angiogram and possible embolization. EXAM: 1. Ultrasound-guided access of right common femoral artery 2. Inferior mesenteric angiogram 3. Sub selective angiogram of inferior mesenteric artery branches (x4) MEDICATIONS: None ANESTHESIA/SEDATION: Moderate (conscious) sedation was employed during this procedure. A total of Versed 1 mg and Fentanyl 50 mcg was administered intravenously by the radiology nurse. Total intra-service moderate Sedation Time: 34 minutes. The patient's level of consciousness and vital signs were monitored continuously by radiology nursing throughout the procedure under my direct supervision. CONTRAST:  25 mL of Omnipaque 300 FLUOROSCOPY: Radiation Exposure Index (as provided by the fluoroscopic device): 123456 mGy Kerma COMPLICATIONS: None immediate. PROCEDURE: Informed consent was obtained from the patient following explanation of the procedure, risks, benefits and alternatives. The patient understands, agrees and consents for the procedure.  All questions were addressed. A time out was performed prior to the initiation of the procedure. Maximal barrier sterile technique utilized including caps, mask, sterile gowns, sterile gloves, large sterile drape, hand hygiene, and chlorhexidine prep. Ultrasound image documenting patency of the right common femoral artery was obtained and placed in permanent medical record. Sterile ultrasound probe cover and gel utilized throughout the procedure. Utilizing continuous ultrasound guidance, the right common femoral artery was accessed at the level of the femoral head with a 21 gauge needle. 21 gauge needle exchanged for a transitional dilator set over 0.018 inch guidewire. Transitional dilator set exchanged for 5 French sheath over 0.035 inch guidewire. Inferior mesenteric artery was selected with a rim catheter. Inferior mesenteric angiogram showed patent IMA branches. No active extravasation was identified. Four separate branches of the inferior mesenteric artery supplying the sigmoid colon were individually selected with Progreat microcatheter and angiogram was performed. No active hemorrhage was identified Microcatheter and base catheter were removed over 0.035 inch Amplatz guidewire. Sheath angiogram confirmed appropriate puncture at the level of the femoral head. The groin was re-prepped and draped. Angioseal sheath was inserted, however the Spray from the introducer indicating intra-arterial positioning was minimal. Decision was made to not place the Angio-Seal device. The Angio-Seal sheath and guidewire were removed and hemostasis achieved with 15 minutes of manual compression. IMPRESSION: Inferior mesenteric angiogram demonstrates no active extravasation within the sigmoid colon. No embolization was performed. Electronically Signed   By: Miachel Roux M.D.   On: 09/29/2022 08:22   IR US Guide Vasc Access Right  Result Date: 09/29/2022 INDICATION: 62 year old gentleman with acute sigmoid colon hemorrhage  presents to IR for angiogram and possible embolization. EXAM: 1. Ultrasound-guided access of right common femoral artery 2. Inferior mesenteric angiogram 3. Sub selective angiogram of inferior mesenteric artery branches (x4) MEDICATIONS: None ANESTHESIA/SEDATION: Moderate (conscious) sedation was employed during this procedure. A total of Versed 1 mg and Fentanyl 50 mcg was administered intravenously by the radiology nurse. Total intra-service moderate Sedation Time: 34 minutes. The patient's level of consciousness and vital signs were monitored continuously by radiology nursing throughout the procedure under my direct supervision. CONTRAST:  25 mL of Omnipaque 300 FLUOROSCOPY: Radiation Exposure Index (as provided by the fluoroscopic device): 123456 mGy Kerma COMPLICATIONS: None immediate. PROCEDURE: Informed consent was obtained from the patient following explanation of the procedure, risks, benefits and alternatives. The patient understands, agrees and consents for the procedure. All questions were addressed. A time out was performed prior to the initiation of the procedure. Maximal barrier sterile technique utilized including caps, mask, sterile gowns, sterile gloves, large sterile drape, hand hygiene, and chlorhexidine prep.  Ultrasound image documenting patency of the right common femoral artery was obtained and placed in permanent medical record. Sterile ultrasound probe cover and gel utilized throughout the procedure. Utilizing continuous ultrasound guidance, the right common femoral artery was accessed at the level of the femoral head with a 21 gauge needle. 21 gauge needle exchanged for a transitional dilator set over 0.018 inch guidewire. Transitional dilator set exchanged for 5 French sheath over 0.035 inch guidewire. Inferior mesenteric artery was selected with a rim catheter. Inferior mesenteric angiogram showed patent IMA branches. No active extravasation was identified. Four separate branches of the  inferior mesenteric artery supplying the sigmoid colon were individually selected with Progreat microcatheter and angiogram was performed. No active hemorrhage was identified Microcatheter and base catheter were removed over 0.035 inch Amplatz guidewire. Sheath angiogram confirmed appropriate puncture at the level of the femoral head. The groin was re-prepped and draped. Angioseal sheath was inserted, however the Spray from the introducer indicating intra-arterial positioning was minimal. Decision was made to not place the Angio-Seal device. The Angio-Seal sheath and guidewire were removed and hemostasis achieved with 15 minutes of manual compression. IMPRESSION: Inferior mesenteric angiogram demonstrates no active extravasation within the sigmoid colon. No embolization was performed. Electronically Signed   By: Miachel Roux M.D.   On: 09/29/2022 08:22   CT ANGIO GI BLEED  Result Date: 09/28/2022 CLINICAL DATA:  Lower gastrointestinal bleeding. EXAM: CTA ABDOMEN AND PELVIS WITHOUT AND WITH CONTRAST TECHNIQUE: Multidetector CT imaging of the abdomen and pelvis was performed using the standard protocol during bolus administration of intravenous contrast. Multiplanar reconstructed images and MIPs were obtained and reviewed to evaluate the vascular anatomy. RADIATION DOSE REDUCTION: This exam was performed according to the departmental dose-optimization program which includes automated exposure control, adjustment of the mA and/or kV according to patient size and/or use of iterative reconstruction technique. CONTRAST:  23m OMNIPAQUE IOHEXOL 350 MG/ML SOLN COMPARISON:  April 19, 2022. FINDINGS: VASCULAR Aorta: Atherosclerosis of abdominal aorta is noted with probable large atherosclerotic ulcer. No definite aneurysm or dissection is noted. Celiac: Patent without evidence of aneurysm, dissection, vasculitis or significant stenosis. SMA: Severe stenosis is noted in the proximal portion without thrombus. Renals:  Both renal arteries are patent without evidence of aneurysm, dissection, vasculitis, fibromuscular dysplasia or significant stenosis. IMA: Patent without evidence of aneurysm, dissection, vasculitis or significant stenosis. Inflow: Patent without evidence of aneurysm, dissection, vasculitis or significant stenosis. Proximal Outflow: Bilateral common femoral and visualized portions of the superficial and profunda femoral arteries are patent without evidence of aneurysm, dissection, vasculitis or significant stenosis. Veins: No obvious venous abnormality within the limitations of this arterial phase study. Review of the MIP images confirms the above findings. NON-VASCULAR Lower chest: No acute abnormality. Hepatobiliary: No focal liver abnormality is seen. No gallstones, gallbladder wall thickening, or biliary dilatation. Pancreas: Unremarkable. No pancreatic ductal dilatation or surrounding inflammatory changes. Spleen: Normal in size without focal abnormality. Adrenals/Urinary Tract: Adrenal glands are unremarkable. Kidneys are normal, without renal calculi, focal lesion, or hydronephrosis. Bladder is unremarkable. Stomach/Bowel: The stomach and appendix are unremarkable. There is no evidence of bowel obstruction or inflammation. Diverticulosis of sigmoid and descending colon is noted. Active contrast extravasation is seen in the proximal sigmoid colon consistent with gastrointestinal hemorrhage. Lymphatic: No adenopathy is noted. Reproductive: Status post prostatectomy. Other: No abdominal wall hernia or abnormality. No abdominopelvic ascites. Musculoskeletal: No acute or significant osseous findings. IMPRESSION: VASCULAR There is active contrast extravasation seen in the proximal sigmoid colon consistent with gastrointestinal hemorrhage.  Critical Value/emergent results were called by telephone at the time of interpretation on 09/28/2022 at 5:49 pm to provider [facility] , who verbally acknowledged these results.  Severe stenosis is again noted involving the origin of the superior mesenteric artery. Atherosclerosis of abdominal aorta is noted with large penetrating atherosclerotic ulcer seen in infrarenal portion. NON-VASCULAR Diverticulosis of descending and sigmoid colon is noted. Aortic Atherosclerosis (ICD10-I70.0). Electronically Signed   By: Marijo Conception M.D.   On: 09/28/2022 17:49      Talia Hoheisel T. Morrill  If 7PM-7AM, please contact night-coverage [email or web address] 09/29/2022, 3:34 PM

## 2022-09-29 NOTE — Plan of Care (Signed)

## 2022-09-29 NOTE — Progress Notes (Addendum)
[facility] Progress Note  CC: GI bleed  Subjective: He passed a few small dark blood clots this morning. No BM or hematochezia after eating a full liquid diet for lunch. No N/V or abdominal pain. No CP or SOB.   Objective:  Vital signs in last 24 hours: Temp:  [97.5 F (36.4 C)-99 F (37.2 C)] 98.3 F (36.8 C) (02/14 0854) Pulse Rate:  [71-93] 80 (02/14 0854) Resp:  [11-25] 17 (02/14 0854) BP: (102-151)/(69-95) 131/82 (02/14 0854) SpO2:  [94 %-100 %] 94 % (02/14 0854) Last BM Date : 09/28/22 General: Alert, well-developed in NAD Heart: RRR, no murmur. Pulm: Breath sounds clear throughout.  Abdomen: Soft, nontender. Nondistended. Positive bowel sounds x 4 quads.  Extremities:  No LE edema. Neurologic:  Alert and  oriented x 4. Grossly normal neurologically. Psych:  Alert and cooperative. Normal mood and affect.  Intake/Output from previous day: 02/13 0701 - 02/14 0700 In: 240 [P.O.:240] Out: -  Intake/Output this shift: Total I/O In: 360 [P.O.:360] Out: -   Lab Results: Recent Labs    09/27/22 0142 09/27/22 0400 09/27/22 0949 09/28/22 0406 09/28/22 1210 09/29/22 0019 09/29/22 0603 09/29/22 1144  WBC 11.7* 10.7*  --  9.7  --   --   --   --   HGB 16.0 14.3   < > 13.6   < > 12.9* 12.1* 12.1*  HCT 49.0 41.8   < > 41.3   < > 38.7* 36.4* 35.2*  PLT 347 263  --  276  --   --   --   --    < > = values in this interval not displayed.   BMET Recent Labs    09/27/22 0142 09/27/22 0400  NA 135 135  K 4.3 4.5  CL 103 105  CO2 23 24  GLUCOSE 124* 117*  BUN 14 15  CREATININE 1.14 1.01  CALCIUM 8.8* 8.0*   LFT Recent Labs    09/27/22 0400  PROT 5.6*  ALBUMIN 3.1*  AST 41  ALT 36  ALKPHOS 70  BILITOT 0.4   PT/INR Recent Labs    09/27/22 0142  LABPROT 13.3  INR 1.0   Hepatitis Panel No results for input(s): "HEPBSAG", "HCVAB", "HEPAIGM", "HEPBIGM" in the last 72 hours.  IR Angiogram Visceral Selective  Result Date:  09/29/2022 INDICATION: 62 year old gentleman with acute sigmoid colon hemorrhage presents to IR for angiogram and possible embolization. EXAM: 1. Ultrasound-guided access of right common femoral artery 2. Inferior mesenteric angiogram 3. Sub selective angiogram of inferior mesenteric artery branches (x4) MEDICATIONS: None ANESTHESIA/SEDATION: Moderate (conscious) sedation was employed during this procedure. A total of Versed 1 mg and Fentanyl 50 mcg was administered intravenously by the radiology nurse. Total intra-service moderate Sedation Time: 34 minutes. The patient's level of consciousness and vital signs were monitored continuously by radiology nursing throughout the procedure under my direct supervision. CONTRAST:  25 mL of Omnipaque 300 FLUOROSCOPY: Radiation Exposure Index (as provided by the fluoroscopic device): 123456 mGy Kerma COMPLICATIONS: None immediate. PROCEDURE: Informed consent was obtained from the patient following explanation of the procedure, risks, benefits and alternatives. The patient understands, agrees and consents for the procedure. All questions were addressed. A time out was performed prior to the initiation of the procedure. Maximal barrier sterile technique utilized including caps, mask, sterile gowns, sterile gloves, large sterile drape, hand hygiene, and chlorhexidine prep. Ultrasound image documenting patency of the right common femoral artery was obtained and placed in permanent medical record. Sterile  ultrasound probe cover and gel utilized throughout the procedure. Utilizing continuous ultrasound guidance, the right common femoral artery was accessed at the level of the femoral head with a 21 gauge needle. 21 gauge needle exchanged for a transitional dilator set over 0.018 inch guidewire. Transitional dilator set exchanged for 5 French sheath over 0.035 inch guidewire. Inferior mesenteric artery was selected with a rim catheter. Inferior mesenteric angiogram showed patent IMA  branches. No active extravasation was identified. Four separate branches of the inferior mesenteric artery supplying the sigmoid colon were individually selected with Progreat microcatheter and angiogram was performed. No active hemorrhage was identified Microcatheter and base catheter were removed over 0.035 inch Amplatz guidewire. Sheath angiogram confirmed appropriate puncture at the level of the femoral head. The groin was re-prepped and draped. Angioseal sheath was inserted, however the Spray from the introducer indicating intra-arterial positioning was minimal. Decision was made to not place the Angio-Seal device. The Angio-Seal sheath and guidewire were removed and hemostasis achieved with 15 minutes of manual compression. IMPRESSION: Inferior mesenteric angiogram demonstrates no active extravasation within the sigmoid colon. No embolization was performed. Electronically Signed   By: Miachel Roux M.D.   On: 09/29/2022 08:22   IR US Guide Vasc Access Right  Result Date: 09/29/2022 INDICATION: 62 year old gentleman with acute sigmoid colon hemorrhage presents to IR for angiogram and possible embolization. EXAM: 1. Ultrasound-guided access of right common femoral artery 2. Inferior mesenteric angiogram 3. Sub selective angiogram of inferior mesenteric artery branches (x4) MEDICATIONS: None ANESTHESIA/SEDATION: Moderate (conscious) sedation was employed during this procedure. A total of Versed 1 mg and Fentanyl 50 mcg was administered intravenously by the radiology nurse. Total intra-service moderate Sedation Time: 34 minutes. The patient's level of consciousness and vital signs were monitored continuously by radiology nursing throughout the procedure under my direct supervision. CONTRAST:  25 mL of Omnipaque 300 FLUOROSCOPY: Radiation Exposure Index (as provided by the fluoroscopic device): 123456 mGy Kerma COMPLICATIONS: None immediate. PROCEDURE: Informed consent was obtained from the patient following  explanation of the procedure, risks, benefits and alternatives. The patient understands, agrees and consents for the procedure. All questions were addressed. A time out was performed prior to the initiation of the procedure. Maximal barrier sterile technique utilized including caps, mask, sterile gowns, sterile gloves, large sterile drape, hand hygiene, and chlorhexidine prep. Ultrasound image documenting patency of the right common femoral artery was obtained and placed in permanent medical record. Sterile ultrasound probe cover and gel utilized throughout the procedure. Utilizing continuous ultrasound guidance, the right common femoral artery was accessed at the level of the femoral head with a 21 gauge needle. 21 gauge needle exchanged for a transitional dilator set over 0.018 inch guidewire. Transitional dilator set exchanged for 5 French sheath over 0.035 inch guidewire. Inferior mesenteric artery was selected with a rim catheter. Inferior mesenteric angiogram showed patent IMA branches. No active extravasation was identified. Four separate branches of the inferior mesenteric artery supplying the sigmoid colon were individually selected with Progreat microcatheter and angiogram was performed. No active hemorrhage was identified Microcatheter and base catheter were removed over 0.035 inch Amplatz guidewire. Sheath angiogram confirmed appropriate puncture at the level of the femoral head. The groin was re-prepped and draped. Angioseal sheath was inserted, however the Spray from the introducer indicating intra-arterial positioning was minimal. Decision was made to not place the Angio-Seal device. The Angio-Seal sheath and guidewire were removed and hemostasis achieved with 15 minutes of manual compression. IMPRESSION: Inferior mesenteric angiogram demonstrates no active extravasation within  the sigmoid colon. No embolization was performed. Electronically Signed   By: Miachel Roux M.D.   On: 09/29/2022 08:22   CT  ANGIO GI BLEED  Result Date: 09/28/2022 CLINICAL DATA:  Lower gastrointestinal bleeding. EXAM: CTA ABDOMEN AND PELVIS WITHOUT AND WITH CONTRAST TECHNIQUE: Multidetector CT imaging of the abdomen and pelvis was performed using the standard protocol during bolus administration of intravenous contrast. Multiplanar reconstructed images and MIPs were obtained and reviewed to evaluate the vascular anatomy. RADIATION DOSE REDUCTION: This exam was performed according to the departmental dose-optimization program which includes automated exposure control, adjustment of the mA and/or kV according to patient size and/or use of iterative reconstruction technique. CONTRAST:  27m OMNIPAQUE IOHEXOL 350 MG/ML SOLN COMPARISON:  April 19, 2022. FINDINGS: VASCULAR Aorta: Atherosclerosis of abdominal aorta is noted with probable large atherosclerotic ulcer. No definite aneurysm or dissection is noted. Celiac: Patent without evidence of aneurysm, dissection, vasculitis or significant stenosis. SMA: Severe stenosis is noted in the proximal portion without thrombus. Renals: Both renal arteries are patent without evidence of aneurysm, dissection, vasculitis, fibromuscular dysplasia or significant stenosis. IMA: Patent without evidence of aneurysm, dissection, vasculitis or significant stenosis. Inflow: Patent without evidence of aneurysm, dissection, vasculitis or significant stenosis. Proximal Outflow: Bilateral common femoral and visualized portions of the superficial and profunda femoral arteries are patent without evidence of aneurysm, dissection, vasculitis or significant stenosis. Veins: No obvious venous abnormality within the limitations of this arterial phase study. Review of the MIP images confirms the above findings. NON-VASCULAR Lower chest: No acute abnormality. Hepatobiliary: No focal liver abnormality is seen. No gallstones, gallbladder wall thickening, or biliary dilatation. Pancreas: Unremarkable. No pancreatic  ductal dilatation or surrounding inflammatory changes. Spleen: Normal in size without focal abnormality. Adrenals/Urinary Tract: Adrenal glands are unremarkable. Kidneys are normal, without renal calculi, focal lesion, or hydronephrosis. Bladder is unremarkable. Stomach/Bowel: The stomach and appendix are unremarkable. There is no evidence of bowel obstruction or inflammation. Diverticulosis of sigmoid and descending colon is noted. Active contrast extravasation is seen in the proximal sigmoid colon consistent with gastrointestinal hemorrhage. Lymphatic: No adenopathy is noted. Reproductive: Status post prostatectomy. Other: No abdominal wall hernia or abnormality. No abdominopelvic ascites. Musculoskeletal: No acute or significant osseous findings. IMPRESSION: VASCULAR There is active contrast extravasation seen in the proximal sigmoid colon consistent with gastrointestinal hemorrhage. Critical Value/emergent results were called by telephone at the time of interpretation on 09/28/2022 at 5:49 pm [facility] verb[facility] these results. Severe stenosis is again noted involving the origin of the superior mesenteric artery. Atherosclerosis of abdominal aorta is noted with large penetrating atherosclerotic ulcer seen in infrarenal portion. NON-VASCULAR Diverticulosis of descending and sigmoid colon is noted. Aortic Atherosclerosis (ICD10-I70.0). Electronically Signed   By: JMarijo ConceptionM.D.   On: 09/28/2022 17:49    Assessment / Plan:  62year old male with CAD s/p stent on Plavix, with history of diverticular bleed in September 2023 (sigmoid colon, confirmed with CT-A) which resolved without intervention, who presented again with profuse painless hematochezia.  After going nearly 16 hours without a bowel movement, he again experienced profuse bleeding yesterday afternoon. A CT-A was completed which showed active hemorrhage in the proximal sigmoid colon. He underwent an inferior  mesenteric angiogram by IR without evidence of active bleeding therefore coil embolization was not performed.Hg 13.6 -> 14.4 -> 13.2 -> today Hg 12.1 -> 12.1. He passed a few small blood clots this morning. He tolerated a full liquid diet for lunch today.  -  Continue to monitor patient closely for GI bleeding -Consider colonoscopy if he demonstrates any further hematochezia  -CBC in am -Transfuse for Hg < 8 -Continue full liquid diet for now, if no bleeding overnight advance to soft diet tomorrow morning  -Await further recommendations per Dr. Candis Schatz   Coronary artery disease s/p NSTEMI and  DES 08/31/2021. Brilinta was previously switched to Plavix after he had a diverticular bleed 01/2022. Plavix remains on hold since admission.  -Defer Plavix restart recommendations to Dr. Candis Schatz and cardiology. Patient is one year our from his MI/DES placement.    Principal Problem:   Acute lower GI bleeding Active Problems:   HLD (hyperlipidemia)   Diverticulosis of colon with hemorrhage   Hematochezia   Leukocytosis   LOS: 0 days   Noralyn Pick  09/29/2022, 1:55 PM  I have taken a history, reviewed the chart and examined the patient. I performed a substantive portion of this encounter, including complete performance of at least one of the key components, in conjunction with the APP. I agree with the APP's note, impression and recommendations  Despite frequent and profuse hematochezia yesterday with positive Ct-A, no bleeding detected at time of catheterization, so no embolization performed.  He has not any overt bleeding since yesterday early evening.   If he does have recurrent bleeding overnight or tomorrow, will tentatively plan for colonoscopy or flexible sigmoidoscopy tomorrow. Continue to hold Plavix FLD today, advance tomorrow if no bleeding overnight.   Ajit Errico E. Candis Schatz, MD [facility]

## 2022-09-30 DIAGNOSIS — K922 Gastrointestinal hemorrhage, unspecified: Secondary | ICD-10-CM | POA: Diagnosis not present

## 2022-09-30 DIAGNOSIS — K551 Chronic vascular disorders of intestine: Secondary | ICD-10-CM | POA: Diagnosis not present

## 2022-09-30 DIAGNOSIS — I700 Atherosclerosis of aorta: Secondary | ICD-10-CM | POA: Diagnosis not present

## 2022-09-30 DIAGNOSIS — K5731 Diverticulosis of large intestine without perforation or abscess with bleeding: Secondary | ICD-10-CM | POA: Diagnosis not present

## 2022-09-30 DIAGNOSIS — E871 Hypo-osmolality and hyponatremia: Secondary | ICD-10-CM

## 2022-09-30 DIAGNOSIS — E782 Mixed hyperlipidemia: Secondary | ICD-10-CM | POA: Diagnosis not present

## 2022-09-30 LAB — RENAL FUNCTION PANEL
Albumin: 3.1 g/dL — ABNORMAL LOW (ref 3.5–5.0)
Anion gap: 7 (ref 5–15)
BUN: 15 mg/dL (ref 8–23)
CO2: 27 mmol/L (ref 22–32)
Calcium: 8.3 mg/dL — ABNORMAL LOW (ref 8.9–10.3)
Chloride: 98 mmol/L (ref 98–111)
Creatinine, Ser: 0.98 mg/dL (ref 0.61–1.24)
GFR, Estimated: >60 mL/min (ref >60)
Glucose, Bld: 105 mg/dL — ABNORMAL HIGH (ref 70–99)
Phosphorus: 2.8 mg/dL (ref 2.5–4.6)
Potassium: 3.8 mmol/L (ref 3.5–5.1)
Sodium: 132 mmol/L — ABNORMAL LOW (ref 135–145)

## 2022-09-30 LAB — CBC
HCT: 33.8 % — ABNORMAL LOW (ref 39.0–52.0)
Hemoglobin: 11.3 g/dL — ABNORMAL LOW (ref 13.0–17.0)
MCH: 30.1 pg (ref 26.0–34.0)
MCHC: 33.4 g/dL (ref 30.0–36.0)
MCV: 89.9 fL (ref 80.0–100.0)
Platelets: 307 10*3/uL (ref 150–400)
RBC: 3.76 MIL/uL — ABNORMAL LOW (ref 4.22–5.81)
RDW: 13.2 % (ref 11.5–15.5)
WBC: 8.6 10*3/uL (ref 4.0–10.5)
nRBC: 0 % (ref 0.0–0.2)

## 2022-09-30 LAB — HEMOGLOBIN AND HEMATOCRIT, BLOOD
HCT: 33.3 % — ABNORMAL LOW (ref 39.0–52.0)
Hemoglobin: 11.5 g/dL — ABNORMAL LOW (ref 13.0–17.0)

## 2022-09-30 LAB — MAGNESIUM: Magnesium: 2 mg/dL (ref 1.7–2.4)

## 2022-09-30 NOTE — Progress Notes (Signed)
PROGRESS NOTE  Kevin Wallace [id number] DOB: Sep 18, 1960   PCP: Ginger Organ., MD  Patient is from: Home.  Lives with his wife.  DOA: 09/27/2022 LOS: 0  Chief complaints Chief Complaint  Patient presents with   Rectal Bleeding   Loss of Consciousness     Brief Narrative / Interim history: 62 year old M with history of CAD s/p DES to LAD in 08/2021, diverticulosis, previous GI bleed thought to be diverticular and hyperlipidemia presenting with 7 episodes of bright red blood per rectum for about 6 hours prior to arrival to ED, and admitted for rectal bleed.  Patient is on Plavix.  No abdominal pain.  Hgb 16.  Hemodynamically stable.  GI consulted and following.  Patient has recurrence of hematochezia on 2/13.  CT angio showed extravasation in proximal sigmoid colon.  IR for embolization but no active hemorrhage during angiogram.  CT angio also showed severe superior mesenteric artery stenosis and aortic atherosclerosis.  Hgb trended down slowly.  GI following.   Subjective: Seen and examined earlier this morning.  No major events overnight of this morning.  No major bleeding.  Noted small dark blood clots yesterday.  No bowel movement or bleeding overnight.  Feels awake from lying in bed.  Objective: Vitals:   09/29/22 1801 09/29/22 2059 09/30/22 0626 09/30/22 0814  BP: 129/71 127/75 121/73 128/69  Pulse: 69 60 72 64  Resp: 18 18 18 16  $ Temp: 98.9 F (37.2 C) 98.4 F (36.9 C) 98.4 F (36.9 C) 98.2 F (36.8 C)  TempSrc: Oral Oral Oral Oral  SpO2: 96% 94% 94% 93%  Weight:   87 kg   Height:        Examination: GENERAL: No apparent distress.  Nontoxic. HEENT: MMM.  Vision and hearing grossly intact.  NECK: Supple.  No apparent JVD.  RESP:  No IWOB.  Fair aeration bilaterally. CVS:  RRR. Heart sounds normal.  ABD/GI/GU: BS+. Abd soft, NTND.  MSK/EXT:   No apparent deformity. Moves extremities. No edema.  SKIN: no apparent skin lesion or wound NEURO: Awake and  alert. Oriented appropriately.  No apparent focal neuro deficit. PSYCH: Calm. Normal affect.   Procedures:  2/13-inferior mesenteric artery angiogram without bleeding.  Microbiology summarized: None  Assessment and plan: Principal Problem:   Acute lower GI bleeding Active Problems:   HLD (hyperlipidemia)   Diverticulosis of colon with hemorrhage   Hematochezia   Leukocytosis   Superior mesenteric artery stenosis (HCC)   Aortic atherosclerosis (HCC)   Acute blood loss anemia  Acute blood loss anemia due to acute lower GI bleeding:  likely diverticular.  Has history of severe diverticulosis.  CT angio showed extravasation to proximal sigmoid colon but no hemorrhage during inferior mesenteric artery angiogram.  Some drop in Hgb as below. Recent Labs    09/27/22 1353 09/27/22 2052 09/28/22 0406 09/28/22 1210 09/28/22 1821 09/29/22 0019 09/29/22 0603 09/29/22 1144 09/29/22 1819 09/30/22 0407  HGB 13.8 13.9 13.6 14.4 13.2 12.9* 12.1* 12.1* 11.7* 11.3*  -GI following-appreciate recommendation -Monitor H&H daily unless he bleeds actively again.  Transfuse for Hgb less than 8 given CAD. -Typed and screened. -Continue holding Plavix. -Diet advanced to soft after discussion with GI.  History of CAD s/p LAD in 08/2021.  On Plavix at home. -Continue holding Plavix -Continue home Crestor  Severe superior mesenteric artery stenosis: Patient denies postprandial abdominal pain.   -Outpatient follow-up with vascular surgery  Hyperlipidemia/aortic atherosclerosis -Continue home Crestor  History of gout -Continue home allopurinol  Mild hyponatremia: -Recheck  Leukocytosis: Likely demargination.  Resolved.  Generalized weakness: Due to acute illness and lying in bed. -Advised to get out of the bed and ambulate as tolerated.  He is steady on his feet.  Body mass index is 26.75 kg/m.          DVT prophylaxis:  SCDs Start: 09/27/22 [id number]  Code Status: Full code Family  Communication: Updated patient's wife and daughter at bedside. Level of care: Med-Surg Status is: Inpatient The patient will require care spanning > 2 midnights and should be moved to inpatient because: Due to acute blood loss anemia due to lower GI bleed   Final disposition: Home once medically cleared Consultants:  GI IR  35 minutes with more than 50% spent in reviewing records, counseling patient/family and coordinating care.   Sch Meds:  Scheduled Meds:  allopurinol  300 mg Oral Daily   rosuvastatin  40 mg Oral QHS   Continuous Infusions:   PRN Meds:.acetaminophen **OR** acetaminophen, oxyCODONE  Antimicrobials: Anti-infectives (From admission, onward)    None        I have personally reviewed the following labs and images: CBC: Recent Labs  Lab 09/27/22 0142 09/27/22 0400 09/27/22 0949 09/28/22 0406 09/28/22 1210 09/29/22 0019 09/29/22 0603 09/29/22 1144 09/29/22 1819 09/30/22 0407  WBC 11.7* 10.7*  --  9.7  --   --   --   --   --  8.6  NEUTROABS  --  8.4*  --   --   --   --   --   --   --   --   HGB 16.0 14.3   < > 13.6   < > 12.9* 12.1* 12.1* 11.7* 11.3*  HCT 49.0 41.8   < > 41.3   < > 38.7* 36.4* 35.2* 34.7* 33.8*  MCV 92.1 90.7  --  91.6  --   --   --   --   --  89.9  PLT 347 263  --  276  --   --   --   --   --  307   < > = values in this interval not displayed.   BMP &GFR Recent Labs  Lab 09/27/22 0142 09/27/22 0400 09/30/22 0407  NA 135 135 132*  K 4.3 4.5 3.8  CL 103 105 98  CO2 23 24 27  $ GLUCOSE 124* 117* 105*  BUN 14 15 15  $ CREATININE 1.14 1.01 0.98  CALCIUM 8.8* 8.0* 8.3*  MG  --  2.0 2.0  PHOS  --   --  2.8   Estimated Creatinine Clearance: 84.3 mL/min (by C-G formula based on SCr of 0.98 mg/dL). Liver & Pancreas: Recent Labs  Lab 09/27/22 0142 09/27/22 0400 09/30/22 0407  AST 45* 41  --   ALT 38 36  --   ALKPHOS 86 70  --   BILITOT 0.6 0.4  --   PROT 6.5 5.6*  --   ALBUMIN 3.6 3.1* 3.1*   No results for input(s):  "LIPASE", "AMYLASE" in the last 168 hours. No results for input(s): "AMMONIA" in the last 168 hours. Diabetic: No results for input(s): "HGBA1C" in the last 72 hours. Recent Labs  Lab 09/27/22 0140  GLUCAP 122*   Cardiac Enzymes: No results for input(s): "CKTOTAL", "CKMB", "CKMBINDEX", "TROPONINI" in the last 168 hours. No results for input(s): "PROBNP" in the last 8760 hours. Coagulation Profile: Recent Labs  Lab 09/27/22 0142  INR 1.0   Thyroid Function Tests: No results for  input(s): "TSH", "T4TOTAL", "FREET4", "T3FREE", "THYROIDAB" in the last 72 hours. Lipid Profile: No results for input(s): "CHOL", "HDL", "LDLCALC", "TRIG", "CHOLHDL", "LDLDIRECT" in the last 72 hours. Anemia Panel: No results for input(s): "VITAMINB12", "FOLATE", "FERRITIN", "TIBC", "IRON", "RETICCTPCT" in the last 72 hours. Urine analysis: No results found for: "COLORURINE", "APPEARANCEUR", "LABSPEC", "PHURINE", "GLUCOSEU", "HGBUR", "BILIRUBINUR", "KETONESUR", "PROTEINUR", "UROBILINOGEN", "NITRITE", "LEUKOCYTESUR" Sepsis Labs: Invalid input(s): "PROCALCITONIN", "LACTICIDVEN"  Microbiology: No results found for this or any previous visit (from the past 240 hour(s)).  Radiology Studies: No results found.    Lakishia Bourassa T. Metcalf  If 7PM-7AM, please contact night-coverage [email or web address] 09/30/2022, 4:37 PM

## 2022-09-30 NOTE — Progress Notes (Addendum)
[facility] Progress Note  CC:  GI bleed   Subjective: He tolerated a soft diet for lunch.  No nausea or vomiting.  No abdominal pain.  No bowel movement or blood per the rectum today.  Wife at the bedside.  Objective:  Vital signs in last 24 hours: Temp:  [98.2 F (36.8 C)-98.9 F (37.2 C)] 98.2 F (36.8 C) (02/15 0814) Pulse Rate:  [60-72] 64 (02/15 0814) Resp:  [16-18] 16 (02/15 0814) BP: (121-129)/(69-75) 128/69 (02/15 0814) SpO2:  [93 %-96 %] 93 % (02/15 0814) Weight:  [87 kg] 87 kg (02/15 0626) Last BM Date : 09/29/22 General:  Alert, well-developed in NAD Heart: Regular rate and rhythm, no murmurs. Pulm: Breath sounds clear throughout. Abdomen: Soft, nontender.  Nondistended.  Positive bowel sounds all 4 quadrants. Extremities:  Without edema. Neurologic:  Alert and  oriented x 4. Grossly normal neurologically. Psych:  Alert and cooperative. Normal mood and affect.  Intake/Output from previous day: 02/14 0701 - 02/15 0700 In: 600 [P.O.:600] Out: 0  Intake/Output this shift: No intake/output data recorded.  Lab Results: Recent Labs    09/28/22 0406 09/28/22 1210 09/29/22 1144 09/29/22 1819 09/30/22 0407  WBC 9.7  --   --   --  8.6  HGB 13.6   < > 12.1* 11.7* 11.3*  HCT 41.3   < > 35.2* 34.7* 33.8*  PLT 276  --   --   --  307   < > = values in this interval not displayed.   BMET Recent Labs    09/30/22 0407  NA 132*  K 3.8  CL 98  CO2 27  GLUCOSE 105*  BUN 15  CREATININE 0.98  CALCIUM 8.3*   LFT Recent Labs    09/30/22 0407  ALBUMIN 3.1*    IR Angiogram Visceral Selective  Result Date: 09/29/2022 INDICATION: 62 year old gentleman with acute sigmoid colon hemorrhage presents to IR for angiogram and possible embolization. EXAM: 1. Ultrasound-guided access of right common femoral artery 2. Inferior mesenteric angiogram 3. Sub selective angiogram of inferior mesenteric artery branches (x4) MEDICATIONS: None  ANESTHESIA/SEDATION: Moderate (conscious) sedation was employed during this procedure. A total of Versed 1 mg and Fentanyl 50 mcg was administered intravenously by the radiology nurse. Total intra-service moderate Sedation Time: 34 minutes. The patient's level of consciousness and vital signs were monitored continuously by radiology nursing throughout the procedure under my direct supervision. CONTRAST:  25 mL of Omnipaque 300 FLUOROSCOPY: Radiation Exposure Index (as provided by the fluoroscopic device): 123456 mGy Kerma COMPLICATIONS: None immediate. PROCEDURE: Informed consent was obtained from the patient following explanation of the procedure, risks, benefits and alternatives. The patient understands, agrees and consents for the procedure. All questions were addressed. A time out was performed prior to the initiation of the procedure. Maximal barrier sterile technique utilized including caps, mask, sterile gowns, sterile gloves, large sterile drape, hand hygiene, and chlorhexidine prep. Ultrasound image documenting patency of the right common femoral artery was obtained and placed in permanent medical record. Sterile ultrasound probe cover and gel utilized throughout the procedure. Utilizing continuous ultrasound guidance, the right common femoral artery was accessed at the level of the femoral head with a 21 gauge needle. 21 gauge needle exchanged for a transitional dilator set over 0.018 inch guidewire. Transitional dilator set exchanged for 5 French sheath over 0.035 inch guidewire. Inferior mesenteric artery was selected with a rim catheter. Inferior mesenteric angiogram showed patent IMA branches. No active extravasation was identified. Four separate  branches of the inferior mesenteric artery supplying the sigmoid colon were individually selected with Progreat microcatheter and angiogram was performed. No active hemorrhage was identified Microcatheter and base catheter were removed over 0.035 inch Amplatz  guidewire. Sheath angiogram confirmed appropriate puncture at the level of the femoral head. The groin was re-prepped and draped. Angioseal sheath was inserted, however the Spray from the introducer indicating intra-arterial positioning was minimal. Decision was made to not place the Angio-Seal device. The Angio-Seal sheath and guidewire were removed and hemostasis achieved with 15 minutes of manual compression. IMPRESSION: Inferior mesenteric angiogram demonstrates no active extravasation within the sigmoid colon. No embolization was performed. Electronically Signed   By: Miachel Roux M.D.   On: 09/29/2022 08:22   IR US Guide Vasc Access Right  Result Date: 09/29/2022 INDICATION: 62 year old gentleman with acute sigmoid colon hemorrhage presents to IR for angiogram and possible embolization. EXAM: 1. Ultrasound-guided access of right common femoral artery 2. Inferior mesenteric angiogram 3. Sub selective angiogram of inferior mesenteric artery branches (x4) MEDICATIONS: None ANESTHESIA/SEDATION: Moderate (conscious) sedation was employed during this procedure. A total of Versed 1 mg and Fentanyl 50 mcg was administered intravenously by the radiology nurse. Total intra-service moderate Sedation Time: 34 minutes. The patient's level of consciousness and vital signs were monitored continuously by radiology nursing throughout the procedure under my direct supervision. CONTRAST:  25 mL of Omnipaque 300 FLUOROSCOPY: Radiation Exposure Index (as provided by the fluoroscopic device): 123456 mGy Kerma COMPLICATIONS: None immediate. PROCEDURE: Informed consent was obtained from the patient following explanation of the procedure, risks, benefits and alternatives. The patient understands, agrees and consents for the procedure. All questions were addressed. A time out was performed prior to the initiation of the procedure. Maximal barrier sterile technique utilized including caps, mask, sterile gowns, sterile gloves, large  sterile drape, hand hygiene, and chlorhexidine prep. Ultrasound image documenting patency of the right common femoral artery was obtained and placed in permanent medical record. Sterile ultrasound probe cover and gel utilized throughout the procedure. Utilizing continuous ultrasound guidance, the right common femoral artery was accessed at the level of the femoral head with a 21 gauge needle. 21 gauge needle exchanged for a transitional dilator set over 0.018 inch guidewire. Transitional dilator set exchanged for 5 French sheath over 0.035 inch guidewire. Inferior mesenteric artery was selected with a rim catheter. Inferior mesenteric angiogram showed patent IMA branches. No active extravasation was identified. Four separate branches of the inferior mesenteric artery supplying the sigmoid colon were individually selected with Progreat microcatheter and angiogram was performed. No active hemorrhage was identified Microcatheter and base catheter were removed over 0.035 inch Amplatz guidewire. Sheath angiogram confirmed appropriate puncture at the level of the femoral head. The groin was re-prepped and draped. Angioseal sheath was inserted, however the Spray from the introducer indicating intra-arterial positioning was minimal. Decision was made to not place the Angio-Seal device. The Angio-Seal sheath and guidewire were removed and hemostasis achieved with 15 minutes of manual compression. IMPRESSION: Inferior mesenteric angiogram demonstrates no active extravasation within the sigmoid colon. No embolization was performed. Electronically Signed   By: Miachel Roux M.D.   On: 09/29/2022 08:22   CT ANGIO GI BLEED  Result Date: 09/28/2022 CLINICAL DATA:  Lower gastrointestinal bleeding. EXAM: CTA ABDOMEN AND PELVIS WITHOUT AND WITH CONTRAST TECHNIQUE: Multidetector CT imaging of the abdomen and pelvis was performed using the standard protocol during bolus administration of intravenous contrast. Multiplanar  reconstructed images and MIPs were obtained and reviewed to evaluate the  vascular anatomy. RADIATION DOSE REDUCTION: This exam was performed according to the departmental dose-optimization program which includes automated exposure control, adjustment of the mA and/or kV according to patient size and/or use of iterative reconstruction technique. CONTRAST:  55m OMNIPAQUE IOHEXOL 350 MG/ML SOLN COMPARISON:  April 19, 2022. FINDINGS: VASCULAR Aorta: Atherosclerosis of abdominal aorta is noted with probable large atherosclerotic ulcer. No definite aneurysm or dissection is noted. Celiac: Patent without evidence of aneurysm, dissection, vasculitis or significant stenosis. SMA: Severe stenosis is noted in the proximal portion without thrombus. Renals: Both renal arteries are patent without evidence of aneurysm, dissection, vasculitis, fibromuscular dysplasia or significant stenosis. IMA: Patent without evidence of aneurysm, dissection, vasculitis or significant stenosis. Inflow: Patent without evidence of aneurysm, dissection, vasculitis or significant stenosis. Proximal Outflow: Bilateral common femoral and visualized portions of the superficial and profunda femoral arteries are patent without evidence of aneurysm, dissection, vasculitis or significant stenosis. Veins: No obvious venous abnormality within the limitations of this arterial phase study. Review of the MIP images confirms the above findings. NON-VASCULAR Lower chest: No acute abnormality. Hepatobiliary: No focal liver abnormality is seen. No gallstones, gallbladder wall thickening, or biliary dilatation. Pancreas: Unremarkable. No pancreatic ductal dilatation or surrounding inflammatory changes. Spleen: Normal in size without focal abnormality. Adrenals/Urinary Tract: Adrenal glands are unremarkable. Kidneys are normal, without renal calculi, focal lesion, or hydronephrosis. Bladder is unremarkable. Stomach/Bowel: The stomach and appendix are unremarkable.  There is no evidence of bowel obstruction or inflammation. Diverticulosis of sigmoid and descending colon is noted. Active contrast extravasation is seen in the proximal sigmoid colon consistent with gastrointestinal hemorrhage. Lymphatic: No adenopathy is noted. Reproductive: Status post prostatectomy. Other: No abdominal wall hernia or abnormality. No abdominopelvic ascites. Musculoskeletal: No acute or significant osseous findings. IMPRESSION: VASCULAR There is active contrast extravasation seen in the proximal sigmoid colon consistent with gastrointestinal hemorrhage. Critical Value/emergent results were called by telephone at the time of interpretation on 09/28/2022 at 5:49 pm [facility] verbally acknowledged these results. Severe stenosis is again noted involving the origin of the superior mesenteric artery. Atherosclerosis of abdominal aorta is noted with large penetrating atherosclerotic ulcer seen in infrarenal portion. NON-VASCULAR Diverticulosis of descending and sigmoid colon is noted. Aortic Atherosclerosis (ICD10-I70.0). Electronically Signed   By: JMarijo ConceptionM.D.   On: 09/28/2022 17:49    Assessment / Plan:  62year old male with CAD s/p stent on Plavix, with history of diverticular bleed in September 2023 (sigmoid colon, confirmed with CT-A) which resolved without intervention, who presented again with profuse painless hematochezia.  After going nearly 16 hours without a bowel movement, he again experienced profuse bleeding yesterday afternoon. A CT-A was completed which showed active hemorrhage in the proximal sigmoid colon. He underwent an inferior mesenteric angiogram by IR without evidence of active bleeding therefore coil embolization was not performed.Hg 13.6 -> 14.4 -> 13.2 -> Hg 12.1 -> 12.1 -> 11.7 -> today 11.3.  No BM or blood per rectum today.  He tolerated a soft diet for lunch. -Continue to monitor patient closely for GI bleeding -Consider colonoscopy if he  demonstrates any further hematochezia  -CBC in am -Transfuse for Hg < 8 -Await further recommendations per Dr. CCandis Schatz   Coronary artery disease s/p NSTEMI and  DES 08/31/2021. Brilinta was previously switched to Plavix after he had a diverticular bleed 01/2022. Plavix remains on hold since admission.  -Defer Plavix restart recommendations to Dr. CCandis Schatzand cardiology. Patient is one year our from  his MI/DES placement.     LOS: 0 days   Noralyn Pick  09/30/2022, 4:24PM   ------------------------------------------------------------------------------------------------------------------- GI Attending:  I have also seen the patient - here are my additional comments: No further bleeding. Hgb stable  I think if no bleeding by tomorrow may dc to home.  1) hold Plavix 2 weeks total  2) F/U PCP - f/u Hgb there - iron stores look good so do not think Fe SO4 required 3) F/U GI prn 4) If he has another bleeding episode consider colonoscopy depending upon scenario and if no lesion found/treated consider coming off Plavix completely 5) low fiber diet x 1 week then higher fiber, whole food diet  Signing off If bleeding returns call us back  Gatha Mayer, MD, [facility] See Shea Evans on call - gastroenterology for best contact person 09/30/2022 5:28 PM

## 2022-10-01 DIAGNOSIS — K922 Gastrointestinal hemorrhage, unspecified: Secondary | ICD-10-CM | POA: Diagnosis not present

## 2022-10-01 DIAGNOSIS — E782 Mixed hyperlipidemia: Secondary | ICD-10-CM | POA: Diagnosis not present

## 2022-10-01 DIAGNOSIS — D62 Acute posthemorrhagic anemia: Secondary | ICD-10-CM | POA: Diagnosis not present

## 2022-10-01 DIAGNOSIS — I700 Atherosclerosis of aorta: Secondary | ICD-10-CM | POA: Diagnosis not present

## 2022-10-01 LAB — MAGNESIUM: Magnesium: 1.9 mg/dL (ref 1.7–2.4)

## 2022-10-01 LAB — VITAMIN B12: Vitamin B-12: 286 pg/mL (ref 180–914)

## 2022-10-01 LAB — RENAL FUNCTION PANEL
Albumin: 3.3 g/dL — ABNORMAL LOW (ref 3.5–5.0)
Anion gap: 8 (ref 5–15)
BUN: 14 mg/dL (ref 8–23)
CO2: 29 mmol/L (ref 22–32)
Calcium: 8.7 mg/dL — ABNORMAL LOW (ref 8.9–10.3)
Chloride: 99 mmol/L (ref 98–111)
Creatinine, Ser: 0.94 mg/dL (ref 0.61–1.24)
GFR, Estimated: >60 mL/min (ref >60)
Glucose, Bld: 101 mg/dL — ABNORMAL HIGH (ref 70–99)
Phosphorus: 3.4 mg/dL (ref 2.5–4.6)
Potassium: 4 mmol/L (ref 3.5–5.1)
Sodium: 136 mmol/L (ref 135–145)

## 2022-10-01 LAB — IRON AND TIBC
Iron: 44 ug/dL — ABNORMAL LOW (ref 45–182)
Saturation Ratios: 12 % — ABNORMAL LOW (ref 17.9–39.5)
TIBC: 356 ug/dL (ref 250–450)
UIBC: 312 ug/dL

## 2022-10-01 LAB — RETICULOCYTES
Immature Retic Fract: 32 % — ABNORMAL HIGH (ref 2.3–15.9)
RBC.: 3.63 MIL/uL — ABNORMAL LOW (ref 4.22–5.81)
Retic Count, Absolute: 99.1 10*3/uL (ref 19.0–186.0)
Retic Ct Pct: 2.7 % (ref 0.4–3.1)

## 2022-10-01 LAB — CBC
HCT: 32.4 % — ABNORMAL LOW (ref 39.0–52.0)
Hemoglobin: 11.3 g/dL — ABNORMAL LOW (ref 13.0–17.0)
MCH: 31 pg (ref 26.0–34.0)
MCHC: 34.9 g/dL (ref 30.0–36.0)
MCV: 88.8 fL (ref 80.0–100.0)
Platelets: 336 10*3/uL (ref 150–400)
RBC: 3.65 MIL/uL — ABNORMAL LOW (ref 4.22–5.81)
RDW: 13.1 % (ref 11.5–15.5)
WBC: 9.9 10*3/uL (ref 4.0–10.5)
nRBC: 0 % (ref 0.0–0.2)

## 2022-10-01 LAB — FOLATE: Folate: 12.6 ng/mL (ref >5.9)

## 2022-10-01 LAB — FERRITIN: Ferritin: 26 ng/mL (ref 24–336)

## 2022-10-01 MED ORDER — SODIUM CHLORIDE 0.9 % IV SOLN
250.0000 mg | Freq: Every day | INTRAVENOUS | Status: DC
  Administered 2022-10-01: 250 mg via INTRAVENOUS
  Filled 2022-10-01: qty 20

## 2022-10-01 NOTE — Discharge Summary (Signed)
Physician Discharge Summary  Fadel Cheatam [id number] DOB: 31-Mar-1961 DOA: 09/27/2022  PCP: Ginger Organ., MD  Admit date: 09/27/2022 Discharge date: 10/01/2022 Admitted From: Home. Disposition: Home Recommendations for Outpatient Follow-up:  Follow up with PCP in 1 week Outpatient follow-up with cardiology in 1 to 2 weeks Check CMP and CBC in 1 week Recommend nonemergent referral to vascular surgery for SMA stenosis. Please follow up on the following pending results: None  Home Health: Not indicated Equipment/Devices: Not indicated  Discharge Condition: Stable CODE STATUS: Full code  Follow-up Information     Ginger Organ., MD. Schedule an appointment as soon as possible for a visit in 1 week(s).   Specialty: Internal Medicine Contact information: [place] [address] [phone]                 Hospital course 62 year old M with history of CAD s/p DES to LAD in 08/2021, diverticulosis, previous GI bleed thought to be diverticular and hyperlipidemia presenting with 7 episodes of bright red blood per rectum for about 6 hours prior to arrival to ED, and admitted for rectal bleed.  Patient is on Plavix.  No abdominal pain.  Hgb 16.  Hemodynamically stable.  GI consulted and following.   Patient has recurrence of hematochezia on 2/13.  CT angio showed extravasation in proximal sigmoid colon.  IR for embolization but no active hemorrhage during angiogram.  CT angio also showed severe superior mesenteric artery stenosis and aortic atherosclerosis.  Hgb trended down and leveled off at 11.3.  Anemia panel suggested iron deficiency.    On the day of discharge, patient tolerated soft diet for over 24 hours.  No further GI bleed but has not had bowel movement.  Cleared for discharge by gastroenterology for outpatient follow-up.  GI recommended holding Plavix at least for 2 weeks.  He will received IV ferric gluconate 250 mg x 1 prior to  discharge.  See individual problem list below for more.   Problems addressed during this hospitalization Principal Problem:   Acute lower GI bleeding Active Problems:   HLD (hyperlipidemia)   Diverticulosis of colon with hemorrhage   Hematochezia   Leukocytosis   Superior mesenteric artery stenosis (HCC)   Aortic atherosclerosis (HCC)   Acute blood loss anemia   Acute blood loss anemia due to acute lower GI bleeding:  likely diverticular.  Has history of severe diverticulosis.  CT angio showed extravasation to proximal sigmoid colon but no hemorrhage during inferior mesenteric artery angiogram.  Hemoglobin leveled off at 11.3.   History of CAD s/p LAD in 08/2021.  On Plavix at home. -GI recommended holding Plavix at least for 2 weeks. -Discontinued Plavix on discharge.  Has been advised to discuss with cardiologist about future need -Continue home Crestor   Severe superior mesenteric artery stenosis: Patient denies postprandial abdominal pain.   -Outpatient follow-up with vascular surgery   Hyperlipidemia/aortic atherosclerosis -Continue home Crestor   History of gout -Continue home allopurinol   Mild hyponatremia: Resolved.   Leukocytosis: Likely demargination.  Resolved.   Generalized weakness: Due to acute illness and lying in bed.  Resolved.           Vital signs Vitals:   09/30/22 1637 09/30/22 2101 10/01/22 0424 10/01/22 0802  BP: 114/63 130/69 121/63 124/83  Pulse: 61 61 69 63  Temp: 98.2 F (36.8 C) 98.4 F (36.9 C) 98.5 F (36.9 C) 98.5 F (36.9 C)  Resp: 18  16 17  $ Height:  Weight:      SpO2: 99% 96% 95% 93%  TempSrc:  Oral Oral Oral  BMI (Calculated):         Discharge exam  GENERAL: No apparent distress.  Nontoxic. HEENT: MMM.  Vision and hearing grossly intact.  NECK: Supple.  No apparent JVD.  RESP:  No IWOB.  Fair aeration bilaterally. CVS:  RRR. Heart sounds normal.  ABD/GI/GU: BS+. Abd soft, NTND.  MSK/EXT:  Moves extremities.  No apparent deformity. No edema.  SKIN: no apparent skin lesion or wound NEURO: Awake and alert. Oriented appropriately.  No apparent focal neuro deficit. PSYCH: Calm. Normal affect.   Discharge Instructions Discharge Instructions     Diet - low sodium heart healthy   Complete by: As directed    Low fiber residue diet for one week, high fiber diet after one week   Discharge instructions   Complete by: As directed    It has been a pleasure taking care of you!  You were hospitalized due to rectal bleed likely from diverticulosis.  We have stopped your Plavix at least for 2 weeks.  Your bleeding seems to have subsided.  You may discuss about future use of Plavix with your cardiologist.  Avoid any over-the-counter pain medication other than plain Tylenol.  Continue low fiber residue diet for the next 1 week, then high-fiber diet after that.  Avoid extraneous activity for 1 week.  Take care,   Increase activity slowly   Complete by: As directed    No wound care   Complete by: As directed       Allergies as of 10/01/2022       Reactions   Colchicine Diarrhea, Nausea And Vomiting        Medication List     STOP taking these medications    cephALEXin 500 MG capsule Commonly known as: KEFLEX   clopidogrel 75 MG tablet Commonly known as: Plavix       TAKE these medications    allopurinol 300 MG tablet Commonly known as: ZYLOPRIM Take 300 mg by mouth daily.   nitroGLYCERIN 0.4 MG SL tablet Commonly known as: NITROSTAT Place 1 tablet (0.4 mg total) under the tongue every 5 (five) minutes x 3 doses as needed for chest pain.   rosuvastatin 40 MG tablet Commonly known as: CRESTOR Take 1 tablet (40 mg total) by mouth at bedtime.        Consultations: Gastroenterology  Procedures/Studies:   IR Angiogram Visceral Selective  Result Date: 09/29/2022 INDICATION: 62 year old gentleman with acute sigmoid colon hemorrhage presents to IR for angiogram and possible  embolization. EXAM: 1. Ultrasound-guided access of right common femoral artery 2. Inferior mesenteric angiogram 3. Sub selective angiogram of inferior mesenteric artery branches (x4) MEDICATIONS: None ANESTHESIA/SEDATION: Moderate (conscious) sedation was employed during this procedure. A total of Versed 1 mg and Fentanyl 50 mcg was administered intravenously by the radiology nurse. Total intra-service moderate Sedation Time: 34 minutes. The patient's level of consciousness and vital signs were monitored continuously by radiology nursing throughout the procedure under my direct supervision. CONTRAST:  25 mL of Omnipaque 300 FLUOROSCOPY: Radiation Exposure Index (as provided by the fluoroscopic device): 123456 mGy Kerma COMPLICATIONS: None immediate. PROCEDURE: Informed consent was obtained from the patient following explanation of the procedure, risks, benefits and alternatives. The patient understands, agrees and consents for the procedure. All questions were addressed. A time out was performed prior to the initiation of the procedure. Maximal barrier sterile technique utilized including caps, mask, sterile gowns,  sterile gloves, large sterile drape, hand hygiene, and chlorhexidine prep. Ultrasound image documenting patency of the right common femoral artery was obtained and placed in permanent medical record. Sterile ultrasound probe cover and gel utilized throughout the procedure. Utilizing continuous ultrasound guidance, the right common femoral artery was accessed at the level of the femoral head with a 21 gauge needle. 21 gauge needle exchanged for a transitional dilator set over 0.018 inch guidewire. Transitional dilator set exchanged for 5 French sheath over 0.035 inch guidewire. Inferior mesenteric artery was selected with a rim catheter. Inferior mesenteric angiogram showed patent IMA branches. No active extravasation was identified. Four separate branches of the inferior mesenteric artery supplying the  sigmoid colon were individually selected with Progreat microcatheter and angiogram was performed. No active hemorrhage was identified Microcatheter and base catheter were removed over 0.035 inch Amplatz guidewire. Sheath angiogram confirmed appropriate puncture at the level of the femoral head. The groin was re-prepped and draped. Angioseal sheath was inserted, however the Spray from the introducer indicating intra-arterial positioning was minimal. Decision was made to not place the Angio-Seal device. The Angio-Seal sheath and guidewire were removed and hemostasis achieved with 15 minutes of manual compression. IMPRESSION: Inferior mesenteric angiogram demonstrates no active extravasation within the sigmoid colon. No embolization was performed. Electronically Signed   By: Miachel Roux M.D.   On: 09/29/2022 08:22   IR US Guide Vasc Access Right  Result Date: 09/29/2022 INDICATION: 62 year old gentleman with acute sigmoid colon hemorrhage presents to IR for angiogram and possible embolization. EXAM: 1. Ultrasound-guided access of right common femoral artery 2. Inferior mesenteric angiogram 3. Sub selective angiogram of inferior mesenteric artery branches (x4) MEDICATIONS: None ANESTHESIA/SEDATION: Moderate (conscious) sedation was employed during this procedure. A total of Versed 1 mg and Fentanyl 50 mcg was administered intravenously by the radiology nurse. Total intra-service moderate Sedation Time: 34 minutes. The patient's level of consciousness and vital signs were monitored continuously by radiology nursing throughout the procedure under my direct supervision. CONTRAST:  25 mL of Omnipaque 300 FLUOROSCOPY: Radiation Exposure Index (as provided by the fluoroscopic device): 123456 mGy Kerma COMPLICATIONS: None immediate. PROCEDURE: Informed consent was obtained from the patient following explanation of the procedure, risks, benefits and alternatives. The patient understands, agrees and consents for the procedure.  All questions were addressed. A time out was performed prior to the initiation of the procedure. Maximal barrier sterile technique utilized including caps, mask, sterile gowns, sterile gloves, large sterile drape, hand hygiene, and chlorhexidine prep. Ultrasound image documenting patency of the right common femoral artery was obtained and placed in permanent medical record. Sterile ultrasound probe cover and gel utilized throughout the procedure. Utilizing continuous ultrasound guidance, the right common femoral artery was accessed at the level of the femoral head with a 21 gauge needle. 21 gauge needle exchanged for a transitional dilator set over 0.018 inch guidewire. Transitional dilator set exchanged for 5 French sheath over 0.035 inch guidewire. Inferior mesenteric artery was selected with a rim catheter. Inferior mesenteric angiogram showed patent IMA branches. No active extravasation was identified. Four separate branches of the inferior mesenteric artery supplying the sigmoid colon were individually selected with Progreat microcatheter and angiogram was performed. No active hemorrhage was identified Microcatheter and base catheter were removed over 0.035 inch Amplatz guidewire. Sheath angiogram confirmed appropriate puncture at the level of the femoral head. The groin was re-prepped and draped. Angioseal sheath was inserted, however the Spray from the introducer indicating intra-arterial positioning was minimal. Decision was made to not  place the Angio-Seal device. The Angio-Seal sheath and guidewire were removed and hemostasis achieved with 15 minutes of manual compression. IMPRESSION: Inferior mesenteric angiogram demonstrates no active extravasation within the sigmoid colon. No embolization was performed. Electronically Signed   By: Miachel Roux M.D.   On: 09/29/2022 08:22   CT ANGIO GI BLEED  Result Date: 09/28/2022 CLINICAL DATA:  Lower gastrointestinal bleeding. EXAM: CTA ABDOMEN AND PELVIS WITHOUT  AND WITH CONTRAST TECHNIQUE: Multidetector CT imaging of the abdomen and pelvis was performed using the standard protocol during bolus administration of intravenous contrast. Multiplanar reconstructed images and MIPs were obtained and reviewed to evaluate the vascular anatomy. RADIATION DOSE REDUCTION: This exam was performed according to the departmental dose-optimization program which includes automated exposure control, adjustment of the mA and/or kV according to patient size and/or use of iterative reconstruction technique. CONTRAST:  10m OMNIPAQUE IOHEXOL 350 MG/ML SOLN COMPARISON:  April 19, 2022. FINDINGS: VASCULAR Aorta: Atherosclerosis of abdominal aorta is noted with probable large atherosclerotic ulcer. No definite aneurysm or dissection is noted. Celiac: Patent without evidence of aneurysm, dissection, vasculitis or significant stenosis. SMA: Severe stenosis is noted in the proximal portion without thrombus. Renals: Both renal arteries are patent without evidence of aneurysm, dissection, vasculitis, fibromuscular dysplasia or significant stenosis. IMA: Patent without evidence of aneurysm, dissection, vasculitis or significant stenosis. Inflow: Patent without evidence of aneurysm, dissection, vasculitis or significant stenosis. Proximal Outflow: Bilateral common femoral and visualized portions of the superficial and profunda femoral arteries are patent without evidence of aneurysm, dissection, vasculitis or significant stenosis. Veins: No obvious venous abnormality within the limitations of this arterial phase study. Review of the MIP images confirms the above findings. NON-VASCULAR Lower chest: No acute abnormality. Hepatobiliary: No focal liver abnormality is seen. No gallstones, gallbladder wall thickening, or biliary dilatation. Pancreas: Unremarkable. No pancreatic ductal dilatation or surrounding inflammatory changes. Spleen: Normal in size without focal abnormality. Adrenals/Urinary Tract:  Adrenal glands are unremarkable. Kidneys are normal, without renal calculi, focal lesion, or hydronephrosis. Bladder is unremarkable. Stomach/Bowel: The stomach and appendix are unremarkable. There is no evidence of bowel obstruction or inflammation. Diverticulosis of sigmoid and descending colon is noted. Active contrast extravasation is seen in the proximal sigmoid colon consistent with gastrointestinal hemorrhage. Lymphatic: No adenopathy is noted. Reproductive: Status post prostatectomy. Other: No abdominal wall hernia or abnormality. No abdominopelvic ascites. Musculoskeletal: No acute or significant osseous findings. IMPRESSION: VASCULAR There is active contrast extravasation seen in the proximal sigmoid colon consistent with gastrointestinal hemorrhage. Critical Value/emergent results were called by telephone at the time of interpretation on 09/28/2022 at 5:49 pm [facility] verbally acknowledged these results. Severe stenosis is again noted involving the origin of the superior mesenteric artery. Atherosclerosis of abdominal aorta is noted with large penetrating atherosclerotic ulcer seen in infrarenal portion. NON-VASCULAR Diverticulosis of descending and sigmoid colon is noted. Aortic Atherosclerosis (ICD10-I70.0). Electronically Signed   By: JMarijo ConceptionM.D.   On: 09/28/2022 17:49       The results of significant diagnostics from this hospitalization (including imaging, microbiology, ancillary and laboratory) are listed below for reference.     Microbiology: No results found for this or any previous visit (from the past 240 hour(s)).   Labs:  CBC: Recent Labs  Lab 09/27/22 0142 09/27/22 0400 09/27/22 0949 09/28/22 0406 09/28/22 1210 09/29/22 1144 09/29/22 1819 09/30/22 0407 09/30/22 1735 10/01/22 0434  WBC 11.7* 10.7*  --  9.7  --   --   --  8.6  --  9.9  NEUTROABS  --  8.4*  --   --   --   --   --   --   --   --   HGB 16.0 14.3   < > 13.6   < > 12.1* 11.7*  11.3* 11.5* 11.3*  HCT 49.0 41.8   < > 41.3   < > 35.2* 34.7* 33.8* 33.3* 32.4*  MCV 92.1 90.7  --  91.6  --   --   --  89.9  --  88.8  PLT 347 263  --  276  --   --   --  307  --  336   < > = values in this interval not displayed.   BMP &GFR Recent Labs  Lab 09/27/22 0142 09/27/22 0400 09/30/22 0407 10/01/22 0434  NA 135 135 132* 136  K 4.3 4.5 3.8 4.0  CL 103 105 98 99  CO2 23 24 27 29  $ GLUCOSE 124* 117* 105* 101*  BUN 14 15 15 14  $ CREATININE 1.14 1.01 0.98 0.94  CALCIUM 8.8* 8.0* 8.3* 8.7*  MG  --  2.0 2.0 1.9  PHOS  --   --  2.8 3.4   Estimated Creatinine Clearance: 87.9 mL/min (by C-G formula based on SCr of 0.94 mg/dL). Liver & Pancreas: Recent Labs  Lab 09/27/22 0142 09/27/22 0400 09/30/22 0407 10/01/22 0434  AST 45* 41  --   --   ALT 38 36  --   --   ALKPHOS 86 70  --   --   BILITOT 0.6 0.4  --   --   PROT 6.5 5.6*  --   --   ALBUMIN 3.6 3.1* 3.1* 3.3*   No results for input(s): "LIPASE", "AMYLASE" in the last 168 hours. No results for input(s): "AMMONIA" in the last 168 hours. Diabetic: No results for input(s): "HGBA1C" in the last 72 hours. Recent Labs  Lab 09/27/22 0140  GLUCAP 122*   Cardiac Enzymes: No results for input(s): "CKTOTAL", "CKMB", "CKMBINDEX", "TROPONINI" in the last 168 hours. No results for input(s): "PROBNP" in the last 8760 hours. Coagulation Profile: Recent Labs  Lab 09/27/22 0142  INR 1.0   Thyroid Function Tests: No results for input(s): "TSH", "T4TOTAL", "FREET4", "T3FREE", "THYROIDAB" in the last 72 hours. Lipid Profile: No results for input(s): "CHOL", "HDL", "LDLCALC", "TRIG", "CHOLHDL", "LDLDIRECT" in the last 72 hours. Anemia Panel: Recent Labs    10/01/22 0434  VITAMINB12 286  FOLATE 12.6  FERRITIN 26  TIBC 356  IRON 44*  RETICCTPCT 2.7   Urine analysis: No results found for: "COLORURINE", "APPEARANCEUR", "LABSPEC", "PHURINE", "GLUCOSEU", "HGBUR", "BILIRUBINUR", "KETONESUR", "PROTEINUR", "UROBILINOGEN",  "NITRITE", "LEUKOCYTESUR" Sepsis Labs: Invalid input(s): "PROCALCITONIN", "LACTICIDVEN"   SIGNED:  Mercy Riding, MD  [facility] 10/01/2022, 6:16 PM

## 2022-10-01 NOTE — Progress Notes (Signed)
Discharge instructions reviewed with pt and his wife.   Copy of instructions given to pt. No new scripts. Pt instructed to stop plavix, pt to follow up with his PCP for hgb check in 1 week, pt calling his MD office before leaving hospital to make that appointment. Discussed low fiber and high fiber diet, handouts provided in discharge instructions.  Pt to follow up with GI MD if bleeding recurs, pt states he has the GI MD office number.   Pt to be d/c'd via wheelchair with belongings, with wife.         To be escorted by staff.

## 2022-10-04 ENCOUNTER — Telehealth: Payer: Self-pay

## 2022-10-04 NOTE — Telephone Encounter (Signed)
-----   Message from Jerene Bears, MD sent at 10/04/2022  8:39 AM EST ----- Agree Please get him an appt with me and this is non urgent assuming he does not have further bleeding I do not think he will need another colonoscopy but I will discuss this with him at [facility]  ----- Message ----- From: Noralyn Pick, NP Sent: 10/04/2022   6:55 AM EST To: Algernon Huxley, RN; Jerene Bears, MD  Dr. Hilarie Fredrickson, patient was admitted to the hospital with GI bleed on Plavix, CTA showed active bleeding in the sigmoid, thought he would get coil embolization per IR but angiogram was negative. His bleeding subsided and he was discharged home with GI follow up PRN per Dr. Carlean Purl.  Patty, Pls call patient today and obtain an update, verify if he's had anymore bleeding since he was discharged and let Dr. Hilarie Fredrickson know.   He was very worried during his hospital course, likely recurrent diverticular  bleed. I think he would appreciate hospital follow up with you. I told him I would communicate with you to make sure you knew that he was in the hospital.   If you don't think he needs to be seen in office, please send him a secure chat message with your recommendations and to verify if he needs a colonoscopy earlier than his original recall (Patient had a colonoscopy in 2021).   At time of discharge he was instructed to hold Plavix x 2 weeks, he's 1 yr out from PCI/DES  Thanks so much!

## 2022-10-04 NOTE — Telephone Encounter (Signed)
Spoke with pt and he states he has not had any more bleeding. He did have a stool today that was a little darker than normal. He knows to call if he sees any more blood.

## 2022-10-04 NOTE — Telephone Encounter (Signed)
-----   Message from Noralyn Pick, NP sent at 10/04/2022  6:41 AM EST ----- Dr. Hilarie Fredrickson, patient was admitted to the hospital with GI bleed on Plavix, CTA showed active bleeding in the sigmoid, thought he would get coil embolization per IR but angiogram was negative. His bleeding subsided and he was discharged home with GI follow up PRN per Dr. Carlean Purl.  Patty, Pls call patient today and obtain an update, verify if he's had anymore bleeding since he was discharged and let Dr. Hilarie Fredrickson know.   He was very worried during his hospital course, likely recurrent diverticular  bleed. I think he would appreciate hospital follow up with you. I told him I would communicate with you to make sure you knew that he was in the hospital.   If you don't think he needs to be seen in office, please send him a secure chat message with your recommendations and to verify if he needs a colonoscopy earlier than his original recall (Patient had a colonoscopy in 2021).   At time of discharge he was instructed to hold Plavix x 2 weeks, he's 1 yr out from PCI/DES  Thanks so much!

## 2022-10-04 NOTE — Telephone Encounter (Signed)
Pt scheduled to see Dr. Hilarie Fredrickson 12/15/22 at 1:50pm.

## 2022-10-05 ENCOUNTER — Encounter: Payer: Self-pay | Admitting: Internal Medicine

## 2022-10-05 ENCOUNTER — Ambulatory Visit: Payer: No Typology Code available for payment source | Attending: Internal Medicine | Admitting: Internal Medicine

## 2022-10-05 VITALS — BP 124/80 | HR 61 | Ht 71.0 in | Wt 195.0 lb

## 2022-10-05 DIAGNOSIS — I2510 Atherosclerotic heart disease of native coronary artery without angina pectoris: Secondary | ICD-10-CM | POA: Diagnosis not present

## 2022-10-05 DIAGNOSIS — I10 Essential (primary) hypertension: Secondary | ICD-10-CM

## 2022-10-05 DIAGNOSIS — I471 Supraventricular tachycardia, unspecified: Secondary | ICD-10-CM | POA: Insufficient documentation

## 2022-10-05 MED ORDER — METOPROLOL SUCCINATE ER 25 MG PO TB24: 12.5000 mg | ORAL_TABLET | Freq: Every day | ORAL | 5 refills | Status: DC

## 2022-10-05 NOTE — Progress Notes (Signed)
HPI Kevin Wallace is referred by Dr. Gwenlyn Found for evaluation of NS SVT and NS VT. He is a pleasant 62 yo man with low normal LV function s/p NSTEMI with PCI/stenting of a 95% LAD a year ago. He has occaisional exertional angina. He has taken slNTG twice and had near syncope, likely from drops in the bp. He had very mild palpitations. Initially he was not placed on a beta blocker due to sinus bradycardia. He is able to walk and his HR goes over 100/min. He denies peripheral edema. He has sleep apnea but has been non-compliant.  Allergies  Allergen Reactions   Colchicine Diarrhea and Nausea And Vomiting     Current Outpatient Medications  Medication Sig Dispense Refill   allopurinol (ZYLOPRIM) 300 MG tablet Take 300 mg by mouth daily.     metoprolol succinate (TOPROL XL) 25 MG 24 hr tablet Take 0.5 tablets (12.5 mg total) by mouth daily. Take with a meal. 30 tablet 5   nitroGLYCERIN (NITROSTAT) 0.4 MG SL tablet Place 1 tablet (0.4 mg total) under the tongue every 5 (five) minutes x 3 doses as needed for chest pain. 25 tablet 12   rosuvastatin (CRESTOR) 40 MG tablet Take 1 tablet (40 mg total) by mouth at bedtime. 60 tablet 4   No current facility-administered medications for this visit.     Past Medical History:  Diagnosis Date   Allergy    CAD (coronary artery disease)    s/p DES to mLAD 08/31/21   Cancer ([place]) 11/2019   s/p prostatectomy   Complication of anesthesia    took a long time to wear off    Gout    High cholesterol    Crestor    ROS:   All systems reviewed and negative except as noted in the HPI.   Past Surgical History:  Procedure Laterality Date   ANKLE ARTHROSCOPY     "had it cleaned out"   CARDIAC CATHETERIZATION     COLONOSCOPY  10/14/2014   IR ANGIOGRAM VISCERAL SELECTIVE  09/28/2022   IR US GUIDE VASC ACCESS RIGHT  09/28/2022   KNEE ARTHROSCOPY  07/2019   KNEE ARTHROSCOPY  1980s   LEFT HEART CATH AND CORONARY ANGIOGRAPHY N/A 08/31/2021   Procedure:  LEFT HEART CATH AND CORONARY ANGIOGRAPHY;  Surgeon: Burnell Blanks, MD;  Location: [facility];  Service: Cardiovascular;  Laterality: N/A;   LYMPHADENECTOMY Bilateral 02/28/2020   Procedure: LYMPHADENECTOMY, PELVIC;  Surgeon: Raynelle Bring, MD;  Location: [facility];  Service: Urology;  Laterality: Bilateral;   ROBOT ASSISTED LAPAROSCOPIC RADICAL PROSTATECTOMY N/A 02/28/2020   Procedure: XI ROBOTIC ASSISTED LAPAROSCOPIC RADICAL PROSTATECTOMY LEVEL 2;  Surgeon: Raynelle Bring, MD;  Location: [facility];  Service: Urology;  Laterality: N/A;     Family History  Problem Relation Age of Onset   Rheumatic fever Father 40       Died from complications of RF but might have been a heart attack   Colon cancer Neg Hx    Rectal cancer Neg Hx    Stomach cancer Neg Hx    Esophageal cancer Neg Hx      Social History   Socioeconomic History   Marital status: Married    Spouse name: Kevin Wallace   Number of children: 2   Years of education: 18   Highest education level: Master's degree (e.g., MA, MS, MEng, MEd, MSW, MBA)  Occupational History   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     Social Determinants of Health   Financial Resource Strain: Not on file  Food Insecurity: No Food Insecurity (04/20/2022)   Hunger Vital Sign    Worried About Running Out of Food in the Last Year: Never true    Ran Out of Food in the Last Year: Never true  Transportation Needs: No Transportation Needs (04/20/2022)   PRAPARE - Hydrologist (Medical): No    Lack of Transportation (Non-Medical): No  Physical Activity: Not on file  Stress: Not on file  Social  Connections: Not on file  Intimate Partner Violence: Not At Risk (04/20/2022)   Humiliation, Afraid, Rape, and Kick questionnaire    Fear of Current or Ex-Partner: No    Emotionally Abused: No    Physically Abused: No    Sexually Abused: No     BP 124/80   Pulse 61   Ht 5' 11"$  (1.803 m)   Wt 195 lb (88.5 kg)   SpO2 99%   BMI 27.20 kg/m   Physical Exam:  Well appearing NAD HEENT: Unremarkable Neck:  No JVD, no thyromegally Lymphatics:  No adenopathy Back:  No CVA tenderness Lungs:  Clear with no wheezes HEART:  Regular rate rhythm, no murmurs, no rubs, no clicks Abd:  soft, positive bowel sounds, no organomegally, no rebound, no guarding Ext:  2 plus pulses, no edema, no cyanosis, no clubbing Skin:  No rashes no nodules Neuro:  CN II through XII intact, motor grossly intact  EKG - nsr  Assess/Plan: NS AT and NS VT - he is minimally symptomatic. Despite his propensity to bradycardia, and because he also has residual CAD, I have recommended a trial of low dose toprol.  CAD - as per Dr. Gwenlyn Found. I have encouraged him to avoid NTG as he has a propensity to hypotension. Hopefully the low dose toprol will help prevent angina. Sleep apnea - as per Dr. Claiborne Billings. I explained that he needs to work on this as untreated sleep apnea will increase his risk for more arrhythmia.  Kevin Overlie Numan Zylstra,MD

## 2022-10-05 NOTE — Patient Instructions (Addendum)
Medication Instructions:  Your physician has recommended you make the following change in your medication: START Taking:  Metoprolol Succinate, or Toprol XL  Toprol XL:  25 mg You will-   Take 0.5 tablets (12.5 mg total) by mouth daily. Take with a meal   Lab Work: None ordered.  If you have labs (blood work) drawn today and your tests are completely normal, you will receive your results only by: [place] (if you have MyChart) OR A paper copy in the mail If you have any lab test that is abnormal or we need to change your treatment, we will call you to review the results.  Testing/Procedures: None ordered.  Follow-Up: At [facility], you and your health needs are our priority.  As part of our continuing mission to provide you with exceptional heart care, we have created designated Provider Care Teams.  These Care Teams include your primary Cardiologist (physician) and Advanced Practice Providers (APPs -  Physician Assistants and Nurse Practitioners) who all work together to provide you with the care you need, when you need it.  We recommend signing up for the patient portal called "MyChart".  Sign up information is provided on this After Visit Summary.  MyChart is used to connect with patients for Virtual Visits (Telemedicine).  Patients are able to view lab/test results, encounter notes, upcoming appointments, etc.  Non-urgent messages can be sent to your provider as well.   To learn more about what you can do with MyChart, go to [email or web address].    Your next appointment:   AS NEEDED   The format for your next appointment:   In Person  Provider:   Cristopher Peru, MD{or one of the following Advanced Practice Providers on your designated Care Team:   Tommye Standard, [place] Legrand Como "Jonni Sanger" [place], [place]  Metoprolol Extended-Release Tablets What is this medication? METOPROLOL (me TOE proe lole) treats high blood pressure and heart failure. It may also be used to prevent  chest pain (angina). It works by lowering your blood pressure and heart rate, making it easier for your heart to pump blood to the rest of your body. It belongs to a group of medications called beta blockers. This medicine may be used for other purposes; ask your health care provider or pharmacist if you have questions. COMMON BRAND NAME(S): toprol, Toprol XL What should I tell my care team before I take this medication? They need to know if you have any of these conditions: Diabetes Heart or vessel disease, such as slow heartbeat, worsening heart failure, heart block, sick sinus syndrome, or Raynaud syndrome Kidney disease Liver disease Lung or breathing disease, such as asthma or emphysema Pheochromocytoma Thyroid disease An unusual or allergic reaction to metoprolol, other medications, foods, dyes, or preservatives Pregnant or trying to get pregnant Breastfeeding How should I use this medication? Take this medication by mouth. Take it as directed on the prescription label at the same time every day. Take it with food. You may cut the tablet in half if it is scored (has a line in the middle of it). This may help you swallow the tablet if the whole tablet is too big. Be sure to take both halves. Do not take just one-half of the tablet. Keep taking it unless your care team tells you to stop. Talk to your care team about the use of this medication in children. While it may be prescribed for children as Kevin Wallace as 6 years for selected conditions, precautions do apply. Overdosage: If you  think you have taken too much of this medicine contact a poison control center or emergency room at once. NOTE: This medicine is only for you. Do not share this medicine with others. What if I miss a dose? If you miss a dose, take it as soon as you can. If it is almost time for your next dose, take only that dose. Do not take double or extra doses. What may interact with this medication? This medication may  interact with the following: Certain medications for blood pressure, heart disease, irregular heartbeat Certain medications for depression, like monoamine oxidase (MAO) inhibitors, fluoxetine, or paroxetine Clonidine Dobutamine Epinephrine Isoproterenol Reserpine This list may not describe all possible interactions. Give your health care provider a list of all the medicines, herbs, non-prescription drugs, or dietary supplements you use. Also tell them if you smoke, drink alcohol, or use illegal drugs. Some items may interact with your medicine. What should I watch for while using this medication? Visit your care team for regular checks on your progress. Check your blood pressure as directed. Know what your blood pressure should be and when to contact your care team. Do not treat yourself for coughs, colds, or pain while you are using this medication without asking your care team for advice. Some medications may increase your blood pressure. This medication may affect your coordination, reaction time, or judgment. Do not drive or operate machinery until you know how this medication affects you. Sit up or stand slowly to reduce the risk of dizzy or fainting spells. Drinking alcohol with this medication can increase the risk of these side effects. This medication may increase blood sugar. Ask your care team if changes in diet or medications are needed if you have diabetes. What side effects may I notice from receiving this medication? Side effects that you should report to your care team as soon as possible: Allergic reactions--skin rash, itching, hives, swelling of the face, lips, tongue, or throat Heart failure--shortness of breath, swelling of the ankles, feet, or hands, sudden weight gain, unusual weakness or fatigue Low blood pressure--dizziness, feeling faint or lightheaded, blurry vision Raynaud's--cool, numb, or painful fingers or toes that may change color from pale, to blue, to red Slow  heartbeat--dizziness, feeling faint or lightheaded, confusion, trouble breathing, unusual weakness or fatigue Worsening mood, feelings of depression Side effects that usually do not require medical attention (report to your care team if they continue or are bothersome): Change in sex drive or performance Diarrhea Dizziness Fatigue Headache This list may not describe all possible side effects. Call your doctor for medical advice about side effects. You may report side effects to FDA at 1-800-FDA-1088. Where should I keep my medication? Keep out of the reach of children and pets. Store at room temperature between 20 and 25 degrees C (68 and 77 degrees F). Throw away any unused medication after the expiration date. NOTE: This sheet is a summary. It may not cover all possible information. If you have questions about this medicine, talk to your doctor, pharmacist, or health care provider.  2023 Elsevier/Gold Standard (2007-09-23 00:00:00)

## 2022-11-03 ENCOUNTER — Ambulatory Visit: Payer: No Typology Code available for payment source | Admitting: Cardiovascular Disease

## 2022-11-04 ENCOUNTER — Encounter: Payer: Self-pay | Admitting: *Deleted

## 2022-12-02 ENCOUNTER — Encounter: Payer: Self-pay | Admitting: Cardiovascular Disease

## 2022-12-15 ENCOUNTER — Encounter: Payer: Self-pay | Admitting: Internal Medicine

## 2022-12-15 ENCOUNTER — Other Ambulatory Visit (INDEPENDENT_AMBULATORY_CARE_PROVIDER_SITE_OTHER): Payer: No Typology Code available for payment source

## 2022-12-15 ENCOUNTER — Ambulatory Visit: Payer: No Typology Code available for payment source | Admitting: Internal Medicine

## 2022-12-15 VITALS — BP 126/76 | HR 68 | Ht 71.0 in | Wt 201.0 lb

## 2022-12-15 DIAGNOSIS — K551 Chronic vascular disorders of intestine: Secondary | ICD-10-CM | POA: Diagnosis not present

## 2022-12-15 DIAGNOSIS — D509 Iron deficiency anemia, unspecified: Secondary | ICD-10-CM

## 2022-12-15 DIAGNOSIS — D508 Other iron deficiency anemias: Secondary | ICD-10-CM | POA: Diagnosis not present

## 2022-12-15 DIAGNOSIS — K5731 Diverticulosis of large intestine without perforation or abscess with bleeding: Secondary | ICD-10-CM

## 2022-12-15 LAB — CBC
HCT: 43.5 % (ref 39.0–52.0)
Hemoglobin: 14.2 g/dL (ref 13.0–17.0)
MCHC: 32.7 g/dL (ref 30.0–36.0)
MCV: 84.7 fl (ref 78.0–100.0)
Platelets: 379 10*3/uL (ref 150.0–400.0)
RBC: 5.13 Mil/uL (ref 4.22–5.81)
RDW: 14.7 % (ref 11.5–15.5)
WBC: 6.4 10*3/uL (ref 4.0–10.5)

## 2022-12-15 LAB — IBC + FERRITIN
Ferritin: 5.4 ng/mL — ABNORMAL LOW (ref 22.0–322.0)
Iron: 45 ug/dL (ref 42–165)
Saturation Ratios: 9.7 % — ABNORMAL LOW (ref 20.0–50.0)
TIBC: 464.8 ug/dL — ABNORMAL HIGH (ref 250.0–450.0)
Transferrin: 332 mg/dL (ref 212.0–360.0)

## 2022-12-15 NOTE — Patient Instructions (Addendum)
Your provider has requested that you go to the basement level for lab work before leaving today. Press "B" on the elevator. The lab is located at the first door on the left as you exit the elevator.  You have been referred to Dr Coral Else at [facility]. If you have not heard from their office within 1-2 weeks, please their office at number listed below to schedule.  Location : [address] [place], [place] [address] [phone]  _______________________________________________________  If your blood pressure at your visit was 140/90 or greater, please contact your primary care physician to follow up on this.  _______________________________________________________  If you are age 62 or older, your body mass index should be between 23-30. Your Body mass index is 28.03 kg/m. If this is out of the aforementioned range listed, please consider follow up with your Primary Care Provider.  If you are age 63 or younger, your body mass index should be between 19-25. Your Body mass index is 28.03 kg/m. If this is out of the aformentioned range listed, please consider follow up with your Primary Care Provider.   ________________________________________________________  The [facility] providers would like to encourage you to use [facility] to communicate with providers for non-urgent requests or questions.  Due to long hold times on the telephone, sending your provider a message by [facility] may be a faster and more efficient way to get a response.  Please allow 48 business hours for a response.  Please remember that this is for non-urgent requests.  _______________________________________________________  Thank you for choosing me and [facility].  Dr. Vonna Kotyk Pyrtle

## 2022-12-16 ENCOUNTER — Encounter: Payer: Self-pay | Admitting: Internal Medicine

## 2022-12-16 NOTE — Progress Notes (Signed)
Subjective:    Patient ID: Kevin Wallace, male    DOB: [birth year], 62 y.o.   MRN: [id number]  HPI Kevin Wallace is a 62 year old male with a history of recurrent diverticular hemorrhage most recently in February 2024, CAD with PCI in January 2023 previously on Brilinta and then Plavix but now on aspirin only, hyperlipidemia who is seen in hospital follow-up.  He is here alone today.  He was hospitalized in mid February with hematochezia.  CTA showed diverticular hemorrhage from the sigmoid but angiography was negative.  He was observed and the bleeding stopped.  He did have a posthemorrhagic anemia but did not require transfusion.  Since his hospitalization Plavix has been stopped per cardiology and he is taking low-dose aspirin.  He is feeling well.  He is having no abdominal pain.  Bowel movements are regular.  No further blood in stool.  No melena.  Occasional heartburn but no dysphagia or odynophagia.  He does not have any unexplained weight loss or postprandial abdominal pain.  He was unaware of the CTA findings regarding SMA stenosis.   Review of Systems As per HPI, otherwise negative  Current Medications, Allergies, Past Medical History, Past Surgical History, Family History and Social History were reviewed in [employer] record.    Objective:   Physical Exam BP 126/76   Pulse 68   Ht 5\' 11"  (1.803 m)   Wt 201 lb (91.2 kg)   BMI 28.03 kg/m  Gen: awake, alert, NAD HEENT: anicteric  Abd: soft, NT/ND, +BS throughout Ext: no c/c/e Neuro: nonfocal     Latest Ref Rng & Units 12/15/2022    2:39 PM 10/01/2022    4:34 AM 09/30/2022    5:35 PM  CBC  WBC 4.0 - 10.5 K/uL 6.4  9.9    Hemoglobin 13.0 - 17.0 g/dL 40.9  81.1  91.4   Hematocrit 39.0 - 52.0 % 43.5  32.4  33.3   Platelets 150.0 - 400.0 K/uL 379.0  336     Iron/TIBC/Ferritin/ %Sat    Component Value Date/Time   IRON 45 12/15/2022 1439   TIBC 464.8 (H) 12/15/2022 1439   FERRITIN 5.4 (L)  12/15/2022 1439   IRONPCTSAT 9.7 (L) 12/15/2022 1439   CTA ABDOMEN AND PELVIS WITHOUT AND WITH CONTRAST   TECHNIQUE: Multidetector CT imaging of the abdomen and pelvis was performed using the standard protocol during bolus administration of intravenous contrast. Multiplanar reconstructed images and MIPs were obtained and reviewed to evaluate the vascular anatomy.   RADIATION DOSE REDUCTION: This exam was performed according to the departmental dose-optimization program which includes automated exposure control, adjustment of the mA and/or kV according to patient size and/or use of iterative reconstruction technique.   CONTRAST:  75mL OMNIPAQUE IOHEXOL 350 MG/ML SOLN   COMPARISON:  April 19, 2022.   FINDINGS: VASCULAR   Aorta: Atherosclerosis of abdominal aorta is noted with probable large atherosclerotic ulcer. No definite aneurysm or dissection is noted.   Celiac: Patent without evidence of aneurysm, dissection, vasculitis or significant stenosis.   SMA: Severe stenosis is noted in the proximal portion without thrombus.   Renals: Both renal arteries are patent without evidence of aneurysm, dissection, vasculitis, fibromuscular dysplasia or significant stenosis.   IMA: Patent without evidence of aneurysm, dissection, vasculitis or significant stenosis.   Inflow: Patent without evidence of aneurysm, dissection, vasculitis or significant stenosis.   Proximal Outflow: Bilateral common femoral and visualized portions of the superficial and profunda femoral arteries are patent without evidence  of aneurysm, dissection, vasculitis or significant stenosis.   Veins: No obvious venous abnormality within the limitations of this arterial phase study.   Review of the MIP images confirms the above findings.   NON-VASCULAR   Lower chest: No acute abnormality.   Hepatobiliary: No focal liver abnormality is seen. No gallstones, gallbladder wall thickening, or biliary  dilatation.   Pancreas: Unremarkable. No pancreatic ductal dilatation or surrounding inflammatory changes.   Spleen: Normal in size without focal abnormality.   Adrenals/Urinary Tract: Adrenal glands are unremarkable. Kidneys are normal, without renal calculi, focal lesion, or hydronephrosis. Bladder is unremarkable.   Stomach/Bowel: The stomach and appendix are unremarkable. There is no evidence of bowel obstruction or inflammation. Diverticulosis of sigmoid and descending colon is noted. Active contrast extravasation is seen in the proximal sigmoid colon consistent with gastrointestinal hemorrhage.   Lymphatic: No adenopathy is noted.   Reproductive: Status post prostatectomy.   Other: No abdominal wall hernia or abnormality. No abdominopelvic ascites.   Musculoskeletal: No acute or significant osseous findings.   IMPRESSION: VASCULAR   There is active contrast extravasation seen in the proximal sigmoid colon consistent with gastrointestinal hemorrhage. Critical Value/emergent results were called by telephone at the time of interpretation on 09/28/2022 at 5:49 pm to provider [facility] , who verbally acknowledged these results.   Severe stenosis is again noted involving the origin of the superior mesenteric artery.   Atherosclerosis of abdominal aorta is noted with large penetrating atherosclerotic ulcer seen in infrarenal portion.   NON-VASCULAR   Diverticulosis of descending and sigmoid colon is noted.   Aortic Atherosclerosis (ICD10-I70.0).     Electronically Signed   By: Lupita Raider M.D.   On: 09/28/2022 17:49        Assessment & Plan:  62 year old male with a history of recurrent diverticular hemorrhage most recently in February 2024, CAD with PCI in January 2023 previously on Brilinta and then Plavix but now on aspirin only, hyperlipidemia who is seen in hospital follow-up.   Diverticular hemorrhage --second diverticular hemorrhage in the last 1+  year.  He is up-to-date with colonoscopy with his last exam having been 01/10/2020.  The prep was good and the exam was complete.  Diverticulosis was seen from the ascending colon to sigmoid colon.  There were small internal hemorrhoids.  We discussed the nature of diverticular hemorrhage and that should this occur in the future he will need to likely seek care quickly. -- Resolved -- Up-to-date with colonoscopy with no plans to repeat at this time -- Hopefully less likely to recur now that he is only on low-dose aspirin  2.  Posthemorrhagic iron deficiency --iron level remains low.  I am going to supplement with oral iron given that he is not anemic.  We need to follow iron counts.  He received 1 dose of IV iron prior to hospital discharge. -- Oral iron with attention to iron counts every 8 to 12 weeks  3.  SMA stenosis --no clinical signs of mesenteric ischemia however CTA shows a significant SMA stenosis.  I would like him to get vascular surgery opinion -- Referral to Dr. Myra Gianotti for superior mesenteric artery stenosis  30 minutes total spent today including patient facing time, coordination of care, reviewing medical history/procedures/pertinent radiology studies, and documentation of the encounter.

## 2022-12-20 ENCOUNTER — Other Ambulatory Visit: Payer: Self-pay

## 2022-12-20 DIAGNOSIS — D508 Other iron deficiency anemias: Secondary | ICD-10-CM

## 2022-12-28 ENCOUNTER — Ambulatory Visit: Payer: No Typology Code available for payment source | Admitting: Cardiology

## 2022-12-28 ENCOUNTER — Telehealth: Payer: Self-pay | Admitting: Cardiovascular Disease

## 2022-12-28 NOTE — Telephone Encounter (Signed)
Left message for patient informing him Dr. Mayford Knife is cancelling his appointment with her today. She states he does not need to see her and did not want him to incur new patient charges. He needs to follow-up with Dr. Tresa Endo to discuss and be referred for the [facility] device. Dr. Mayford Knife states Dr. Tresa Endo is able to do this and is already seeing patient for sleep.  Provided patient with number for [facility] office to call and schedule appt with Dr. Tresa Endo. Also provided [facility] office number to callback if any questions.

## 2022-12-28 NOTE — Progress Notes (Deleted)
      Cardiology Office Note    Date:  05/24/2022   ID:  Achille Julin, DOB 02/02/1961, MRN [id number]  PCP:  Shaw, Tequan D Jr., MD  Cardiologist:  Thomas Kelly, MD (sleep); Dr. Berry  5 month F/U sleep evaluation   History of Present Illness:  Kevin Wallace is a 62 y.o. male who is followed by Dr. Berry for cardiology care.  I saw him for initial sleep evaluation on Dec 24, 2021 prior to initiating CPAP therapy.  He presents for a 5-month follow-up evaluation.   Mr. Derk  suffered a non-ST segment elevation myocardial infarction in August 30, 2021 and cardiac catheterization revealed subtotal mid LAD stenosis with 50% ostial diagonal stenosis and scattered 20 to 30% lesions in other vessels.  He underwent successful PCI to his LAD with insertion of a 2.5 x 32 mm Synergy stent.  Subsequently, he has felt well and has been without recurrent anginal symptomatology.  He was seen by Angela Duke, PA on September 10, 2021 and due to complaints of snoring daytime sleepiness a sleep study was recommended.  He apparently was referred for a WatchPAT home sleep test utilizing peripheral arterial tonometry on September 17, 2021 which was interpreted by Dr. Murdis Flitton.  He was found to have mild overall sleep apnea with an AHI of 11.6.  Oxygen desaturated to a nadir of 81%.  He spent 17.6% of total sleep time and REM sleep but there is no data regarding the severity of his sleep apnea during REM sleep.  Apparently, he now presents to my schedule for further evaluation and treatment of his sleep sleep disordered breathing.  An Epworth Sleepiness Scale score was calculated in the office and this endorsed at 10 as shown below:   Epworth Sleepiness Scale: Situation   Chance of Dozing/Sleeping (0 = never , 1 = slight chance , 2 = moderate chance , 3 = high chance )   sitting and reading 1   watching TV 2   sitting inactive in a public place 0   being a passenger in a motor vehicle for an hour or more 2    lying down in the afternoon 3   sitting and talking to someone 0   sitting quietly after lunch (no alcohol) 2   while stopped for a few minutes in traffic as the driver 0   Total Score  10    He denied any bruxism, restless legs, hypnagogic or hypnopompic hallucinations or cataplectic events.  Mr. Alcindor received a new ResMed AirSense 11 AutoSet CPAP unit on Jan 05, 2022 with [facility] as his DME company.  His initial 3-month download from May 23 to April 04, 2022 showed 86% of usage days, but he was noncompliant with usage greater than 4 hours.  Average use was only 2 hours and 8 minutes.  His pressure was set at a range of 7 to 18 cm of water with an EPR of 3.  His 95th percentile pressure was 10.5 with maximum average pressure of 11.  He had significant mask leak and his AHI was 5.1.  Apparently it appears that he had initiated therapy with a ResMed N30i mask and ultimately tried the F30i mask.  He believes he may have kept it very loose.  He can had significant leak.  He basically became frustrated and usually would take the mask off.  A more recent download from September 6 through October 5 showed only 1 day of use with   total duration at 8 minutes.  Apparently he still has his machine and was told by [facility] that his insurance allowed him to keep his machine.  Presently, he admits that he goes to bed late oftentimes at least midnight.  He often wakes up at 6 to 6:30 AM.  He has noted some occasional headache.  A new Epworth Sleepiness Scale was calculated in the office today and this endorsed at 8 argue against significant residual daytime sleepiness.  Presently, he continues to be on rosuvastatin for hyperlipidemia.  He is now on clopidogrel instead of Brilinta for antiplatelet therapy and is not on aspirin.  He apparently had a diverticular bleed resulting in the change.  He presents for follow-up evaluation.   Past Medical History:  Diagnosis Date   Allergy    CAD (coronary artery  disease)    s/p DES to mLAD 08/31/21   Cancer (HCC) 11/2019   s/p prostatectomy   Complication of anesthesia    took a long time to wear off    Gout    High cholesterol    Crestor    Past Surgical History:  Procedure Laterality Date   ANKLE ARTHROSCOPY     "had it cleaned out"   CARDIAC CATHETERIZATION     COLONOSCOPY  10/14/2014   KNEE ARTHROSCOPY  07/2019   KNEE ARTHROSCOPY  1980s   LEFT HEART CATH AND CORONARY ANGIOGRAPHY N/A 08/31/2021   Procedure: LEFT HEART CATH AND CORONARY ANGIOGRAPHY;  Surgeon: McAlhany, Christopher D, MD;  Location: [facility];  Service: Cardiovascular;  Laterality: N/A;   LYMPHADENECTOMY Bilateral 02/28/2020   Procedure: LYMPHADENECTOMY, PELVIC;  Surgeon: Borden, Lester, MD;  Location: [facility];  Service: Urology;  Laterality: Bilateral;   ROBOT ASSISTED LAPAROSCOPIC RADICAL PROSTATECTOMY N/A 02/28/2020   Procedure: XI ROBOTIC ASSISTED LAPAROSCOPIC RADICAL PROSTATECTOMY LEVEL 2;  Surgeon: Borden, Lester, MD;  Location: [facility];  Service: Urology;  Laterality: N/A;    Current Medications: Outpatient Medications Prior to Visit  Medication Sig Dispense Refill   allopurinol (ZYLOPRIM) 300 MG tablet Take 300 mg by mouth daily.     clopidogrel (PLAVIX) 75 MG tablet Take 1 tablet (75 mg total) by mouth daily. 90 tablet 3   nitroGLYCERIN (NITROSTAT) 0.4 MG SL tablet Place 1 tablet (0.4 mg total) under the tongue every 5 (five) minutes x 3 doses as needed for chest pain. 25 tablet 12   rosuvastatin (CRESTOR) 40 MG tablet Take 1 tablet (40 mg total) by mouth daily. (Patient taking differently: Take 40 mg by mouth at bedtime.) 90 tablet 3   No facility-administered medications prior to visit.     Allergies:   Colchicine   Social History   Socioeconomic History   Marital status: Married    Spouse name: [name]   Number of children: 2   Years of education: 18   Highest education level: Master's degree (e.g., MA, MS, MEng, MEd, MSW, MBA)  Occupational  History   Occupation: desk work as a manager  Tobacco Use   Smoking status: Never   Smokeless tobacco: Never  Vaping Use   Vaping Use: Never used  Substance and Sexual Activity   Alcohol use: Not Currently   Drug use: No   Sexual activity: Yes    Birth control/protection: None  Other Topics Concern   Not on file  Social History Narrative   Lives with wife.     Social Determinants of Health   Financial Resource Strain: Not on file  Food Insecurity: No   Food Insecurity (04/20/2022)   Hunger Vital Sign    Worried About Running Out of Food in the Last Year: Never true    Ran Out of Food in the Last Year: Never true  Transportation Needs: No Transportation Needs (04/20/2022)   PRAPARE - Transportation    Lack of Transportation (Medical): No    Lack of Transportation (Non-Medical): No  Physical Activity: Not on file  Stress: Not on file  Social Connections: Not on file     Family History:  The patient's family history includes Rheumatic fever (age of onset: 52) in his father.   ROS General: Negative; No fevers, chills, or night sweats;  HEENT: Negative; No changes in vision or hearing, sinus congestion, difficulty swallowing Pulmonary: Negative; No cough, wheezing, shortness of breath, hemoptysis Cardiovascular: See HPI GI: History of diverticular bleed GU: Negative; No dysuria, hematuria, or difficulty voiding Musculoskeletal: Negative; no myalgias, joint pain, or weakness Hematologic/Oncology: Negative; no easy bruising, bleeding Endocrine: Negative; no heat/cold intolerance; no diabetes Neuro: Negative; no changes in balance, headaches Skin: Negative; No rashes or skin lesions Psychiatric: Negative; No behavioral problems, depression Sleep: snoring, daytime sleepiness, morning headaches, no  bruxism, restless legs, hypnogognic hallucinations, no cataplexy Other comprehensive 14 point system review is negative.   PHYSICAL EXAM:   VS:  BP 128/75 (BP Location: Left Arm,  Patient Position: Sitting, Cuff Size: Normal)   Pulse 75   Ht 5' 11" (1.803 m)   Wt 195 lb (88.5 kg)   BMI 27.20 kg/m     Repeat blood pressure by me was 122/76 repeat blood pressure by me was 116/68  Wt Readings from Last 3 Encounters:  05/24/22 195 lb (88.5 kg)  04/20/22 191 lb 2.2 oz (86.7 kg)  12/24/21 191 lb 6.4 oz (86.8 kg)    General: Alert, oriented, no distress.  Skin: normal turgor, no rashes, warm and dry HEENT: Normocephalic, atraumatic. Pupils equal round and reactive to light; sclera anicteric; extraocular muscles intact;  Nose without nasal septal hypertrophy Mouth/Parynx benign; Mallinpatti scale 3 Neck: No JVD, no carotid bruits; normal carotid upstroke Lungs: clear to ausculatation and percussion; no wheezing or rales Chest wall: without tenderness to palpitation Heart: PMI not displaced, RRR, s1 s2 normal, 1/6 systolic murmur, no diastolic murmur, no rubs, gallops, thrills, or heaves Abdomen: soft, nontender; no hepatosplenomehaly, BS+; abdominal aorta nontender and not dilated by palpation. Back: no CVA tenderness Pulses 2+ Musculoskeletal: full range of motion, normal strength, no joint deformities Extremities: no clubbing cyanosis or edema, Homan's sign negative  Neurologic: grossly nonfocal; Cranial nerves grossly wnl Psychologic: Normal mood and affect     Studies/Labs Reviewed:   I personally reviewed his most recent ECG from April 19, 2022 which showed sinus bradycardia at 47 bpm.  Dec 24, 2021 ECG (independently read by me): Sinus bradycardia at 48; no ectopy, normal intervals   Recent Labs:    Latest Ref Rng & Units 04/21/2022   10:06 AM 04/20/2022    3:01 AM 04/19/2022    7:20 AM  BMP  Glucose 70 - 99 mg/dL 98  89  111   BUN 6 - 20 mg/dL 8  13  20   Creatinine 0.61 - 1.24 mg/dL 1.01  0.91  1.07   Sodium 135 - 145 mmol/L 139  136  137   Potassium 3.5 - 5.1 mmol/L 4.1  3.7  3.9   Chloride 98 - 111 mmol/L 103  103  104   CO2 22 - 32 mmol/L  29    25  25   Calcium 8.9 - 10.3 mg/dL 8.9  8.6  9.1         Latest Ref Rng & Units 04/19/2022    7:20 AM 12/01/2021   12:49 PM 09/05/2021   12:55 AM  Hepatic Function  Total Protein 6.5 - 8.1 g/dL 6.9  7.5  7.1   Albumin 3.5 - 5.0 g/dL 3.9  4.8  3.9   AST 15 - 41 U/L 37  49  41   ALT 0 - 44 U/L 37  53  43   Alk Phosphatase 38 - 126 U/L 75  102  68   Total Bilirubin 0.3 - 1.2 mg/dL 0.6  0.5  0.9   Bilirubin, Direct 0.00 - 0.40 mg/dL  0.15         Latest Ref Rng & Units 04/21/2022   10:06 AM 04/20/2022    4:06 PM 04/20/2022    3:01 AM  CBC  WBC 4.0 - 10.5 K/uL 6.6  6.1  10.3   Hemoglobin 13.0 - 17.0 g/dL 13.2  13.5  14.4   Hematocrit 39.0 - 52.0 % 38.6  39.6  41.9   Platelets 150 - 400 K/uL 263  276  271    Lab Results  Component Value Date   MCV 91.9 04/21/2022   MCV 92.3 04/20/2022   MCV 92.3 04/20/2022   No results found for: "TSH" Lab Results  Component Value Date   HGBA1C 5.4 09/01/2021     BNP No results found for: "BNP"  ProBNP No results found for: "PROBNP"   Lipid Panel     Component Value Date/Time   CHOL 123 12/01/2021 1249   TRIG 119 12/01/2021 1249   HDL 44 12/01/2021 1249   CHOLHDL 2.8 12/01/2021 1249   CHOLHDL 3.3 09/01/2021 0404   VLDL 17 09/01/2021 0404   LDLCALC 58 12/01/2021 1249   LABVLDL 21 12/01/2021 1249     RADIOLOGY: No results found.   Additional studies/ records that were reviewed today include:   I reviewed the patient's catheterization data, records of Dr. Barry, as well as his Itamar sleep evaluation.  09/17/2021 FINDINGS: 1. Mild Obstructive Sleep Apnea with AHI 11.6/hr. 2. No Central Sleep Apnea with pAHIc 1.8/hr. 3. Oxygen desaturations as low as 81%. 4. Minimal snoring was present. O2 sats were < 88% for 0.3 min. 5. Total sleep time was 5 hrs and 37 min. 6. 17.6% of total sleep time was spent in REM sleep. 7. Normal sleep onset latency at 16 min. 8. Shortened REM sleep onset latency at 55 min. 9. Total awakenings  were 12.   DIAGNOSIS: Mild Obstructive Sleep Apnea (G47.33)    ASSESSMENT:    1. Obstructive sleep apnea   2. NSTEMI (non-ST elevated myocardial infarction) (HCC)   3. Dyslipidemia, goal LDL below 70   4. Lower GI bleed     PLAN:  Mr. Cottrell "Bill" Badger is a 62-year-old gentleman who has known CAD and suffered a non-ST segment elevation myocardial infarction on August 30, 2021 and was found to have 99% mid LAD stenosis which was successfully stented.  There was mild concomitant CAD.  He has a history of snoring and had noticed nonrestorative sleep with daytime sleepiness.  He was referred for an Itamar sleep study which was suggestive of at least mild overall sleep apnea with an AHI of 11.6.  I do not have any data regarding the severity of sleep apnea during REM sleep.  Minimal snoring was noted and   O2 saturation less than 88% was only short duration of 0.3 minutes.  Sleep duration was only 5 hours and 37 minutes with a shortened REM sleep onset latency at 55 minutes.  At my initial evaluation, I had a long discussion with Mr. Gottschall regarding sleep apnea and its potential adverse cardiovascular consequences if left untreated.  Presently, I reviewed his data with him in detail regarding his initiation of CPAP therapy.  He is noncompliant and when he was using the device over the initial 2 to 3 months, sleep duration with CPAP remains suboptimal at only slightly over 2 hours.  I again had a long discussion with him today and discussed implications of untreated sleep apnea with reference to potential blood pressure control, risk for nocturnal arrhythmias including atrial fibrillation, potential increased inflammation, insulin resistance, GERD, and particularly with his CAD history potential for nocturnal hypoxemia contributing to nocturnal ischemia.  He has tried both in 30i and F30i mask but it appears that he was uncertain as far as what pressure to put the mask on.  It appears that he has been  keeping it very loose leading to significant leak.  He would oftentimes stare and stay awake and finally decided to just take the mask off.  After much discussion today he wishes to retry treatment with more compliance.  I am slightly reducing his pressure settings to a range of 6 to 13 cm.  I discussed optimal sleep duration of approximately 7 and 9 hours or at least 7 to 8 hours with his age of 62.  Discussed the importance of exercise.  His blood pressure today is stable.  He continues to be on Plavix for antiplatelet therapy and apparently had a diverticular bleed while on Brilinta.  He continues to be on rosuvastatin 40 mg for hyperlipidemia.  He tells me he had soft plaque.  I have suggested the next time he sees Dr. Berry that he have an LP(a) checked particularly with risk for vulnerable plaque if his plaque was noncalcified.  He will be following up with Dr. Berry in several weeks.  I will see him in 6 months for sleep reevaluation or sooner as needed.    Medication Adjustments/Labs and Tests Ordered: Current medicines are reviewed at length with the patient today.  Concerns regarding medicines are outlined above.  Medication changes, Labs and Tests ordered today are listed in the Patient Instructions below. Patient Instructions  Follow-Up: At [facility], you and your health needs are our priority.  As part of our continuing mission to provide you with exceptional heart care, we have created designated Provider Care Teams.  These Care Teams include your primary Cardiologist (physician) and Advanced Practice Providers (APPs -  Physician Assistants and Nurse Practitioners) who all work together to provide you with the care you need, when you need it.  We recommend signing up for the patient portal called "MyChart".  Sign up information is provided on this After Visit Summary.  MyChart is used to connect with patients for Virtual Visits (Telemedicine).  Patients are able to view lab/test  results, encounter notes, upcoming appointments, etc.  Non-urgent messages can be sent to your provider as well.   To learn more about what you can do with MyChart, go to [email or web address].    Your next appointment:   6 month(s)  The format for your next appointment:   In Person  Provider:   Dr. Kelly (sleep clinic)        Signed, Thomas Kelly,   MD , FACC, ABSM Diplomate, American Board of Sleep Medicine  05/24/2022 9:10 AM    [facility] [address], [address], [place], [place]  [address] Phone: [phone]    

## 2022-12-29 NOTE — Telephone Encounter (Signed)
Dr. Tresa Endo  Does the patient need a follow up appointment with you prior to a referral being sent to ENT to discuss the Inspire Device?

## 2023-01-14 ENCOUNTER — Other Ambulatory Visit: Payer: Self-pay

## 2023-01-14 DIAGNOSIS — G4733 Obstructive sleep apnea (adult) (pediatric): Secondary | ICD-10-CM

## 2023-01-14 NOTE — Telephone Encounter (Signed)
If patient wants to be re-evaluated by me prior to considering Inspire, set up a a f/u with me.  If he is committed to have an Inspire evaluation, arrange an ENT evaluation with Dr. Jenne Pane to see if he qualifies.

## 2023-01-14 NOTE — Telephone Encounter (Signed)
Sent referral over to ENT just in case patient would like that route.  Thanks!

## 2023-01-31 ENCOUNTER — Ambulatory Visit (INDEPENDENT_AMBULATORY_CARE_PROVIDER_SITE_OTHER): Payer: No Typology Code available for payment source | Admitting: Surgery

## 2023-01-31 ENCOUNTER — Encounter: Payer: Self-pay | Admitting: Surgery

## 2023-01-31 VITALS — BP 143/87 | HR 54 | Temp 97.8°F | Resp 16 | Ht 71.0 in | Wt 201.0 lb

## 2023-01-31 DIAGNOSIS — K551 Chronic vascular disorders of intestine: Secondary | ICD-10-CM

## 2023-01-31 NOTE — Progress Notes (Signed)
[facility]  Patient name: Kevin Wallace MRN: [id number] DOB: August 03, 1961 Sex: male   REQUESTING PROVIDER:    Dr. Rhea Belton   REASON FOR CONSULT:    Mesenteric stenosis  HISTORY OF PRESENT ILLNESS:   Kevin Wallace is a 62 y.o. male, who is referred for evaluation of mesenteric stenosis.  The patient initially had this discovered when he was being worked up for a GI bleed which did require embolization.  This was in February 2024.  He denies any postprandial abdominal pain.  He denies any weight loss.  Patient does have a history of coronary artery disease, status post PCI to the LAD.  He is now on aspirin therapy alone.  He is medically managed for hypertension.  He takes a statin for hypercholesterolemia.  He is a non-smoker.  PAST MEDICAL HISTORY    Past Medical History:  Diagnosis Date   Allergy    Aortic atherosclerosis (HCC)    CAD (coronary artery disease)    s/p DES to mLAD 08/31/21   Cancer (HCC) 11/2019   s/p prostatectomy   Complication of anesthesia    took a long time to wear off    Diverticulosis    GI bleed    Gout    High cholesterol    Crestor   Superior mesenteric artery stenosis (HCC) 2024   severe     FAMILY HISTORY   Family History  Problem Relation Age of Onset   Rheumatic fever Father 71       Died from complications of RF but might have been a heart attack   Colon cancer Neg Hx    Rectal cancer Neg Hx    Stomach cancer Neg Hx    Esophageal cancer Neg Hx     SOCIAL HISTORY:   Social History   Socioeconomic History   Marital status: Married    Spouse name: [name]   Number of children: 2   Years of education: 18   Highest education level: Master's degree (e.g., MA, MS, MEng, MEd, MSW, MBA)  Occupational History   Occupation: desk work as a Production designer, theatre/television/film  Tobacco Use   Smoking status: Never   Smokeless tobacco: Never  Building services engineer Use: Never used  Substance and  Sexual Activity   Alcohol use: Not Currently   Drug use: No   Sexual activity: Yes    Birth control/protection: None  Other Topics Concern   Not on file  Social History Narrative   Lives with wife.     Social Determinants of Health   Financial Resource Strain: Not on file  Food Insecurity: No Food Insecurity (04/20/2022)   Hunger Vital Sign    Worried About Running Out of Food in the Last Year: Never true    Ran Out of Food in the Last Year: Never true  Transportation Needs: No Transportation Needs (04/20/2022)   PRAPARE - Administrator, Civil Service (Medical): No    Lack of Transportation (Non-Medical): No  Physical Activity: Not on file  Stress: Not on file  Social Connections: Not on file  Intimate Partner Violence: Not At Risk (04/20/2022)   Humiliation, Afraid, Rape, and Kick questionnaire    Fear of Current or Ex-Partner: No    Emotionally Abused: No    Physically Abused: No    Sexually Abused: No    ALLERGIES:    Allergies  Allergen Reactions   Colchicine Diarrhea and Nausea And Vomiting    CURRENT  MEDICATIONS:    Current Outpatient Medications  Medication Sig Dispense Refill   allopurinol (ZYLOPRIM) 300 MG tablet Take 300 mg by mouth daily.     aspirin 81 MG chewable tablet Chew by mouth daily.     metoprolol succinate (TOPROL XL) 25 MG 24 hr tablet Take 0.5 tablets (12.5 mg total) by mouth daily. Take with a meal. 30 tablet 5   rosuvastatin (CRESTOR) 40 MG tablet Take 1 tablet (40 mg total) by mouth at bedtime. 60 tablet 4   nitroGLYCERIN (NITROSTAT) 0.4 MG SL tablet Place 1 tablet (0.4 mg total) under the tongue every 5 (five) minutes x 3 doses as needed for chest pain. (Patient not taking: Reported on 12/15/2022) 25 tablet 12   No current facility-administered medications for this visit.    REVIEW OF SYSTEMS:   [X]  denotes positive finding, [ ]  denotes negative finding Cardiac  Comments:  Chest pain or chest pressure:    Shortness of breath  upon exertion:    Short of breath when lying flat:    Irregular heart rhythm:        Vascular    Pain in calf, thigh, or hip brought on by ambulation:    Pain in feet at night that wakes you up from your sleep:     Blood clot in your veins:    Leg swelling:         Pulmonary    Oxygen at home:    Productive cough:     Wheezing:         Neurologic    Sudden weakness in arms or legs:     Sudden numbness in arms or legs:     Sudden onset of difficulty speaking or slurred speech:    Temporary loss of vision in one eye:     Problems with dizziness:         Gastrointestinal    Blood in stool:      Vomited blood:         Genitourinary    Burning when urinating:     Blood in urine:        Psychiatric    Major depression:         Hematologic    Bleeding problems:    Problems with blood clotting too easily:        Skin    Rashes or ulcers:        Constitutional    Fever or chills:     PHYSICAL EXAM:   Vitals:   01/31/23 1357  BP: (!) 143/87  Pulse: (!) 54  Resp: 16  Temp: 97.8 F (36.6 C)  TempSrc: Temporal  SpO2: 94%  Weight: 201 lb (91.2 kg)  Height: 5\' 11"  (1.803 m)    GENERAL: The patient is a well-nourished male, in no acute distress. The vital signs are documented above. CARDIAC: There is a regular rate and rhythm.  VASCULAR: Palpable pedal pulses PULMONARY: Nonlabored respirations ABDOMEN: Soft and non-tender MUSCULOSKELETAL: There are no major deformities or cyanosis. NEUROLOGIC: No focal weakness or paresthesias are detected. SKIN: There are no ulcers or rashes noted. PSYCHIATRIC: The patient has a normal affect.  STUDIES:   I have reviewed the following CT scan: Aorta: Atherosclerosis of thoracic aorta is noted without aneurysm formation. There appears to be a large atherosclerotic ulcer in the infrarenal abdominal aorta.   Celiac: Patent without evidence of aneurysm, dissection, vasculitis or significant stenosis.   SMA: Severe focal  stenosis is noted in  the proximal portion without thrombus.   Renals: Both renal arteries are patent without evidence of aneurysm, dissection, vasculitis, fibromuscular dysplasia or significant stenosis.   IMA: Patent without evidence of aneurysm, dissection, vasculitis or significant stenosis.   Inflow: Patent without evidence of aneurysm, dissection, vasculitis or significant stenosis.   Proximal Outflow: Bilateral common femoral and visualized portions of the superficial and profunda femoral arteries are patent without evidence of aneurysm, dissection, vasculitis or significant stenosis.  ASSESSMENT and PLAN   SMA stenosis: The patient is asymptomatic.  From my perspective his stenosis is probably 50%.  As long as he remains asymptomatic, I would not recommend any intervention.  I discussed the signs and symptoms and what to look out for and to contact me if indicated.  He is already on best medical therapy, which he should continue.  He will follow-up in 1 year with repeat ultrasound  Aortic ulceration/dissection: He has palpable pedal pulses.  This is likely a chronic issue.  I will follow this for aneurysmal change.   Charlena Cross, MD, FACS [facility] [phone] Pager [phone]

## 2023-02-08 ENCOUNTER — Other Ambulatory Visit: Payer: Self-pay | Admitting: Family Medicine

## 2023-02-08 DIAGNOSIS — R519 Headache, unspecified: Secondary | ICD-10-CM

## 2023-02-09 ENCOUNTER — Other Ambulatory Visit: Payer: Self-pay

## 2023-02-09 ENCOUNTER — Encounter: Payer: Self-pay | Admitting: Cardiovascular Disease

## 2023-02-09 DIAGNOSIS — K551 Chronic vascular disorders of intestine: Secondary | ICD-10-CM

## 2023-02-14 ENCOUNTER — Telehealth: Payer: Self-pay | Admitting: *Deleted

## 2023-02-14 ENCOUNTER — Ambulatory Visit
Admission: RE | Admit: 2023-02-14 | Discharge: 2023-02-14 | Disposition: A | Discharge status: Home / Self Care | Payer: No Typology Code available for payment source | Source: Ambulatory Visit | Attending: Family Medicine | Admitting: Family Medicine

## 2023-02-14 DIAGNOSIS — R519 Headache, unspecified: Secondary | ICD-10-CM

## 2023-02-14 MED ORDER — GADOPICLENOL 0.5 MMOL/ML IV SOLN
9.0000 mL | Freq: Once | INTRAVENOUS | Status: AC | PRN
  Administered 2023-02-14: 9 mL via INTRAVENOUS

## 2023-02-14 NOTE — Telephone Encounter (Signed)
-----   Message from Chrystie Nose, RN sent at 02/14/2023  9:03 AM EDT ----- Regarding: FW: Labs  ----- Message ----- From: Chrystie Nose, RN Sent: 02/14/2023  12:00 AM EDT To: Chrystie Nose, RN Subject: Labs                                           Pt needs labs in 8 weeks, orders in epic.

## 2023-02-14 NOTE — Telephone Encounter (Signed)
Patient called and no answer. Left message that labs were due via Dr. Rhea Belton. Address, and lab hours given on VM. Also informed the patient the lab would be closed on Thursday due to July 4th holiday.

## 2023-02-17 ENCOUNTER — Encounter: Payer: Self-pay | Admitting: Cardiovascular Disease

## 2023-05-11 ENCOUNTER — Encounter: Payer: Self-pay | Admitting: Cardiovascular Disease

## 2023-07-13 ENCOUNTER — Encounter: Payer: Self-pay | Admitting: Neurology

## 2023-08-11 NOTE — Progress Notes (Signed)
NEUROLOGY CONSULTATION NOTE  Kevin Wallace MRN: [id number] DOB: 02-23-1961  Referring provider: Martha Clan, MD Primary care provider: Martha Clan, MD  Reason for consult:  migraines  Assessment/Plan:   Migraine without aura, without status migrainosus, not intractable  Migraine prevention:  Not indicated.  They are infrequent Migraine rescue:  Will provide samples of Nurtec as it is long-acting.  Triptans are contraindicated as he has coronary artery disease. Limit use of pain relievers to no more than 2 days out of week to prevent risk of rebound or medication-overuse headache. Keep headache diary Follow up 6 months.     Subjective:  Kevin Wallace is a 62 year old left-handed male with CAD s/p DES to mLAD, HLD and history of prostate cancer s/p prostatectomy who presents for migraines.  History supplemented by referring provider's note.  He has had 2 or 3 headaches over the past year.  He describes a diffuse throbbing headache, usually 6-7/10 but debilitating (10/10) at its peak in which he is bed-bound.  Lasts 2-3 days.  Associated with photophobia but denies nausea, vomiting, phonophobia, visual disturbance or unilateral numbness or weakness.  No known triggers.  Resting in bed with cold pack helps.  He takes ASA so he can only really take Tylenol which is ineffective.    MRI of brain with and without contrast on 02/14/2023 personally reviewed was normal.  In retrospect, he has history of severe headaches brought on by wine.    Past NSAIDS/analgesics:  none Past abortive triptans:  none Past abortive ergotamine:  none Past muscle relaxants:  none Past anti-emetic:  none Past antihypertensive medications:  none Past antidepressant medications:  none Past anticonvulsant medications:  none Past anti-CGRP:  none Other past therapies:  none  Current NSAIDS/analgesics:  ASA 81mg  daily Current triptans:  CONTRAINDICATED Current ergotamine:  CONTRAINDICATED  Current  anti-emetic:  none Current muscle relaxants:  none Current Antihypertensive medications:  metoprolol succinate 12.5mg  daily Current Antidepressant medications:  none Current Anticonvulsant medications:  none Current anti-CGRP:  none Other medications:  NTG, rosuvastatin    PAST MEDICAL HISTORY: Past Medical History:  Diagnosis Date   Allergy    Aortic atherosclerosis (HCC)    CAD (coronary artery disease)    s/p DES to mLAD 08/31/21   Cancer (HCC) 11/2019   s/p prostatectomy   Complication of anesthesia    took a long time to wear off    Diverticulosis    GI bleed    Gout    High cholesterol    Crestor   Superior mesenteric artery stenosis (HCC) 2024   severe    PAST SURGICAL HISTORY: Past Surgical History:  Procedure Laterality Date   ANKLE ARTHROSCOPY     "had it cleaned out"   CARDIAC CATHETERIZATION     COLONOSCOPY  10/14/2014   IR ANGIOGRAM VISCERAL SELECTIVE  09/28/2022   IR US GUIDE VASC ACCESS RIGHT  09/28/2022   KNEE ARTHROSCOPY  07/2019   KNEE ARTHROSCOPY  1980s   LEFT HEART CATH AND CORONARY ANGIOGRAPHY N/A 08/31/2021   Procedure: LEFT HEART CATH AND CORONARY ANGIOGRAPHY;  Surgeon: Kathleene Hazel, MD;  Location: [facility];  Service: Cardiovascular;  Laterality: N/A;   LYMPHADENECTOMY Bilateral 02/28/2020   Procedure: LYMPHADENECTOMY, PELVIC;  Surgeon: Heloise Purpura, MD;  Location: [facility];  Service: Urology;  Laterality: Bilateral;   ROBOT ASSISTED LAPAROSCOPIC RADICAL PROSTATECTOMY N/A 02/28/2020   Procedure: XI ROBOTIC ASSISTED LAPAROSCOPIC RADICAL PROSTATECTOMY LEVEL 2;  Surgeon: Heloise Purpura, MD;  Location:  [facility];  Service: Urology;  Laterality: N/A;    MEDICATIONS: Current Outpatient Medications on File Prior to Visit  Medication Sig Dispense Refill   allopurinol (ZYLOPRIM) 300 MG tablet Take 300 mg by mouth daily.     aspirin 81 MG chewable tablet Chew by mouth daily.     metoprolol succinate (TOPROL XL) 25 MG 24 hr tablet Take  0.5 tablets (12.5 mg total) by mouth daily. Take with a meal. 30 tablet 5   nitroGLYCERIN (NITROSTAT) 0.4 MG SL tablet Place 1 tablet (0.4 mg total) under the tongue every 5 (five) minutes x 3 doses as needed for chest pain. (Patient not taking: Reported on 12/15/2022) 25 tablet 12   rosuvastatin (CRESTOR) 40 MG tablet Take 1 tablet (40 mg total) by mouth at bedtime. 60 tablet 4   No current facility-administered medications on file prior to visit.    ALLERGIES: Allergies  Allergen Reactions   Colchicine Diarrhea and Nausea And Vomiting    FAMILY HISTORY: Family History  Problem Relation Age of Onset   Rheumatic fever Father 79       Died from complications of RF but might have been a heart attack   Colon cancer Neg Hx    Rectal cancer Neg Hx    Stomach cancer Neg Hx    Esophageal cancer Neg Hx     Objective:  Blood pressure 128/78, pulse (!) 58, height 6' (1.829 m), weight 208 lb (94.3 kg). General: No acute distress.  Patient appears well-groomed.   Head:  Normocephalic/atraumatic Eyes:  fundi examined but not visualized Neck: supple, no paraspinal tenderness, full range of motion Heart: regular rate and rhythm Neurological Exam: Mental status: alert and oriented to person, place, and time, speech fluent and not dysarthric, language intact. Cranial nerves: CN I: not tested CN II: pupils equal, round and reactive to light, visual fields intact CN III, IV, VI:  full range of motion, no nystagmus, no ptosis CN V: facial sensation intact. CN VII: upper and lower face symmetric CN VIII: hearing intact CN IX, X: gag intact, uvula midline CN XI: sternocleidomastoid and trapezius muscles intact CN XII: tongue midline Bulk & Tone: normal, no fasciculations. Motor:  muscle strength 5/5 throughout Sensation:  Pinprick and vibratory sensation intact. Deep Tendon Reflexes:  2+ throughout,  toes downgoing.   Finger to nose testing:  Without dysmetria.   Heel to shin:  Without  dysmetria.   Gait:  Normal station and stride.  Romberg negative.    Thank you for allowing me to take part in the care of this patient.  Shon Millet, DO  CC: Cleatis Polka, MD

## 2023-08-12 ENCOUNTER — Encounter: Payer: Self-pay | Admitting: Neurology

## 2023-08-12 ENCOUNTER — Ambulatory Visit (INDEPENDENT_AMBULATORY_CARE_PROVIDER_SITE_OTHER): Payer: No Typology Code available for payment source | Admitting: Neurology

## 2023-08-12 VITALS — BP 128/78 | HR 58 | Ht 72.0 in | Wt 208.0 lb

## 2023-08-12 DIAGNOSIS — G43019 Migraine without aura, intractable, without status migrainosus: Secondary | ICD-10-CM

## 2023-08-12 NOTE — Patient Instructions (Signed)
Take Nurtec once daily as needed for migraine.  Let me know if it works. Follow up 6 months.

## 2023-08-12 NOTE — Progress Notes (Signed)
Medication Samples have been provided to the patient.  Drug name: Nurtec       Strength: 75 mg        Qty: 3  LOT: [id number]  Exp.Date: 11/27  Dosing instructions: as needed  The patient has been instructed regarding the correct time, dose, and frequency of taking this medication, including desired effects and most common side effects.   Leida Lauth 3:26 PM 08/12/2023 Nurted

## 2023-08-31 LAB — LAB REPORT - SCANNED: EGFR: 75.7

## 2023-09-07 ENCOUNTER — Encounter: Payer: Self-pay | Admitting: Cardiovascular Disease

## 2023-09-08 NOTE — Telephone Encounter (Signed)
Got it. Will evaluate tomorrow. Thanks Jasmine December

## 2023-09-08 NOTE — Telephone Encounter (Signed)
Spoke to patient .  No Active chest discomfort at present.  Since NTG relieved discomfort.  Appt schedule for 09/09/23 at 2:45. Patient is aware to be here at 2:30

## 2023-09-09 ENCOUNTER — Encounter: Payer: Self-pay | Admitting: Physician Assistant

## 2023-09-09 ENCOUNTER — Ambulatory Visit: Payer: No Typology Code available for payment source | Attending: Physician Assistant | Admitting: Physician Assistant

## 2023-09-09 VITALS — BP 128/82 | HR 59 | Ht 72.0 in | Wt 203.6 lb

## 2023-09-09 DIAGNOSIS — E785 Hyperlipidemia, unspecified: Secondary | ICD-10-CM

## 2023-09-09 DIAGNOSIS — I25119 Atherosclerotic heart disease of native coronary artery with unspecified angina pectoris: Secondary | ICD-10-CM | POA: Diagnosis not present

## 2023-09-09 DIAGNOSIS — R079 Chest pain, unspecified: Secondary | ICD-10-CM | POA: Diagnosis not present

## 2023-09-09 DIAGNOSIS — I10 Essential (primary) hypertension: Secondary | ICD-10-CM | POA: Diagnosis not present

## 2023-09-09 NOTE — Progress Notes (Unsigned)
Cardiology Office Note:  .   Date:  09/11/2023  ID:  Kevin Wallace, DOB 1961-05-16, MRN [id number] PCP: Cleatis Polka., MD  [facility] Providers Cardiologist:  Nanetta Batty, MD     History of Present Illness: .   Kevin Wallace is a 63 y.o. male was past medical history of CAD, HTN, HLD and OSA.  Patient had NSTEMI in January 2023 underwent underwent cardiac catheterization by Dr. Clifton James revealing 99% mid LAD lesion after diagonal branch which was successfully stented with 2.5 x 53 mm Synergy DES.  He had normal EF.  Echocardiogram obtained on 09/01/2021 showed EF 50 to 55%, trivial MR.  He has sleep apnea however intolerant of CPAP therapy.  He previously had chest pain and syncope episode after taking 2 doses of nitroglycerin.  This was attributed to drop in the blood pressure from nitroglycerin.  Carotid Doppler obtained in December 2023 showed minimal plaque.  Heart monitor placed in December 2023 showed minimal heart rate 41 bpm, maximal heart rate of 188 bpm, average heart rate of 58 bpm.  1 run of ventricular tachycardia lasting only 4 beats with heart rate of 152 bpm.  14 episode of SVT, longest lasting 9 beats with average rate of 114 bpm.  SVT was detected within 45 seconds of symptomatic patient event.  Ventricular ectopy less than 1%.  This heart monitor report was reviewed by Dr. Allyson Sabal in January 2024 who referred the patient to EP service for further evaluation.  He was seen by Dr. Ladona Ridgel in February 2020 for who started him on 12.5 mg daily of metoprolol succinate.  Patient presents today for evaluation of chest pain.  He had 1 episode of chest pain yesterday that went away after 20 minutes and single nitroglycerin.  He has not had any further chest discomfort since.  He denies any recent exertional symptom.  He has no lower extremity edema, orthopnea or PND.  I recommended a PET stress test, if positive, we will consider a cardiac catheterization afterward.  I plan to  see the patient back in 6 weeks.  Risk and the benefit of the PET stress test has been explained to the patient, he is agreeable to proceed.  ROS:   Patient had an episode of chest pain yesterday, he denies any shortness of breath, no lower extremity edema, orthopnea or PND.  Studies Reviewed: Marland Kitchen   EKG Interpretation Date/Time:  Friday September 09 2023 14:35:51 EST Ventricular Rate:  59 PR Interval:  176 QRS Duration:  92 QT Interval:  400 QTC Calculation: 396 R Axis:   -33  Text Interpretation: Sinus bradycardia Poor R wave progression in the anterior leads. Confirmed by Azalee Course [phone]) on 09/11/2023 6:03:19 PM     Risk Assessment/Calculations:            Physical Exam:   VS:  BP 128/82 (BP Location: Left Arm, Patient Position: Sitting, Cuff Size: Large)   Pulse (!) 59   Ht 6' (1.829 m)   Wt 203 lb 9.6 oz (92.4 kg)   SpO2 93%   BMI 27.61 kg/m    Wt Readings from Last 3 Encounters:  09/09/23 203 lb 9.6 oz (92.4 kg)  08/12/23 208 lb (94.3 kg)  01/31/23 201 lb (91.2 kg)    GEN: Well nourished, well developed in no acute distress NECK: No JVD; No carotid bruits CARDIAC: RRR, no murmurs, rubs, gallops RESPIRATORY:  Clear to auscultation without rales, wheezing or rhonchi  ABDOMEN: Soft, non-tender, non-distended EXTREMITIES:  No edema; No deformity   ASSESSMENT AND PLAN: .    Chest pain: Solitary episode of chest pain yesterday, no recurrence since then.  No recent exertional symptom.  Will proceed with PET stress test.  If abnormal will proceed with cardiac catheterization.  Patient is aware to contact us if his chest pain become more associated with exertion or keep coming back.  Will have low threshold of consider cardiac cath if symptom Recurs prior to the stress test.  Coronary Artery Disease History of myocardial infarction in January 2023 with 99% mid LAD lesion stented. Recent episode of chest pain, similar but less severe than previous myocardial infarction. No  acute STT changes on EKG. -Order PET stress test to evaluate for recurrent blockage. -If symptoms worsen or become more frequent, proceed directly to cardiac catheterization.  Hypertension: Blood pressure stable  Hyperlipidemia Excellent cholesterol control on rosuvastatin 40mg  daily. -Continue rosuvastatin 40mg  daily.     Informed Consent   Shared Decision Making/Informed Consent The risks [chest pain, shortness of breath, cardiac arrhythmias, dizziness, blood pressure fluctuations, myocardial infarction, stroke/transient ischemic attack, nausea, vomiting, allergic reaction, radiation exposure, metallic taste sensation and life-threatening complications (estimated to be 1 in 10,000)], benefits (risk stratification, diagnosing coronary artery disease, treatment guidance) and alternatives of a cardiac PET stress test were discussed in detail with [name] and he agrees to proceed.     Dispo: Follow-up in 6 weeks.  Signed, Azalee Course, PA

## 2023-09-09 NOTE — Patient Instructions (Addendum)
Medication Instructions:  NO CHANGES *If you need a refill on your cardiac medications before your next appointment, please call your pharmacy*   Lab Work: NO LABS If you have labs (blood work) drawn today and your tests are completely normal, you will receive your results only by: MyChart Message (if you have MyChart) OR A paper copy in the mail If you have any lab test that is abnormal or we need to change your treatment, we will call you to review the results.   Testing/Procedures:    Please report to Radiology at the [facility] Main Entrance 30 minutes early for your test.  [address] [place], [place] [address]  How to Prepare for Your Cardiac PET/CT Stress Test:  Nothing to eat or drink, except water, 3 hours prior to arrival time.  NO caffeine/decaffeinated products, or chocolate 12 hours prior to arrival. (Please note decaffeinated beverages (teas/coffees) still contain caffeine).  If you have caffeine within 12 hours prior, the test will need to be rescheduled.  Medication instructions: Do not take erectile dysfunction medications for 72 hours prior to test (sildenafil, tadalafil) Do not take nitrates (isosorbide mononitrate, Ranexa) the day before or day of test Do not take tamsulosin the day before or morning of test Hold theophylline containing medications for 12 hours. Hold Dipyridamole 48 hours prior to the test.  Diabetic Preparation: If able to eat breakfast prior to 3 hour fasting, you may take all medications, including your insulin. Do not worry if you miss your breakfast dose of insulin - start at your next meal. If you do not eat prior to 3 hour fast-Hold all diabetes (oral and insulin) medications. Patients who wear a continuous glucose monitor MUST remove the device prior to scanning.  You may take your remaining medications with water.  NO perfume, cologne or lotion on chest or abdomen area. FEMALES - Please avoid wearing dresses to this  appointment.  Total time is 1 to 2 hours; you may want to bring reading material for the waiting time.  In preparation for your appointment, medication and supplies will be purchased.  Appointment availability is limited, so if you need to cancel or reschedule, please call the Radiology Department Scheduler at [phone] 24 hours in advance to avoid a cancellation fee of $100.00  What to Expect When you Arrive:  Once you arrive and check in for your appointment, you will be taken to a preparation room within the Radiology Department.  A technologist or Nurse will obtain your medical history, verify that you are correctly prepped for the exam, and explain the procedure.  Afterwards, an IV will be started in your arm and electrodes will be placed on your skin for EKG monitoring during the stress portion of the exam. Then you will be escorted to the PET/CT scanner.  There, staff will get you positioned on the scanner and obtain a blood pressure and EKG.  During the exam, you will continue to be connected to the EKG and blood pressure machines.  A small, safe amount of a radioactive tracer will be injected in your IV to obtain a series of pictures of your heart along with an injection of a stress agent.    After your Exam:  It is recommended that you eat a meal and drink a caffeinated beverage to counter act any effects of the stress agent.  Drink plenty of fluids for the remainder of the day and urinate frequently for the first couple of hours after  the exam.  Your doctor will inform you of your test results within 7-10 business days.  For more information and frequently asked questions, please visit our website: [email or web address]  For questions about your test or how to prepare for your test, please call: Cardiac Imaging Nurse Navigators Office: [phone]    Follow-Up: At [facility], you and your health needs are our priority.  As part of our continuing mission  to provide you with exceptional heart care, we have created designated Provider Care Teams.  These Care Teams include your primary Cardiologist (physician) and Advanced Practice Providers (APPs -  Physician Assistants and Nurse Practitioners) who all work together to provide you with the care you need, when you need it.   Your next appointment:   6 week(s)  Provider:   Azalee Course, PA     Other Instructions

## 2023-09-14 ENCOUNTER — Encounter: Payer: Self-pay | Admitting: Cardiovascular Disease

## 2023-09-21 NOTE — Telephone Encounter (Signed)
 HiHoney Lusty, can you follow up on why Mr. Boyajian' stress test is still unable to be scheduled?

## 2023-09-22 ENCOUNTER — Encounter (HOSPITAL_COMMUNITY): Payer: Self-pay

## 2023-09-29 ENCOUNTER — Telehealth: Payer: Self-pay | Admitting: Cardiovascular Disease

## 2023-09-29 NOTE — Telephone Encounter (Signed)
Tracey with [employer] called about pt's upcoming PET scan. Pt wants to know the CPT code and if it requires prior auth. Please advise.

## 2023-09-30 NOTE — Telephone Encounter (Signed)
The cost of PET stress will be the same irregardless of [facility] or [facility], right? It appears out of pocket is too high for Kevin Wallace. Would spect myoview be less costly for him?

## 2023-10-07 ENCOUNTER — Other Ambulatory Visit: Payer: Self-pay | Admitting: Internal Medicine

## 2023-10-07 ENCOUNTER — Other Ambulatory Visit: Payer: Self-pay

## 2023-10-07 NOTE — Telephone Encounter (Signed)
[facility] pharmacy  is stating that pt's medication metoprolol succinate 25 mg extended release cannot be cut. Please address

## 2023-10-10 MED ORDER — METOPROLOL SUCCINATE ER 25 MG PO TB24: 12.5000 mg | ORAL_TABLET | Freq: Every day | ORAL | 3 refills | Status: DC

## 2023-10-10 NOTE — Telephone Encounter (Signed)
 No evidence found that metoprolol succinate can't be cut in half to make dosage of 12.5mg . Will send back to pharmacy.

## 2023-10-21 ENCOUNTER — Ambulatory Visit: Payer: No Typology Code available for payment source | Admitting: Physician Assistant

## 2023-10-24 ENCOUNTER — Ambulatory Visit: Payer: No Typology Code available for payment source | Admitting: Neurology

## 2023-10-29 ENCOUNTER — Encounter: Payer: Self-pay | Admitting: Cardiovascular Disease

## 2023-11-11 ENCOUNTER — Encounter (HOSPITAL_COMMUNITY): Payer: Self-pay

## 2023-11-16 ENCOUNTER — Encounter (HOSPITAL_COMMUNITY)
Admission: RE | Admit: 2023-11-16 | Discharge: 2023-11-16 | Disposition: A | Discharge status: Home / Self Care | Payer: No Typology Code available for payment source | Source: Ambulatory Visit | Attending: Physician Assistant | Admitting: Physician Assistant

## 2023-11-16 DIAGNOSIS — R079 Chest pain, unspecified: Secondary | ICD-10-CM | POA: Insufficient documentation

## 2023-11-16 LAB — NM PET CT CARDIAC PERFUSION MULTI W/ABSOLUTE BLOODFLOW
LV dias vol: 124 mL (ref 62–150)
LV sys vol: 65 mL
MBFR: 3.28
Nuc Rest EF: 48 %
Nuc Stress EF: 53 %
Rest MBF: 0.64 ml/g/min
Rest Nuclear Isotope Dose: 24 mCi
ST Depression (mm): 0 mm
Stress MBF: 2.1 ml/g/min
Stress Nuclear Isotope Dose: 23.7 mCi

## 2023-11-16 MED ORDER — REGADENOSON 0.4 MG/5ML IV SOLN
INTRAVENOUS | Status: AC
  Filled 2023-11-16: qty 5

## 2023-11-16 MED ORDER — REGADENOSON 0.4 MG/5ML IV SOLN
0.4000 mg | Freq: Once | INTRAVENOUS | Status: AC
  Administered 2023-11-16: 0.4 mg via INTRAVENOUS

## 2023-11-16 MED ORDER — RUBIDIUM RB82 GENERATOR (RUBYFILL): 24.0000 | PACK | Freq: Once | INTRAVENOUS | Status: DC

## 2023-11-16 MED ORDER — RUBIDIUM RB82 GENERATOR (RUBYFILL)
23.7000 | PACK | Freq: Once | INTRAVENOUS | Status: AC
  Administered 2023-11-16: 23.7 via INTRAVENOUS

## 2023-11-17 NOTE — Telephone Encounter (Signed)
I have called the patient, all questions answered

## 2023-11-29 ENCOUNTER — Ambulatory Visit: Admitting: Physician Assistant

## 2023-11-30 ENCOUNTER — Ambulatory Visit: Attending: Physician Assistant | Admitting: Physician Assistant

## 2023-11-30 ENCOUNTER — Encounter: Payer: Self-pay | Admitting: Physician Assistant

## 2023-11-30 VITALS — BP 128/70 | HR 66 | Ht 72.0 in | Wt 206.0 lb

## 2023-11-30 DIAGNOSIS — I2510 Atherosclerotic heart disease of native coronary artery without angina pectoris: Secondary | ICD-10-CM

## 2023-11-30 DIAGNOSIS — E785 Hyperlipidemia, unspecified: Secondary | ICD-10-CM

## 2023-11-30 DIAGNOSIS — I10 Essential (primary) hypertension: Secondary | ICD-10-CM | POA: Diagnosis not present

## 2023-11-30 NOTE — Patient Instructions (Signed)
 Medication Instructions:  NO CHANGES *If you need a refill on your cardiac medications before your next appointment, please call your pharmacy*  Lab Work: NO LABS If you have labs (blood work) drawn today and your tests are completely normal, you will receive your results only by: MyChart Message (if you have MyChart) OR A paper copy in the mail If you have any lab test that is abnormal or we need to change your treatment, we will call you to review the results.  Testing/Procedures: NO TESTING  Follow-Up: At [facility], you and your health needs are our priority.  As part of our continuing mission to provide you with exceptional heart care, our providers are all part of one team.  This team includes your primary Cardiologist (physician) and Advanced Practice Providers or APPs (Physician Assistants and Nurse Practitioners) who all work together to provide you with the care you need, when you need it.  Your next appointment:   6 month(s)  Provider:   Lauro Portal, MD   Other Instructions   1st Floor: - Lobby - Registration  - Pharmacy  - Lab - Cafe  2nd Floor: - PV Lab - Diagnostic Testing (echo, CT, nuclear med)  3rd Floor: - Vacant  4th Floor: - TCTS (cardiothoracic surgery) - AFib Clinic - Structural Heart Clinic - Vascular Surgery  - Vascular Ultrasound  5th Floor: - HeartCare Cardiology (general and EP) - Clinical Pharmacy for coumadin, hypertension, lipid, weight-loss medications, and med management appointments    Valet parking services will be available as well.

## 2023-11-30 NOTE — Progress Notes (Signed)
 Cardiology Office Note:  .   Date:  11/30/2023  ID:  Sheliah Wallace, DOB 03/07/1961, MRN [id number] PCP: Cleatis Polka., MD  [facility] Providers Cardiologist:  Nanetta Batty, MD     History of Present Illness: .   Kevin Wallace is a 63 y.o. male with past medical history of CAD, HTN, HLD and OSA.  Patient had NSTEMI in January 2023 underwent underwent cardiac catheterization by Dr. Clifton James revealing 99% mid LAD lesion after diagonal branch which was successfully stented with 2.5 x 53 mm Synergy DES.  He had normal EF.  Echocardiogram obtained on 09/01/2021 showed EF 50 to 55%, trivial MR.  He has sleep apnea however intolerant of CPAP therapy.  He previously had chest pain and syncope episode after taking 2 doses of nitroglycerin.  This was attributed to drop in the blood pressure from nitroglycerin.  Carotid Doppler obtained in December 2023 showed minimal plaque.  Heart monitor placed in December 2023 showed minimal heart rate 41 bpm, maximal heart rate of 188 bpm, average heart rate of 58 bpm.  1 run of ventricular tachycardia lasting only 4 beats with heart rate of 152 bpm.  14 episodes of SVT, longest lasting 9 beats with average rate of 114 bpm.  SVT was detected within 45 seconds of symptomatic patient event.  Ventricular ectopy less than 1%.  This heart monitor report was reviewed by Dr. Allyson Sabal in January 2024 who referred the patient to EP service for further evaluation.  He was seen by Dr. Ladona Ridgel in February 2024 who started him on 12.5 mg daily of metoprolol succinate.   I last saw the patient on 09/09/2023 for evaluation of chest pain.  I recommended a PET stress test which came back on 11/16/2023 showing resting EF 48%, no ischemia or infarction, overall low risk study.  No acute extracardiac finding.  I discussed the case with the patient and recommended echocardiogram as EF was low on the PET stress test.  Patient presents today for follow-up.  He has no lower extremity edema,  orthopnea or PND.  He still gets fatigued but no exertional chest pain or shortness of breath.  He was able to mow his yard for 3 hours without any exertional symptom.  Overall, he has been doing well, I do not recommend any further ischemic workup at this time.  He can follow-up with Dr. Allyson Sabal in 6 months.  He appears to be euvolemic on exam.  ROS:   He denies chest pain, palpitations, dyspnea, pnd, orthopnea, n, v, dizziness, syncope, edema, weight gain, or early satiety. All other systems reviewed and are otherwise negative except as noted above.    Studies Reviewed: .        Cardiac Studies & Procedures   ______________________________________________________________________________________________ CARDIAC CATHETERIZATION  CARDIAC CATHETERIZATION 08/31/2021  Conclusion   Prox RCA lesion is 20% stenosed.   Mid RCA lesion is 20% stenosed.   Ost Cx to Prox Cx lesion is 30% stenosed.   1st Mrg lesion is 30% stenosed.   2nd Diag lesion is 50% stenosed.   Mid LAD lesion is 99% stenosed.   A drug-eluting stent was successfully placed using a SYNERGY XD 2.50X32.   Post intervention, there is a 0% residual stenosis.   The left ventricular systolic function is normal.   LV end diastolic pressure is normal.   The left ventricular ejection fraction is 50-55% by visual estimate.   There is no mitral valve regurgitation.  Severe mid LAD stenosis just  beyond a moderate caliber diagonal branch. The diagonal branch has moderate ostial stenosis. Successful PTCA/DES x 1 mid LAD Mild proximal Circumflex stenosis Large dominant RCA with mild proximal and mild distal stenosis Preserved LV systolic function with hypokinesis of the apex.  Recommendations: Will continue DAPT with ASA and Brilinta for one year. Continue statin. No beta blocker currently secondary to bradycardia. Echo in the am.  Findings Coronary Findings Diagnostic  Dominance: Right  Left Anterior Descending Vessel is  large. Mid LAD lesion is 99% stenosed.  Second Diagonal Branch Vessel is moderate in size. 2nd Diag lesion is 50% stenosed.  Left Circumflex Ost Cx to Prox Cx lesion is 30% stenosed.  First Obtuse Marginal Branch 1st Mrg lesion is 30% stenosed.  Right Coronary Artery Vessel is large. Prox RCA lesion is 20% stenosed. Mid RCA lesion is 20% stenosed.  Intervention  Mid LAD lesion Stent CATH VISTA GUIDE 6FR XBLAD3.5 guide catheter was inserted. Lesion crossed with guidewire using a WIRE COUGAR XT STRL 190CM. Pre-stent angioplasty was performed using a BALLN SAPPHIRE 2.0X15. A drug-eluting stent was successfully placed using a SYNERGY XD 2.50X32. Stent strut is well apposed. Post-stent angioplasty was performed using a BALL SAPPHIRE NC24 2.5X22. Post-Intervention Lesion Assessment The intervention was successful. Pre-interventional TIMI flow is 1. Post-intervention TIMI flow is 3. No complications occurred at this lesion. There is a 0% residual stenosis post intervention.   STRESS TESTS  NM PET CT CARDIAC PERFUSION MULTI W/ABSOLUTE BLOODFLOW 11/16/2023  Narrative   Rest left ventricular function is abnormal. Rest global function is mildly reduced. Rest EF: 48%. Stress left ventricular function is normal. Stress EF: 53%. End diastolic cavity size is normal. End systolic cavity size is normal.   Myocardial blood flow was computed to be 0.26ml/g/min at rest and 2.47ml/g/min at stress. Global myocardial blood flow reserve was 3.28 and was normal.   Coronary calcium assessment not performed due to prior revascularization. Not commented on due to prior LAD stent   Findings are consistent with no ischemia and no infarction. The study is low risk.   Normal perfusion, stress EF and myocardial blood flow reserve. Resting EF quantitates mildly reduced suggest TTE correlation Calcium not commented on due to prior LAD stent  CLINICAL DATA:  This over-read does not include interpretation  of cardiac or coronary anatomy or pathology. The interpretation by the cardiologist is attached.  COMPARISON:  None Available.  FINDINGS: Scout view is grossly unremarkable.  No acute extracardiac findings.  IMPRESSION: No acute extracardiac findings.   Electronically Signed By: Leanna Battles M.D. On: 11/16/2023 11:02   ECHOCARDIOGRAM  ECHOCARDIOGRAM COMPLETE 09/01/2021  Narrative ECHOCARDIOGRAM REPORT    Patient Name:   Kevin Wallace Date of Exam: 09/01/2021 Medical Rec #:  [id number]      Height:       72.0 in Accession #:    [id number]     Weight:       199.2 lb Date of Birth:  [birth year]     BSA:          2.127 m Patient Age:    60 years       BP:           132/71 mmHg Patient Gender: M              HR:           60 bpm. Exam Location:  Inpatient  Procedure: 2D Echo, Cardiac Doppler, Color Doppler and Intracardiac Opacification Agent  Indications:  NSTEMI  History:        Patient has no prior history of Echocardiogram examinations. Signs/Symptoms:Chest Pain; Risk Factors:Hypertension.  Sonographer:    Pamelia Boehringer Referring Phys: [id number] Annia Kilts BHAGAT  IMPRESSIONS   1. Left ventricular ejection fraction, by estimation, is 50 to 55%. The left ventricle has low normal function. The left ventricle demonstrates regional wall motion abnormalities (see scoring diagram/findings for description). Left ventricular diastolic parameters were normal. 2. Right ventricular systolic function is normal. The right ventricular size is normal. Tricuspid regurgitation signal is inadequate for assessing PA pressure. 3. The mitral valve is grossly normal. Trivial mitral valve regurgitation. No evidence of mitral stenosis. 4. The aortic valve is tricuspid. There is mild calcification of the aortic valve. Aortic valve regurgitation is not visualized. Aortic valve sclerosis is present, with no evidence of aortic valve stenosis. 5. The inferior vena cava is normal in size with  greater than 50% respiratory variability, suggesting right atrial pressure of 3 mmHg.  FINDINGS Left Ventricle: Left ventricular ejection fraction, by estimation, is 50 to 55%. The left ventricle has low normal function. The left ventricle demonstrates regional wall motion abnormalities. The left ventricular internal cavity size was normal in size. There is no left ventricular hypertrophy. Left ventricular diastolic parameters were normal.   LV Wall Scoring: The apical septal segment and apex are hypokinetic.  Right Ventricle: The right ventricular size is normal. No increase in right ventricular wall thickness. Right ventricular systolic function is normal. Tricuspid regurgitation signal is inadequate for assessing PA pressure.  Left Atrium: Left atrial size was normal in size.  Right Atrium: Right atrial size was normal in size.  Pericardium: There is no evidence of pericardial effusion. Presence of epicardial fat layer.  Mitral Valve: The mitral valve is grossly normal. Trivial mitral valve regurgitation. No evidence of mitral valve stenosis.  Tricuspid Valve: The tricuspid valve is grossly normal. Tricuspid valve regurgitation is trivial. No evidence of tricuspid stenosis.  Aortic Valve: The aortic valve is tricuspid. There is mild calcification of the aortic valve. Aortic valve regurgitation is not visualized. Aortic valve sclerosis is present, with no evidence of aortic valve stenosis. Aortic valve peak gradient measures 5.5 mmHg.  Pulmonic Valve: The pulmonic valve was grossly normal. Pulmonic valve regurgitation is not visualized. No evidence of pulmonic stenosis.  Aorta: The aortic root and ascending aorta are structurally normal, with no evidence of dilitation.  Venous: The inferior vena cava is normal in size with greater than 50% respiratory variability, suggesting right atrial pressure of 3 mmHg.  IAS/Shunts: The atrial septum is grossly normal.   LEFT VENTRICLE PLAX  2D LVIDd:         5.20 cm      Diastology LVIDs:         3.50 cm      LV e' medial:    7.83 cm/s LV PW:         1.00 cm      LV E/e' medial:  5.8 LV IVS:        1.30 cm      LV e' lateral:   10.40 cm/s LVOT diam:     2.00 cm      LV E/e' lateral: 4.4 LV SV:         69 LV SV Index:   32 LVOT Area:     3.14 cm  LV Volumes (MOD) LV vol d, MOD A2C: 115.0 ml LV vol d, MOD A4C: 158.0 ml LV vol s, MOD  A2C: 57.8 ml LV vol s, MOD A4C: 76.7 ml LV SV MOD A2C:     57.2 ml LV SV MOD A4C:     158.0 ml LV SV MOD BP:      71.3 ml  RIGHT VENTRICLE             IVC RV Basal diam:  4.10 cm     IVC diam: 1.40 cm RV Mid diam:    3.90 cm RV S prime:     10.70 cm/s TAPSE (M-mode): 1.9 cm  LEFT ATRIUM             Index        RIGHT ATRIUM           Index LA diam:        4.00 cm 1.88 cm/m   RA Area:     17.00 cm LA Vol (A2C):   50.2 ml 23.60 ml/m  RA Volume:   46.70 ml  21.96 ml/m LA Vol (A4C):   40.8 ml 19.18 ml/m LA Biplane Vol: 45.9 ml 21.58 ml/m AORTIC VALVE AV Area (Vmax): 2.74 cm AV Vmax:        117.00 cm/s AV Peak Grad:   5.5 mmHg LVOT Vmax:      102.00 cm/s LVOT Vmean:     69.900 cm/s LVOT VTI:       0.220 m  AORTA Ao Root diam: 3.20 cm Ao Asc diam:  3.40 cm  MITRAL VALVE MV Area (PHT): 2.95 cm    SHUNTS MV Decel Time: 257 msec    Systemic VTI:  0.22 m MV E velocity: 45.40 cm/s  Systemic Diam: 2.00 cm MV A velocity: 64.30 cm/s MV E/A ratio:  0.71  Lennie Odor MD Electronically signed by Lennie Odor MD Signature Date/Time: 09/01/2021/12:27:22 PM    Final    MONITORS  LONG TERM MONITOR-LIVE TELEMETRY (3-14 DAYS) 07/28/2022  Narrative Patch Wear Time:  13 days and 17 hours (2023-11-20T12:29:04-499 to 2023-12-04T06:09:53-0500)  Patient had a min HR of 41 bpm, max HR of 188 bpm, and avg HR of 58 bpm. Predominant underlying rhythm was Sinus Rhythm. 1 run of Ventricular Tachycardia occurred lasting 4 beats with a max rate of 152 bpm (avg 151 bpm). 14  Supraventricular Tachycardia runs occurred, the run with the fastest interval lasting 5 beats with a max rate of 188 bpm, the longest lasting 9 beats with an avg rate of 114 bpm. Supraventricular Tachycardia was detected within +/- 45 seconds of symptomatic patient event(s). True duration of Supraventricular Tachycardia difficult to ascertain due to artifact. Isolated SVEs were rare (<1.0%), SVE Couplets were rare (<1.0%), and SVE Triplets were rare (<1.0%). Isolated VEs were rare (<1.0%), VE Couplets were rare (<1.0%), and no VE Triplets were present. Ventricular Bigeminy was present.  SR/SB/St Occasional PACs/PVCs Short runs of NSVT/PSVT   CT SCANS  CT CARDIAC SCORING (SELF PAY ONLY) 09/05/2020  Narrative CLINICAL DATA:  63 year old white male with history of high cholesterol.  EXAM: CT CARDIAC CORONARY ARTERY CALCIUM SCORE  TECHNIQUE: Non-contrast imaging through the heart was performed using prospective ECG gating. Image post processing was performed on an independent workstation, allowing for quantitative analysis of the heart and coronary arteries. Note that this exam targets the heart and the chest was not imaged in its entirety.  COMPARISON:  June 26, 2015.  FINDINGS: CORONARY CALCIUM SCORES:  Left Main: 0  LAD: 5.93  LCx: 0  RCA: 0  Total Agatston Score: 5.93  MESA database percentile:  38  AORTA MEASUREMENTS:  Ascending Aorta: 37 mm  Descending Aorta: 26 mm  OTHER FINDINGS:  Cardiovascular: Signs of atherosclerotic change in the thoracic aorta. Central pulmonary vasculature is normal caliber. Heart size normal without pericardial effusion.  Mediastinum/Nodes: No adenopathy in the chest. Limited visualization including only the mid chest on today's study. Esophagus in visualized portions is grossly normal.  Lungs/Pleura: Visualized portions of the lungs are clear. Airways are patent.  Upper Abdomen: Incidental imaging of upper abdominal  contents shows no acute process.  Musculoskeletal: No chest wall lesion in the visualized chest. No acute bone finding or destructive bone process.  IMPRESSION: 1. Total Agatston calcium score of 5.93, which places the patient in the 38th percentile for patients of similar age, gender and race/ethnicity. 2. Aortic atherosclerosis. 3. Ascending aorta approximately 3.7 cm maximal caliber, grossly similar to the previous study where there was some motion artifact however with respect to measurements. Could consider the following in this 63 year old male. Recommend annual imaging followup by CTA or MRA. This recommendation follows 2010 ACCF/AHA/AATS/ACR/ASA/SCA/SCAI/SIR/STS/SVM Guidelines for the Diagnosis and Management of Patients with Thoracic Aortic Disease. Circulation.2010; 121: Q469-G295. Aortic aneurysm NOS (ICD10-I71.9)  Aortic Atherosclerosis (ICD10-I70.0).   Electronically Signed By: Donzetta Kohut M.D. On: 09/05/2020 15:07   CT SCANS  CT CARDIAC SCORING (SELF PAY ONLY) 06/26/2015  Addendum 06/26/2015  9:26 AM ADDENDUM REPORT: 06/26/2015 09:24 ADDENDUM: Clinical data: Personal history of chest pain. Father with heart failure. FINDINGS: Mediastinum: The visualized portions of the lower airway and esophagus appear normal no adenopathy identified Lungs/Pleura: No pleural fluid identified. Upper Abdomen: The visualized portions of the liver and spleen are unremarkable. Musculoskeletal: Negative. IMPRESSION: 1. Unremarkable over read. Electronically Signed By: Signa Kell M.D. On: 06/26/2015 09:24  Narrative : Risk stratification  EXAM: Coronary Calcium Score  TECHNIQUE: The patient was scanned on a Siemens Sensation 16 slice scanner. Axial non-contrast 3mm slices were carried out through the heart. The data set was analyzed on a dedicated work station and scored using the Agatson method.  FINDINGS: Non-cardiac: No significant non cardiac findings  on limited lung and soft tissue windows. See separate report from [facility].  Ascending Aorta:  3.3 cm  Pericardium: Normal  Coronary arteries:  No coronary artery calcium detected  IMPRESSION: Coronary calcium score of 0.  Charlton Haws  Electronically Signed: By: Charlton Haws M.D. On: 06/26/2015 08:30     ______________________________________________________________________________________________      Risk Assessment/Calculations:             Physical Exam:   VS:  BP 128/70 (BP Location: Right Arm, Patient Position: Sitting, Cuff Size: Normal)   Pulse 66   Ht 6' (1.829 m)   Wt 206 lb (93.4 kg)   SpO2 95%   BMI 27.94 kg/m    Wt Readings from Last 3 Encounters:  11/30/23 206 lb (93.4 kg)  09/09/23 203 lb 9.6 oz (92.4 kg)  08/12/23 208 lb (94.3 kg)    GEN: Well nourished, well developed in no acute distress NECK: No JVD; No carotid bruits CARDIAC: RRR, no murmurs, rubs, gallops RESPIRATORY:  Clear to auscultation without rales, wheezing or rhonchi  ABDOMEN: Soft, non-tender, non-distended EXTREMITIES:  No edema; No deformity   ASSESSMENT AND PLAN: .    CAD: He recently had some chest discomfort, subsequent PET stress test came back negative for ischemia or infarction.  Chest pain has resolved.  No further ischemic workup  Hypertension: Blood pressure well-controlled  Hyperlipidemia: Continue rosuvastatin 40 mg  daily.       Dispo: Follow-up with Dr. Katheryne Pane in 6 months  Signed, Elly Haffey, [place]

## 2024-02-13 ENCOUNTER — Ambulatory Visit: Payer: Self-pay | Admitting: Neurology

## 2024-08-25 ENCOUNTER — Encounter: Payer: Self-pay | Admitting: Cardiovascular Disease

## 2024-09-03 ENCOUNTER — Encounter: Payer: Self-pay | Admitting: Cardiovascular Disease

## 2024-09-03 ENCOUNTER — Ambulatory Visit: Attending: Cardiovascular Disease | Admitting: Cardiovascular Disease

## 2024-09-03 VITALS — BP 116/76 | HR 47 | Ht 72.0 in | Wt 201.0 lb

## 2024-09-03 DIAGNOSIS — E782 Mixed hyperlipidemia: Secondary | ICD-10-CM

## 2024-09-03 DIAGNOSIS — I2510 Atherosclerotic heart disease of native coronary artery without angina pectoris: Secondary | ICD-10-CM

## 2024-09-03 DIAGNOSIS — G4733 Obstructive sleep apnea (adult) (pediatric): Secondary | ICD-10-CM | POA: Diagnosis not present

## 2024-09-03 DIAGNOSIS — E785 Hyperlipidemia, unspecified: Secondary | ICD-10-CM | POA: Diagnosis not present

## 2024-09-03 DIAGNOSIS — K551 Chronic vascular disorders of intestine: Secondary | ICD-10-CM | POA: Diagnosis not present

## 2024-09-03 DIAGNOSIS — I10 Essential (primary) hypertension: Secondary | ICD-10-CM | POA: Diagnosis not present

## 2024-09-03 DIAGNOSIS — R079 Chest pain, unspecified: Secondary | ICD-10-CM

## 2024-09-03 DIAGNOSIS — I471 Supraventricular tachycardia, unspecified: Secondary | ICD-10-CM | POA: Diagnosis not present

## 2024-09-03 NOTE — Assessment & Plan Note (Addendum)
 History of CAD status post non-STEMI 08/30/2021 with Cardi catheterization the following day performed Dr. Verlin revealing a 99% mid LAD lesion after diagonal branch which was stented with a 2.5 mm x 33 mm long Synergy drug-eluting stent with excellent result.  He did have a 50% ostial diagonal branch stenosis with no other obstructive disease.  He gets occasional mild chest pain every several months which was evaluated by Scot Ford  PA-C who obtained a cardiac PET study 09/05/2023 that revealed normal perfusion.

## 2024-09-03 NOTE — Assessment & Plan Note (Signed)
 History of 50% SMA stenosis without symptoms followed by Dr. Serene.

## 2024-09-03 NOTE — Assessment & Plan Note (Signed)
 History of essential hypertension blood pressure measured today at 116/76.  He is on low-dose metoprolol .

## 2024-09-03 NOTE — Assessment & Plan Note (Signed)
 History of SVT and NSVT in the past found on event monitoring after an episode of syncope.  He did see Dr. Waddell who began him on a low-dose beta-blocker.  He has had no recurrent episodes.

## 2024-09-03 NOTE — Progress Notes (Signed)
 "     09/03/2024 Kevin Wallace   [birth year]  [id number]  Primary Physician Loreli Elsie JONETTA Mickey., MD Primary Cardiologist: Dorn JINNY Lesches MD [facility] [place], [place]  HPI:  Kevin Wallace is a 64 y.o.   mildly overweight but fit appearing married Caucasian male father of 2 children who works as a paramedic (desk job).  I last saw him in the office 09/01/2022.  He is accompanied by his wife Kevin Wallace today.  His cardiac risk factors notable for just hyperlipidemia.  There is no family history for heart disease.  He had a non-STEMI on 08/30/2021 and underwent cardiac catheterization the following day by Dr. Verlin revealing a 99% mid LAD lesion after a diagonal branch which was successfully stented using a 2.5 mm x 33 mm long [employer] Synergy drug-eluting stent with an excellent result.  He had scattered nonobstructive disease otherwise with preserved ejection fraction.  He was discharged home the following day on aspirin  and Brilinta .  He has had 1 nosebleed but none since.  He complains of easy bruising.  He apparently also had a recent home sleep study which was positive.  Interestingly, he did have a coronary calcium  score performed by his PCP 09/05/2020 which was low (6) with a small amount of calcium  in the LAD territory.  He is fairly active and plays golf and walks.   He did have an episode of chest pain and syncope possibly related taking nitroglycerin .  He did wear an event monitor which showed short runs of NSVT and PSVT several which were associated with symptoms.  He also had an admission with a GI bleed at which time his Brilinta  was transitioned to clopidogrel .  He has sleep apnea followed by Dr. Burnard but he is intolerant to CPAP.  Since I saw him 2 years ago he did see Hao Meng  PA-C with chest pain.  He had a cardiac PET study performed 09/05/2023 which was low risk and nonischemic.  He gets rare chest pain during swimming every several months which have not changed in  frequency or severity.   Active Medications[1]   Allergies[2]  Social History   Socioeconomic History   Marital status: Married    Spouse name: Kevin Wallace   Number of children: 2   Years of education: 18   Highest education level: Master's degree (e.g., MA, MS, MEng, MEd, MSW, MBA)  Occupational History   Occupation: desk work as a production designer, theatre/television/film  Tobacco Use   Smoking status: Never   Smokeless tobacco: Never  Vaping Use   Vaping status: Never Used  Substance and Sexual Activity   Alcohol use: Not Currently   Drug use: No   Sexual activity: Yes    Birth control/protection: None  Other Topics Concern   Not on file  Social History Narrative   Lives with wife.     Left handed   Social Drivers of Health   Tobacco Use: Low Risk (09/03/2024)   Patient History    Smoking Tobacco Use: Never    Smokeless Tobacco Use: Never    Passive Exposure: Not on file  Financial Resource Strain: Not on file  Food Insecurity: No Food Insecurity (04/20/2022)   Hunger Vital Sign    Worried About Running Out of Food in the Last Year: Never true    Ran Out of Food in the Last Year: Never true  Transportation Needs: No Transportation Needs (04/20/2022)   PRAPARE - Administrator, Civil Service (Medical): No  Lack of Transportation (Non-Medical): No  Physical Activity: Not on file  Stress: Not on file  Social Connections: Unknown (12/29/2021)   Received from [facility]   Social Network    Social Network: Not on file  Intimate Partner Violence: Not At Risk (04/20/2022)   Humiliation, Afraid, Rape, and Kick questionnaire    Fear of Current or Ex-Partner: No    Emotionally Abused: No    Physically Abused: No    Sexually Abused: No  Depression (PHQ2-9): Low Risk (12/23/2021)   Depression (PHQ2-9)    PHQ-2 Score: 0  Alcohol Screen: Not on file  Housing: Low Risk (04/20/2022)   Housing    Last Housing Risk Score: 0  Utilities: Not At Risk (04/20/2022)   AHC Utilities    Threatened with loss  of utilities: No  Health Literacy: Not on file     Review of Systems: General: negative for chills, fever, night sweats or weight changes.  Cardiovascular: negative for chest pain, dyspnea on exertion, edema, orthopnea, palpitations, paroxysmal nocturnal dyspnea or shortness of breath Dermatological: negative for rash Respiratory: negative for cough or wheezing Urologic: negative for hematuria Abdominal: negative for nausea, vomiting, diarrhea, bright red blood per rectum, melena, or hematemesis Neurologic: negative for visual changes, syncope, or dizziness All other systems reviewed and are otherwise negative except as noted above.    Blood pressure 116/76, pulse (!) 47, height 6' (1.829 m), weight 201 lb (91.2 kg), SpO2 94%.  General appearance: alert and no distress Neck: no adenopathy, no carotid bruit, no JVD, supple, symmetrical, trachea midline, and thyroid  not enlarged, symmetric, no tenderness/mass/nodules Lungs: clear to auscultation bilaterally Heart: regular rate and rhythm, S1, S2 normal, no murmur, click, rub or gallop Extremities: extremities normal, atraumatic, no cyanosis or edema Pulses: 2+ and symmetric Skin: Skin color, texture, turgor normal. No rashes or lesions Neurologic: Grossly normal  EKG EKG Interpretation Date/Time:  Monday September 03 2024 14:13:47 EST Ventricular Rate:  47 PR Interval:  172 QRS Duration:  98 QT Interval:  472 QTC Calculation: 417 R Axis:   -13  Text Interpretation: Sinus bradycardia When compared with ECG of 09-Sep-2023 14:35, Nonspecific T wave abnormality no longer evident in Anterolateral leads Confirmed by Court Carrier [phone]) on 09/03/2024 2:25:11 PM    ASSESSMENT AND PLAN:   Coronary artery disease History of CAD status post non-STEMI 08/30/2021 with Cardi catheterization the following day performed Dr. Verlin revealing a 99% mid LAD lesion after diagonal branch which was stented with a 2.5 mm x 33 mm long Synergy  drug-eluting stent with excellent result.  He did have a 50% ostial diagonal branch stenosis with no other obstructive disease.  He gets occasional mild chest pain every several months which was evaluated by Scot Ford  PA-C who obtained a cardiac PET study 09/05/2023 that revealed normal perfusion.  HTN (hypertension) History of essential hypertension blood pressure measured today at 116/76.  He is on low-dose metoprolol .  HLD (hyperlipidemia) History of hyperlipidemia on high-dose statin therapy with lipid profile performed 08/31/2023 revealing total cholesterol 101, LDL 38 and HDL 34.  Obstructive sleep apnea History of obstructive sleep apnea intolerant to CPAP and not a candidate for the inspire device.  Superior mesenteric artery stenosis History of 50% SMA stenosis without symptoms followed by Dr. Serene.  SVT (supraventricular tachycardia) History of SVT and NSVT in the past found on event monitoring after an episode of syncope.  He did see Dr. Waddell who began him on a low-dose beta-blocker.  He  has had no recurrent episodes.     Dorn DOROTHA Lesches MD FACP,FACC,FAHA, FSCAI 09/03/2024 2:39 PM    [1]  Current Meds  Medication Sig   allopurinol  (ZYLOPRIM ) 300 MG tablet Take 300 mg by mouth daily.   aspirin  81 MG chewable tablet Chew by mouth daily.   metoprolol  succinate (TOPROL -XL) 25 MG 24 hr tablet Take 0.5 tablets (12.5 mg total) by mouth daily. Take with a meal.   nitroGLYCERIN  (NITROSTAT ) 0.4 MG SL tablet Place 1 tablet (0.4 mg total) under the tongue every 5 (five) minutes x 3 doses as needed for chest pain.   rosuvastatin  (CRESTOR ) 40 MG tablet Take 1 tablet (40 mg total) by mouth at bedtime.  [2]  Allergies Allergen Reactions   Colchicine Diarrhea and Nausea And Vomiting   "

## 2024-09-03 NOTE — Assessment & Plan Note (Signed)
 History of obstructive sleep apnea intolerant to CPAP and not a candidate for the inspire device.

## 2024-09-03 NOTE — Patient Instructions (Signed)
 Medication Instructions:  Your physician recommends that you continue on your current medications as directed. Please refer to the Current Medication list given to you today.  *If you need a refill on your cardiac medications before your next appointment, please call your pharmacy*   Follow-Up: At [facility], you and your health needs are our priority.  As part of our continuing mission to provide you with exceptional heart care, our providers are all part of one team.  This team includes your primary Cardiologist (physician) and Advanced Practice Providers or APPs (Physician Assistants and Nurse Practitioners) who all work together to provide you with the care you need, when you need it.  Your next appointment:   6 month(s)  Provider:   Hao Meng, PA-C         Then, Dorn Lesches, MD will plan to see you again in 12 month(s).    We recommend signing up for the patient portal called MyChart.  Sign up information is provided on this After Visit Summary.  MyChart is used to connect with patients for Virtual Visits (Telemedicine).  Patients are able to view lab/test results, encounter notes, upcoming appointments, etc.  Non-urgent messages can be sent to your provider as well.   To learn more about what you can do with MyChart, go to [email or web address].   Other Instructions

## 2024-09-03 NOTE — Assessment & Plan Note (Signed)
 History of hyperlipidemia on high-dose statin therapy with lipid profile performed 08/31/2023 revealing total cholesterol 101, LDL 38 and HDL 34.

## 2024-09-16 ENCOUNTER — Other Ambulatory Visit: Payer: Self-pay

## 2024-09-16 ENCOUNTER — Emergency Department (HOSPITAL_COMMUNITY)

## 2024-09-16 ENCOUNTER — Observation Stay (HOSPITAL_COMMUNITY)
Admission: EM | Admit: 2024-09-16 | Discharge: 2024-09-18 | Disposition: A | Attending: Family Medicine | Admitting: Family Medicine

## 2024-09-16 DIAGNOSIS — K922 Gastrointestinal hemorrhage, unspecified: Secondary | ICD-10-CM | POA: Diagnosis present

## 2024-09-16 DIAGNOSIS — Z8719 Personal history of other diseases of the digestive system: Secondary | ICD-10-CM

## 2024-09-16 DIAGNOSIS — Z8546 Personal history of malignant neoplasm of prostate: Secondary | ICD-10-CM | POA: Insufficient documentation

## 2024-09-16 DIAGNOSIS — Z7982 Long term (current) use of aspirin: Secondary | ICD-10-CM | POA: Insufficient documentation

## 2024-09-16 DIAGNOSIS — E785 Hyperlipidemia, unspecified: Secondary | ICD-10-CM | POA: Insufficient documentation

## 2024-09-16 DIAGNOSIS — I2510 Atherosclerotic heart disease of native coronary artery without angina pectoris: Secondary | ICD-10-CM | POA: Insufficient documentation

## 2024-09-16 DIAGNOSIS — Z8739 Personal history of other diseases of the musculoskeletal system and connective tissue: Secondary | ICD-10-CM | POA: Insufficient documentation

## 2024-09-16 DIAGNOSIS — K5731 Diverticulosis of large intestine without perforation or abscess with bleeding: Principal | ICD-10-CM | POA: Insufficient documentation

## 2024-09-16 DIAGNOSIS — K5791 Diverticulosis of intestine, part unspecified, without perforation or abscess with bleeding: Principal | ICD-10-CM

## 2024-09-16 LAB — COMPREHENSIVE METABOLIC PANEL WITH GFR
ALT: 24 U/L (ref 0–44)
AST: 32 U/L (ref 15–41)
Albumin: 4.3 g/dL (ref 3.5–5.0)
Alkaline Phosphatase: 74 U/L (ref 38–126)
Anion gap: 10 (ref 5–15)
BUN: 16 mg/dL (ref 8–23)
CO2: 28 mmol/L (ref 22–32)
Calcium: 9.7 mg/dL (ref 8.9–10.3)
Chloride: 100 mmol/L (ref 98–111)
Creatinine, Ser: 0.96 mg/dL (ref 0.61–1.24)
GFR, Estimated: >60 mL/min
Glucose, Bld: 95 mg/dL (ref 70–99)
Potassium: 4.2 mmol/L (ref 3.5–5.1)
Sodium: 138 mmol/L (ref 135–145)
Total Bilirubin: 0.4 mg/dL (ref 0.0–1.2)
Total Protein: 7.2 g/dL (ref 6.5–8.1)

## 2024-09-16 LAB — PROTIME-INR
INR: 1 (ref 0.8–1.2)
Prothrombin Time: 13.6 s (ref 11.4–15.2)

## 2024-09-16 LAB — TYPE AND SCREEN
ABO/RH(D): A POS
Antibody Screen: NEGATIVE

## 2024-09-16 LAB — CBC
HCT: 50 % (ref 39.0–52.0)
Hemoglobin: 16.9 g/dL (ref 13.0–17.0)
MCH: 31.5 pg (ref 26.0–34.0)
MCHC: 33.8 g/dL (ref 30.0–36.0)
MCV: 93.3 fL (ref 80.0–100.0)
Platelets: 345 10*3/uL (ref 150–400)
RBC: 5.36 MIL/uL (ref 4.22–5.81)
RDW: 12.7 % (ref 11.5–15.5)
WBC: 6.9 10*3/uL (ref 4.0–10.5)
nRBC: 0 % (ref 0.0–0.2)

## 2024-09-16 MED ORDER — IOHEXOL 350 MG/ML SOLN
100.0000 mL | Freq: Once | INTRAVENOUS | Status: AC | PRN
  Administered 2024-09-16: 100 mL via INTRAVENOUS

## 2024-09-16 MED ORDER — SODIUM CHLORIDE 0.9 % IV BOLUS
1000.0000 mL | Freq: Once | INTRAVENOUS | Status: AC
  Administered 2024-09-16: 1000 mL via INTRAVENOUS

## 2024-09-16 NOTE — ED Triage Notes (Signed)
 Pt states blood clots in stool today, starting at 1 pm.  2 more in the last hour.  States takes 1 81 mg asa per day.  2 previous GI bleeds when on blood thinners.

## 2024-09-16 NOTE — ED Triage Notes (Signed)
 Pt reports concern for rectal bleeding which started at 1 pm. Has had 3 bloody BM since described as dark red with multiple blood clots. No blood thinner. Takes 81 mg ASA. No NV. Has had similar episodes in the past when he was on plavix .

## 2024-09-16 NOTE — ED Provider Notes (Signed)
 " [place] EMERGENCY DEPARTMENT AT [facility] Provider Note   CSN: [id number] Arrival date & time: 09/16/24  2101     Patient presents with: Rectal Bleeding   Kevin Wallace is a 64 y.o. male.  {Add pertinent medical, surgical, social history, OB history to YEP:67052} Patient to ED with symptoms of recurrent GI bleeding that started today. He describes passing dark red blood including clots. He has some abdominal discomfort just prior to having another episode, otherwise no abdominal pain. Similar symptoms in 2023 when on Brilinta  and 2024 when on Plavix , diagnosed as diverticular bleeding. He is now only on aspirin . Embolization attempted last year but was ultimately not needed as bleeding stopped spontaneously. No history of blood transfusions. He reports minimal dizziness and no syncope or near syncope. No nausea or vomiting.   The history is provided by the patient. No language interpreter was used.  Rectal Bleeding      Prior to Admission medications  Medication Sig Start Date End Date Taking? Authorizing Provider  allopurinol  (ZYLOPRIM ) 300 MG tablet Take 300 mg by mouth daily. 11/28/19   [provider]  aspirin  81 MG chewable tablet Chew by mouth daily.    [provider]  metoprolol  succinate (TOPROL -XL) 25 MG 24 hr tablet Take 0.5 tablets (12.5 mg total) by mouth daily. Take with a meal. 10/10/23   Court Dorn PARAS, MD  nitroGLYCERIN  (NITROSTAT ) 0.4 MG SL tablet Place 1 tablet (0.4 mg total) under the tongue every 5 (five) minutes x 3 doses as needed for chest pain. 09/01/21   Bhagat, Aleene, PA  rosuvastatin  (CRESTOR ) 40 MG tablet Take 1 tablet (40 mg total) by mouth at bedtime. 06/28/22   Jerrie Aleene, PA    Allergies: Colchicine    Review of Systems  Gastrointestinal:  Positive for hematochezia.    Updated Vital Signs BP (!) 140/78 (BP Location: Right Arm)   Pulse (!) 52   Temp 98.1 F (36.7 C)   Resp 15   Ht 6' (1.829 m)    Wt 88.5 kg   SpO2 97%   BMI 26.45 kg/m   Physical Exam Vitals and nursing note reviewed.  Constitutional:      Appearance: Normal appearance.  Eyes:     Comments: No conjunctival pallor.  Cardiovascular:     Rate and Rhythm: Normal rate.  Pulmonary:     Effort: Pulmonary effort is normal.  Abdominal:     General: There is no distension.     Palpations: Abdomen is soft.     Tenderness: There is no abdominal tenderness.  Genitourinary:    Comments: Dried blood at rectum. Musculoskeletal:        General: Normal range of motion.  Skin:    General: Skin is warm and dry.  Neurological:     Mental Status: He is alert and oriented to person, place, and time.     (all labs ordered are listed, but only abnormal results are displayed) Labs Reviewed  COMPREHENSIVE METABOLIC PANEL WITH GFR  CBC  PROTIME-INR  POC OCCULT BLOOD, ED  TYPE AND SCREEN    EKG: None  Radiology: No results found.  {Document cardiac monitor, telemetry assessment procedure when appropriate:32947} Procedures   Medications Ordered in the ED - No data to display  Clinical Course as of 09/16/24 2242  Sun Sep 16, 2024  2239 Patient to ED with rectal bleeding that started today. Chart reviewed.  [SU]    Clinical Course User Index [SU] Odell Balls, PA-C   {  Click here for ABCD2, HEART and other calculators REFRESH Note before signing:1}                              Medical Decision Making Amount and/or Complexity of Data Reviewed Labs: ordered. Radiology: ordered.   ***  {Document critical care time when appropriate  Document review of labs and clinical decision tools ie CHADS2VASC2, etc  Document your independent review of radiology images and any outside records  Document your discussion with family members, caretakers and with consultants  Document social determinants of health affecting pt's care  Document your decision making why or why not admission, treatments were needed:32947:::1}    Final diagnoses:  None    ED Discharge Orders     None        "

## 2024-09-16 NOTE — ED Notes (Signed)
 Patient transported to CT

## 2024-09-17 ENCOUNTER — Encounter (HOSPITAL_COMMUNITY): Payer: Self-pay | Admitting: Internal Medicine

## 2024-09-17 ENCOUNTER — Other Ambulatory Visit: Payer: Self-pay | Admitting: Cardiovascular Disease

## 2024-09-17 DIAGNOSIS — K922 Gastrointestinal hemorrhage, unspecified: Secondary | ICD-10-CM | POA: Diagnosis present

## 2024-09-17 DIAGNOSIS — K5731 Diverticulosis of large intestine without perforation or abscess with bleeding: Secondary | ICD-10-CM | POA: Diagnosis not present

## 2024-09-17 DIAGNOSIS — Z860101 Personal history of adenomatous and serrated colon polyps: Secondary | ICD-10-CM | POA: Diagnosis not present

## 2024-09-17 DIAGNOSIS — Z8679 Personal history of other diseases of the circulatory system: Secondary | ICD-10-CM

## 2024-09-17 DIAGNOSIS — E782 Mixed hyperlipidemia: Secondary | ICD-10-CM

## 2024-09-17 DIAGNOSIS — Z8739 Personal history of other diseases of the musculoskeletal system and connective tissue: Secondary | ICD-10-CM

## 2024-09-17 LAB — CBC WITH DIFFERENTIAL/PLATELET
Abs Immature Granulocytes: 0.01 10*3/uL (ref 0.00–0.07)
Basophils Absolute: 0 10*3/uL (ref 0.0–0.1)
Basophils Relative: 1 %
Eosinophils Absolute: 0.1 10*3/uL (ref 0.0–0.5)
Eosinophils Relative: 1 %
HCT: 42.4 % (ref 39.0–52.0)
Hemoglobin: 14.3 g/dL (ref 13.0–17.0)
Immature Granulocytes: 0 %
Lymphocytes Relative: 27 %
Lymphs Abs: 1.8 10*3/uL (ref 0.7–4.0)
MCH: 31.2 pg (ref 26.0–34.0)
MCHC: 33.7 g/dL (ref 30.0–36.0)
MCV: 92.6 fL (ref 80.0–100.0)
Monocytes Absolute: 0.8 10*3/uL (ref 0.1–1.0)
Monocytes Relative: 12 %
Neutro Abs: 4 10*3/uL (ref 1.7–7.7)
Neutrophils Relative %: 59 %
Platelets: 289 10*3/uL (ref 150–400)
RBC: 4.58 MIL/uL (ref 4.22–5.81)
RDW: 12.9 % (ref 11.5–15.5)
WBC: 6.7 10*3/uL (ref 4.0–10.5)
nRBC: 0 % (ref 0.0–0.2)

## 2024-09-17 LAB — HEMOGLOBIN AND HEMATOCRIT, BLOOD
HCT: 43.3 % (ref 39.0–52.0)
Hemoglobin: 14.6 g/dL (ref 13.0–17.0)

## 2024-09-17 LAB — HIV ANTIBODY (ROUTINE TESTING W REFLEX): HIV Screen 4th Generation wRfx: NONREACTIVE

## 2024-09-17 LAB — MAGNESIUM: Magnesium: 1.8 mg/dL (ref 1.7–2.4)

## 2024-09-17 LAB — PHOSPHORUS: Phosphorus: 2.7 mg/dL (ref 2.5–4.6)

## 2024-09-17 MED ORDER — ONDANSETRON HCL 4 MG/2ML IJ SOLN: 4.0000 mg | Freq: Four times a day (QID) | INTRAMUSCULAR | Status: DC | PRN

## 2024-09-17 MED ORDER — ACETAMINOPHEN 325 MG PO TABS: 650.0000 mg | ORAL_TABLET | Freq: Four times a day (QID) | ORAL | Status: DC | PRN

## 2024-09-17 MED ORDER — ACETAMINOPHEN 650 MG RE SUPP: 650.0000 mg | Freq: Four times a day (QID) | RECTAL | Status: DC | PRN

## 2024-09-17 MED ORDER — ONDANSETRON HCL 4 MG PO TABS: 4.0000 mg | ORAL_TABLET | Freq: Four times a day (QID) | ORAL | Status: DC | PRN

## 2024-09-18 DIAGNOSIS — Z8719 Personal history of other diseases of the digestive system: Secondary | ICD-10-CM

## 2024-09-18 DIAGNOSIS — K922 Gastrointestinal hemorrhage, unspecified: Secondary | ICD-10-CM | POA: Diagnosis not present

## 2024-09-18 LAB — COMPREHENSIVE METABOLIC PANEL WITH GFR
ALT: 17 U/L (ref 0–44)
AST: 26 U/L (ref 15–41)
Albumin: 3.6 g/dL (ref 3.5–5.0)
Alkaline Phosphatase: 57 U/L (ref 38–126)
Anion gap: 9 (ref 5–15)
BUN: 16 mg/dL (ref 8–23)
CO2: 26 mmol/L (ref 22–32)
Calcium: 8.6 mg/dL — ABNORMAL LOW (ref 8.9–10.3)
Chloride: 103 mmol/L (ref 98–111)
Creatinine, Ser: 0.78 mg/dL (ref 0.61–1.24)
GFR, Estimated: >60 mL/min
Glucose, Bld: 74 mg/dL (ref 70–99)
Potassium: 3.9 mmol/L (ref 3.5–5.1)
Sodium: 138 mmol/L (ref 135–145)
Total Bilirubin: 0.5 mg/dL (ref 0.0–1.2)
Total Protein: 6 g/dL — ABNORMAL LOW (ref 6.5–8.1)

## 2024-09-18 LAB — CBC
HCT: 39.8 % (ref 39.0–52.0)
Hemoglobin: 13.6 g/dL (ref 13.0–17.0)
MCH: 31.1 pg (ref 26.0–34.0)
MCHC: 34.2 g/dL (ref 30.0–36.0)
MCV: 91.1 fL (ref 80.0–100.0)
Platelets: 284 10*3/uL (ref 150–400)
RBC: 4.37 MIL/uL (ref 4.22–5.81)
RDW: 12.8 % (ref 11.5–15.5)
WBC: 7 10*3/uL (ref 4.0–10.5)
nRBC: 0 % (ref 0.0–0.2)

## 2024-09-18 NOTE — Progress Notes (Signed)
 Discharge instructions given to pt. Pt verbalized understanding of all teaching and had no further questions. Let him know also Do not take aspirin  for the next 3 days.  You can start taking from 09/21/2024.  Start taking metoprolol  from tomorrow morning. Pt verbalized understanding

## 2024-09-18 NOTE — Plan of Care (Signed)
   Problem: Health Behavior/Discharge Planning: Goal: Ability to manage health-related needs will improve Outcome: Progressing   Problem: Nutrition: Goal: Adequate nutrition will be maintained Outcome: Progressing

## 2024-11-08 ENCOUNTER — Ambulatory Visit: Admitting: Nurse Practitioner
